# Patient Record
Sex: Female | Born: 1975
Health system: Southern US, Community
[De-identification: ages and names within clinical notes are randomized; demographics above are authoritative.]

## PROBLEM LIST (undated history)

## (undated) DIAGNOSIS — R7303 Prediabetes: Secondary | ICD-10-CM

## (undated) DIAGNOSIS — K589 Irritable bowel syndrome without diarrhea: Secondary | ICD-10-CM

## (undated) DIAGNOSIS — M2550 Pain in unspecified joint: Secondary | ICD-10-CM

## (undated) DIAGNOSIS — T7840XA Allergy, unspecified, initial encounter: Secondary | ICD-10-CM

## (undated) DIAGNOSIS — J45909 Unspecified asthma, uncomplicated: Secondary | ICD-10-CM

## (undated) DIAGNOSIS — R609 Edema, unspecified: Secondary | ICD-10-CM

## (undated) DIAGNOSIS — H409 Unspecified glaucoma: Secondary | ICD-10-CM

## (undated) DIAGNOSIS — N920 Excessive and frequent menstruation with regular cycle: Secondary | ICD-10-CM

## (undated) DIAGNOSIS — S83209A Unspecified tear of unspecified meniscus, current injury, unspecified knee, initial encounter: Secondary | ICD-10-CM

## (undated) DIAGNOSIS — R002 Palpitations: Secondary | ICD-10-CM

## (undated) DIAGNOSIS — M549 Dorsalgia, unspecified: Secondary | ICD-10-CM

## (undated) DIAGNOSIS — D649 Anemia, unspecified: Secondary | ICD-10-CM

## (undated) DIAGNOSIS — M722 Plantar fascial fibromatosis: Secondary | ICD-10-CM

## (undated) DIAGNOSIS — K5900 Constipation, unspecified: Secondary | ICD-10-CM

## (undated) DIAGNOSIS — R011 Cardiac murmur, unspecified: Secondary | ICD-10-CM

## (undated) HISTORY — DX: Unspecified tear of unspecified meniscus, current injury, unspecified knee, initial encounter: S83.209A

## (undated) HISTORY — PX: ERCP: SHX60

## (undated) HISTORY — DX: Anemia, unspecified: D64.9

## (undated) HISTORY — DX: Constipation, unspecified: K59.00

## (undated) HISTORY — DX: Excessive and frequent menstruation with regular cycle: N92.0

## (undated) HISTORY — DX: Dorsalgia, unspecified: M54.9

## (undated) HISTORY — DX: Edema, unspecified: R60.9

## (undated) HISTORY — DX: Unspecified asthma, uncomplicated: J45.909

## (undated) HISTORY — DX: Cardiac murmur, unspecified: R01.1

## (undated) HISTORY — DX: Plantar fascial fibromatosis: M72.2

## (undated) HISTORY — DX: Palpitations: R00.2

## (undated) HISTORY — DX: Prediabetes: R73.03

## (undated) HISTORY — DX: Irritable bowel syndrome, unspecified: K58.9

## (undated) HISTORY — DX: Pain in unspecified joint: M25.50

## (undated) HISTORY — PX: DILATION AND CURETTAGE OF UTERUS: SHX78

## (undated) HISTORY — PX: EYE SURGERY: SHX253

## (undated) HISTORY — PX: MENISCUS REPAIR: SHX5179

## (undated) HISTORY — DX: Allergy, unspecified, initial encounter: T78.40XA

---

## 2003-04-21 HISTORY — PX: CHOLECYSTECTOMY: SHX55

## 2015-08-19 DIAGNOSIS — D649 Anemia, unspecified: Secondary | ICD-10-CM

## 2015-08-19 HISTORY — DX: Anemia, unspecified: D64.9

## 2016-04-17 DIAGNOSIS — R300 Dysuria: Secondary | ICD-10-CM | POA: Diagnosis not present

## 2016-04-17 DIAGNOSIS — N3010 Interstitial cystitis (chronic) without hematuria: Secondary | ICD-10-CM | POA: Diagnosis not present

## 2016-04-17 DIAGNOSIS — Z8719 Personal history of other diseases of the digestive system: Secondary | ICD-10-CM | POA: Diagnosis not present

## 2016-04-25 ENCOUNTER — Encounter: Payer: Self-pay | Admitting: Emergency Medicine

## 2016-04-25 ENCOUNTER — Ambulatory Visit
Admission: EM | Admit: 2016-04-25 | Discharge: 2016-04-25 | Disposition: A | Discharge status: Home / Self Care | Payer: 59 | Attending: Emergency Medicine | Admitting: Emergency Medicine

## 2016-04-25 DIAGNOSIS — N390 Urinary tract infection, site not specified: Secondary | ICD-10-CM | POA: Diagnosis not present

## 2016-04-25 DIAGNOSIS — B373 Candidiasis of vulva and vagina: Secondary | ICD-10-CM

## 2016-04-25 DIAGNOSIS — B3731 Acute candidiasis of vulva and vagina: Secondary | ICD-10-CM

## 2016-04-25 HISTORY — DX: Unspecified glaucoma: H40.9

## 2016-04-25 LAB — CHLAMYDIA/NGC RT PCR (ARMC ONLY)
CHLAMYDIA TR: NOT DETECTED
N GONORRHOEAE: NOT DETECTED

## 2016-04-25 LAB — URINALYSIS, COMPLETE (UACMP) WITH MICROSCOPIC
BILIRUBIN URINE: NEGATIVE
Glucose, UA: NEGATIVE mg/dL
HGB URINE DIPSTICK: NEGATIVE
KETONES UR: NEGATIVE mg/dL
Nitrite: NEGATIVE
PROTEIN: NEGATIVE mg/dL
RBC / HPF: NONE SEEN RBC/hpf (ref 0–5)
SPECIFIC GRAVITY, URINE: 1.02 (ref 1.005–1.030)
pH: 7 (ref 5.0–8.0)

## 2016-04-25 LAB — WET PREP, GENITAL
Clue Cells Wet Prep HPF POC: NONE SEEN
Sperm: NONE SEEN
Trich, Wet Prep: NONE SEEN

## 2016-04-25 LAB — PREGNANCY, URINE: PREG TEST UR: NEGATIVE

## 2016-04-25 MED ORDER — SULFAMETHOXAZOLE-TRIMETHOPRIM 800-160 MG PO TABS: 1.0000 | ORAL_TABLET | Freq: Two times a day (BID) | ORAL | 0 refills | Status: AC

## 2016-04-25 MED ORDER — FLUCONAZOLE 150 MG PO TABS: 150.0000 mg | ORAL_TABLET | Freq: Every day | ORAL | 0 refills | Status: DC

## 2016-04-25 NOTE — ED Provider Notes (Signed)
[facility] ____________________________________________  Time seen: Approximately 7:37 PM  I have reviewed the triage vital signs and the nursing notes.   HISTORY  Chief Complaint Vaginal Discharge and Vaginal Itching   HPI Angela [name] is a 41 y.o. female presenting for the complaints of vaginal irritation, vaginal itching intermittent vaginal discharge. Patient for several symptoms have been present for the last 3 weeks. Patient reports approximately 2-3 weeks ago she was seen at a different urgent care and was diagnosed with urinary tract infection and bacterial vaginosis. Patient reports that she was treated with Macrobid and Flagyl. Patient reports symptoms improved but never fully resolved. Patient reports that she then had follow-up and her UTI had cleared that same week. Patient states vaginal area feels irritated. Reports has been sexually active, but denies sexual activity triggering symptoms. Reports sexually active with same partner for the last 10 years. Denies concerns of STDs. Denies abdominal pain, pelvic pain, back pain or fevers. Reports some suprapubic pressure but denies pain. Some urination irritation but denies frequency or urgency. Denies abnormal bleeding. Denies constipation or diarrhea.  Patient reports otherwise feels well. Denies other complaints.  Patient's last menstrual period was 04/06/2016 (exact date). Denies pregnancy.   Past Medical History:  Diagnosis Date  . Glaucoma     There are no active problems to display for this patient.   Past Surgical History:  Procedure Laterality Date  . EYE SURGERY      Current Outpatient Rx  . Order #: [id number] Class: Historical Med  . Order #: [id number] Class: Historical Med  . Order #: [id number] Class: Historical Med  . Order #: [id number] Class: Historical Med  . Order #: [id number] Class: Normal  . Order #: [id number] Class: Normal    No current facility-administered medications for  this encounter.   Current Outpatient Prescriptions:  .  docusate sodium (COLACE) 100 MG capsule, Take 100 mg by mouth 2 (two) times daily as needed for mild constipation., Disp: , Rfl:  .  fluticasone (FLONASE) 50 MCG/ACT nasal spray, Place 1 spray into both nostrils daily., Disp: , Rfl:  .  levobunolol (BETAGAN) 0.5 % ophthalmic solution, Place 1 drop into the right eye 2 (two) times daily., Disp: , Rfl:  .  naproxen (NAPROSYN) 500 MG tablet, Take 500 mg by mouth 2 (two) times daily with a meal., Disp: , Rfl:  .  fluconazole (DIFLUCAN) 150 MG tablet, Take 1 tablet (150 mg total) by mouth daily. Take one pill orally, then Repeat in 72 hours as needed., Disp: 2 tablet, Rfl: 0 .  sulfamethoxazole-trimethoprim (BACTRIM DS,SEPTRA DS) 800-160 MG tablet, Take 1 tablet by mouth 2 (two) times daily., Disp: 10 tablet, Rfl: 0  Allergies Patient has no known allergies.  History reviewed. No pertinent family history.  Social History Social History  Substance Use Topics  . Smoking status: Never Smoker  . Smokeless tobacco: Never Used  . Alcohol use Yes    Review of Systems Constitutional: No fever/chills Eyes: No visual changes. ENT: No sore throat. Cardiovascular: Denies chest pain. Respiratory: Denies shortness of breath. Gastrointestinal: No abdominal pain.  No nausea, no vomiting.  No diarrhea.  No constipation. Genitourinary: Negative for dysuria. As above. Musculoskeletal: Negative for back pain. Skin: Negative for rash. Neurological: Negative for headaches, focal weakness or numbness.  10-point ROS otherwise negative.  ____________________________________________   PHYSICAL EXAM:  VITAL SIGNS: ED Triage Vitals  Enc Vitals Group     BP 04/25/16 1307 127/89     Pulse Rate 04/25/16 1307 88  Resp 04/25/16 1307 16     Temp 04/25/16 1307 98.2 F (36.8 C)     Temp Source 04/25/16 1307 Oral     SpO2 04/25/16 1307 100 %     Weight 04/25/16 1304 210 lb (95.3 kg)     Height  04/25/16 1304 5\' 9"  (1.753 m)     Head Circumference --      Peak Flow --      Pain Score 04/25/16 1307 2     Pain Loc --      Pain Edu? --      Excl. in [place]? --     Constitutional: Alert and oriented. Well appearing and in no acute distress. Eyes: Conjunctivae are normal. PERRL. EOMI. ENT      Head: Normocephalic and atraumatic.      Mouth/Throat: Mucous membranes are moist. Cardiovascular: Normal rate, regular rhythm. Grossly normal heart sounds.  Good peripheral circulation. Respiratory: Normal respiratory effort without tachypnea nor retractions. Breath sounds are clear and equal bilaterally. No wheezes/rales/rhonchi.. Gastrointestinal: Soft and nontender. No distention.No CVA tenderness. Pelvic: Pelvic exam completed with Melissa CMA at bedside as chaperone. External: Normal appearance, no rash or lesion. Speculum: Mild whitish vaginal discharge, no bleeding, cervical Oz closed. No foreign bodies. Bimanual: Nontender, no cervical or adnexal tenderness. Musculoskeletal:  Ambulatory with steady gait.Marland Kitchen No midline cervical, thoracic or lumbar tenderness to palpation.  Neurologic:  Normal speech and language. Speech is normal. No gait instability.  Skin:  Skin is warm, dry and intact. No rash noted. Psychiatric: Mood and affect are normal. Speech and behavior are normal. Patient exhibits appropriate insight and judgment   ___________________________________________   LABS (all labs ordered are listed, but only abnormal results are displayed)  Labs Reviewed  WET PREP, GENITAL - Abnormal; Notable for the following:       Result Value   Yeast Wet Prep HPF POC PRESENT (*)    WBC, Wet Prep HPF POC MODERATE (*)    All other components within normal limits  URINALYSIS, COMPLETE (UACMP) WITH MICROSCOPIC - Abnormal; Notable for the following:    APPearance HAZY (*)    Leukocytes, UA SMALL (*)    Squamous Epithelial / LPF 6-30 (*)    Bacteria, UA MANY (*)    All other components within  normal limits  CHLAMYDIA/NGC RT PCR ([facility] ONLY)  URINE CULTURE  PREGNANCY, URINE    PROCEDURES Procedures   INITIAL IMPRESSION / ASSESSMENT AND PLAN / ED COURSE  Pertinent labs & imaging results that were available during my care of the patient were reviewed by me and considered in my medical decision making (see chart for details).  Well-appearing patient. No acute distress. Suspect vaginitis. Wet prep completed. Will also evaluate urinalysis.  Urinalysis reviewed in concerning for urinary tract infection. Wet prep positive for yeast as well as yeast noted in urine. Patient agreed to [facility] and chlamydia testing as well and culture urine. Discussed with patient need to have follow-up as no clear trigger per patient as well as return of symptoms. Will treat patient with oral Bactrim and Diflucan. Encouraged pelvic rest, cotton underwear, no douching, and avoidance of triggers. Discussed follow up with PCP or OB/GYN. Local information OB/GYN given. Discussed indication, risks and benefits of medications with patient.  Discussed follow up with Primary care physician this week. Discussed follow up and return parameters including no resolution or any worsening concerns. Patient verbalized understanding and agreed to plan.   ____________________________________________   FINAL CLINICAL IMPRESSION(S) / ED  DIAGNOSES  Final diagnoses:  Urinary tract infection without hematuria, site unspecified  Yeast vaginitis     Discharge Medication List as of 04/25/2016  3:44 PM    START taking these medications   Details  fluconazole (DIFLUCAN) 150 MG tablet Take 1 tablet (150 mg total) by mouth daily. Take one pill orally, then Repeat in 72 hours as needed., Starting Sat 04/25/2016, Normal    sulfamethoxazole-trimethoprim (BACTRIM DS,SEPTRA DS) 800-160 MG tablet Take 1 tablet by mouth 2 (two) times daily., Starting Sat 04/25/2016, Until Thu 04/30/2016, Normal        Note: This dictation was  prepared with Dragon dictation along with smaller phrase technology. Any transcriptional errors that result from this process are unintentional.    Clinical Course       Marylene Land, NP 04/25/16 1943

## 2016-04-25 NOTE — ED Triage Notes (Signed)
Patient c/o vaginal discharge and itching for 3 weeks.  Patient was diagnosed with BV and UTI and was treated for both.  Patient states that her symptoms started back about 2 weeks ago.  Patient reports white vaginal discharge.

## 2016-04-25 NOTE — Discharge Instructions (Signed)
Take medication as prescribed. Rest. Drink plenty of fluids.  ° °Follow up with your primary care physician this week as needed. Return to Urgent care for new or worsening concerns.  ° °

## 2016-04-26 ENCOUNTER — Telehealth: Payer: Self-pay | Admitting: *Deleted

## 2016-04-26 NOTE — Telephone Encounter (Signed)
Called patient, verified DOB, communicated negative gonorrhea and chlamydia test results. Patient confirmed understanding of test results.

## 2016-04-27 LAB — URINE CULTURE

## 2016-07-08 ENCOUNTER — Encounter: Payer: Self-pay | Admitting: Obstetrics and Gynecology

## 2016-07-08 ENCOUNTER — Ambulatory Visit (INDEPENDENT_AMBULATORY_CARE_PROVIDER_SITE_OTHER): Payer: 59 | Admitting: Obstetrics and Gynecology

## 2016-07-08 VITALS — BP 124/78 | Ht 69.0 in | Wt 213.0 lb

## 2016-07-08 DIAGNOSIS — Z01419 Encounter for gynecological examination (general) (routine) without abnormal findings: Secondary | ICD-10-CM | POA: Diagnosis not present

## 2016-07-08 DIAGNOSIS — N898 Other specified noninflammatory disorders of vagina: Secondary | ICD-10-CM

## 2016-07-08 DIAGNOSIS — Z124 Encounter for screening for malignant neoplasm of cervix: Secondary | ICD-10-CM

## 2016-07-08 LAB — POCT WET PREP WITH KOH
CLUE CELLS WET PREP PER HPF POC: NEGATIVE
PH: 4.5
TRICHOMONAS UA: NEGATIVE
Yeast Wet Prep HPF POC: NEGATIVE

## 2016-07-08 NOTE — Progress Notes (Signed)
Gynecology Annual Exam  PCP: No PCP Per Patient  Chief Complaint  Patient presents with  . Annual Exam    History of Present Illness:  [name] is a 41 y.o. G0P0000 who LMP was Patient's last menstrual period was 07/02/2016., presents today for her annual examination.  Her menses are regular every 28-30 days, lasting 5 day(s).  Dysmenorrhea none. She does not have intermenstrual bleeding.  She is single partner, contraception - none and reports her husband is infertile. No issues with intercourse.   Last Pap: 1 year ago, ASCUS, HPV negative Hx of STDs: none  Last mammogram: 1 year, Birads 1 There is no FH of breast cancer. There is no FH of ovarian cancer. The patient does not do self-breast exams.  Colonoscopy: n/a DEXA: n/a  Tobacco use: The patient denies current or previous tobacco use. Alcohol use: social drinker Exercise: moderately active  Reports a stinging/burning with urinating. Treated for UTI in January, had yeast infection after. That was treated. No other urinary symptoms today.  The patient is sexually active. The patient wears seatbelts: yes.     Review of Systems: Review of Systems  Constitutional: Negative.   HENT: Negative.   Respiratory: Negative.   Cardiovascular: Negative.   Gastrointestinal: Negative.   Genitourinary: Positive for dysuria.  Musculoskeletal: Negative.   Skin: Negative.   Neurological: Negative.   Psychiatric/Behavioral: Negative.     Past Medical History:  Diagnosis Date  . Glaucoma    Past Surgical History:  Procedure Laterality Date  . EYE SURGERY      Medications:   Medication Sig Start Date End Date Taking? Authorizing Provider  fluticasone (FLONASE) 50 MCG/ACT nasal spray Place 1 spray into both nostrils daily.   Yes Historical Provider, MD  levobunolol (BETAGAN) 0.5 % ophthalmic solution Place 1 drop into the right eye 2 (two) times daily.   Yes Historical Provider, MD  docusate sodium (COLACE)  100 MG capsule Take 100 mg by mouth 2 (two) times daily as needed for mild constipation.    Historical Provider, MD  fluconazole (DIFLUCAN) 150 MG tablet Take 1 tablet (150 mg total) by mouth daily. Take one pill orally, then Repeat in 72 hours as needed. Patient not taking: Reported on 07/08/2016 04/25/16   Marylene Land, NP  naproxen (NAPROSYN) 500 MG tablet Take 500 mg by mouth 2 (two) times daily with a meal.    Historical Provider, MD    Allergies: No Known Allergies  Gynecologic History:  Patient's last menstrual period was 07/02/2016. Contraception: reports husband is sterile  Obstetric History: G0P0000  Family History  Problem Relation Age of Onset  . Colon cancer Mother   . Diabetes Mother   . Hypertension Mother   . Diabetes Father   . Esophageal cancer Maternal Grandmother    Social History   Social History  . Marital status: Married    Spouse name: N/A  . Number of children: N/A  . Years of education: N/A   Occupational History  . Not on file.   Social History Main Topics  . Smoking status: Never Smoker  . Smokeless tobacco: Never Used  . Alcohol use Yes  . Drug use: No  . Sexual activity: Yes    Birth control/ protection: None   Other Topics Concern  . Not on file   Social History Narrative  . No narrative on file    Physical Exam Vitals: Blood pressure 124/78, height 5\' 9"  (1.753 m), weight 213 lb (96.6 kg),  last menstrual period 07/02/2016. Physical Exam  Constitutional: She is oriented to person, place, and time and well-developed, well-nourished, and in no distress. No distress.  HENT:  Head: Normocephalic and atraumatic.  Eyes: Conjunctivae are normal. Left eye exhibits no discharge. No scleral icterus.  Neck: Normal range of motion. Neck supple.  Cardiovascular: Normal rate and regular rhythm.  Exam reveals no gallop and no friction rub.   No murmur heard. Pulmonary/Chest: Effort normal and breath sounds normal. No respiratory distress. She  has no wheezes. She has no rales.  Abdominal: Soft. Bowel sounds are normal. She exhibits no distension and no mass. There is no tenderness. There is no rebound and no guarding.  Genitourinary: Vagina normal, uterus normal, cervix normal, right adnexa normal, left adnexa normal and vulva normal.  Musculoskeletal: Normal range of motion. She exhibits no edema.  Lymphadenopathy:    She has no cervical adenopathy.  Neurological: She is alert and oriented to person, place, and time. No cranial nerve deficit.  Skin: Skin is warm and dry. No rash noted.  Psychiatric: Judgment normal.     Female chaperone present for pelvic and breast  portions of the physical exam  Results: AUDIT Questionnaire (screen for alcoholism): 2 PHQ-9: 1   Wet Prep: PH: 4.5 Clue cells: absent Fungal elements: absent Trichomonas: absent  Assessment: 41 y.o. G0P0000 here for annual gynecologic exam, doing well.  Plan: 1) Mammogram - recommend yearly screening mammogram  2) STI screening was offered performed  3) ASCCP guidelines and rational discussed.  Patient opts for routine screening interval. Pap smear performed today.  4) Contraception - she understands that while her husband has low motility, she may be at risk for pregnancy still. She is fine with that.   5) Routine healthcare maintenance including cholesterol, diabetes screening no indicated today.   Prentice Docker, MD 07/08/2016 10:33 AM

## 2016-07-13 ENCOUNTER — Encounter: Payer: Self-pay | Admitting: Obstetrics and Gynecology

## 2016-07-13 LAB — IGP, APTIMA HPV, RFX 16/18,45
HPV APTIMA: NEGATIVE
PAP Smear Comment: 0

## 2016-08-31 ENCOUNTER — Encounter: Payer: Self-pay | Admitting: Family Medicine

## 2016-08-31 ENCOUNTER — Ambulatory Visit (INDEPENDENT_AMBULATORY_CARE_PROVIDER_SITE_OTHER): Payer: 59 | Admitting: Family Medicine

## 2016-08-31 VITALS — BP 118/82 | HR 76 | Temp 98.4°F | Ht 68.0 in | Wt 218.2 lb

## 2016-08-31 DIAGNOSIS — E669 Obesity, unspecified: Secondary | ICD-10-CM | POA: Diagnosis not present

## 2016-08-31 DIAGNOSIS — R002 Palpitations: Secondary | ICD-10-CM

## 2016-08-31 DIAGNOSIS — D500 Iron deficiency anemia secondary to blood loss (chronic): Secondary | ICD-10-CM

## 2016-08-31 DIAGNOSIS — L83 Acanthosis nigricans: Secondary | ICD-10-CM

## 2016-08-31 DIAGNOSIS — R220 Localized swelling, mass and lump, head: Secondary | ICD-10-CM

## 2016-08-31 LAB — CBC
HEMATOCRIT: 31.8 % — AB (ref 36.0–46.0)
HEMOGLOBIN: 10.6 g/dL — AB (ref 12.0–15.0)
MCHC: 33.4 g/dL (ref 30.0–36.0)
MCV: 81.4 fl (ref 78.0–100.0)
PLATELETS: 205 10*3/uL (ref 150.0–400.0)
RBC: 3.91 Mil/uL (ref 3.87–5.11)
RDW: 18.5 % — ABNORMAL HIGH (ref 11.5–15.5)
WBC: 5.7 10*3/uL (ref 4.0–10.5)

## 2016-08-31 LAB — COMPREHENSIVE METABOLIC PANEL
ALBUMIN: 3.9 g/dL (ref 3.5–5.2)
ALK PHOS: 60 U/L (ref 39–117)
ALT: 26 U/L (ref 0–35)
AST: 24 U/L (ref 0–37)
BUN: 11 mg/dL (ref 6–23)
CALCIUM: 9.1 mg/dL (ref 8.4–10.5)
CHLORIDE: 107 meq/L (ref 96–112)
CO2: 28 mEq/L (ref 19–32)
Creatinine, Ser: 0.64 mg/dL (ref 0.40–1.20)
GFR: 130.97 mL/min (ref >60.00)
Glucose, Bld: 86 mg/dL (ref 70–99)
POTASSIUM: 4.4 meq/L (ref 3.5–5.1)
Sodium: 138 mEq/L (ref 135–145)
TOTAL PROTEIN: 6.8 g/dL (ref 6.0–8.3)
Total Bilirubin: 0.4 mg/dL (ref 0.2–1.2)

## 2016-08-31 LAB — LIPID PANEL
Cholesterol: 139 mg/dL (ref 0–200)
HDL: 51.3 mg/dL (ref >39.00)
LDL CALC: 58 mg/dL (ref 0–99)
NonHDL: 87.99
TRIGLYCERIDES: 148 mg/dL (ref 0.0–149.0)
Total CHOL/HDL Ratio: 3
VLDL: 29.6 mg/dL (ref 0.0–40.0)

## 2016-08-31 LAB — HEMOGLOBIN A1C: Hgb A1c MFr Bld: 5.8 % (ref 4.6–6.5)

## 2016-08-31 NOTE — Patient Instructions (Signed)
We will arrange the Ultrasound and the referral.  Take care  Dr. Lacinda Axon

## 2016-09-01 DIAGNOSIS — R002 Palpitations: Secondary | ICD-10-CM | POA: Insufficient documentation

## 2016-09-01 DIAGNOSIS — R220 Localized swelling, mass and lump, head: Secondary | ICD-10-CM | POA: Insufficient documentation

## 2016-09-01 NOTE — Assessment & Plan Note (Signed)
New problem. Uncertain etiology/prognosis this time. The area of concern appears firm and hard and is possibly a portion of the skull. It is not movable. It does not appear to be in the skin/subcutaneous tissue. Arranging ultrasound for further elucidation to help determine where she will be referred to for removal as this is what she desires.

## 2016-09-01 NOTE — Assessment & Plan Note (Signed)
New problem. Patient in sinus rhythm today. Unsure whether this is simply sinus tachycardia or underlying arrhythmia. She has no red flags. Sending to cardiology for Holter.

## 2016-09-01 NOTE — Progress Notes (Signed)
Subjective:  Patient ID: Angela [name], female    DOB: 25-Nov-1975  Age: 41 y.o. MRN: [id number]  CC: Establish care - Palpitations, knot on forehead  HPI Angela [name] is a 41 y.o. female presents to the clinic today with the above complaints.  Palpitations  Patient reports a two-month history of intermittent palpitations.  No reports of irregularity.   She feels like her heart is beating fast/racing.  No associated chest pain or shortness of breath.   No known exacerbating or relieving factors.  No other associated symptoms.  Knot on forehead  Patient reports that she has had a knot/bump on the left side of her forehead for the past year.  She assumed that it was assisted would go away.  He continues to persist.  Somewhat tender.  Is a cosmetic issue for her.  She is unsure of the etiology and would like to discuss removal.  PMH, Surgical Hx, Family Hx, Social History reviewed and updated as below.  Past Medical History:  Diagnosis Date  . Allergy Enviromental  . Anemia may/2017  . Glaucoma   . Heart murmur childhood   Past Surgical History:  Procedure Laterality Date  . CHOLECYSTECTOMY  2005  . EYE SURGERY     Family History  Problem Relation Age of Onset  . Colon cancer Mother   . Diabetes Mother   . Hypertension Mother   . Arthritis Mother   . Diabetes Father   . Esophageal cancer Maternal Grandmother   . Cancer Maternal Grandmother   . Diabetes Maternal Grandmother   . Alcohol abuse Maternal Grandfather    Social History  Substance Use Topics  . Smoking status: Never Smoker  . Smokeless tobacco: Never Used  . Alcohol use Yes    2 Glasses of wine per week    Review of Systems  Cardiovascular: Positive for palpitations.  Gastrointestinal: Positive for constipation.  Neurological: Positive for headaches.  All other systems reviewed and are negative.   Objective:   Today's Vitals: BP 118/82   Pulse 76   Temp 98.4  F (36.9 C) (Oral)   Ht 5\' 8"  (1.727 m)   Wt 218 lb 4 oz (99 kg)   SpO2 98%   BMI 33.18 kg/m   Physical Exam  Constitutional: She is oriented to person, place, and time. She appears well-developed. No distress.  HENT:  Head: Normocephalic and atraumatic.  Mouth/Throat: Oropharynx is clear and moist.  Left side of forehead with a palpable nodule. Does not appear to be in the skin. Non moveable. Is very hard and firm.  Eyes: Conjunctivae are normal. No scleral icterus.  Neck: Neck supple. No thyromegaly present.  Cardiovascular: Normal rate and regular rhythm.   Pulmonary/Chest: Effort normal and breath sounds normal.  Abdominal: Soft. She exhibits no distension. There is no tenderness. There is no rebound and no guarding.  Musculoskeletal: Normal range of motion.  Lymphadenopathy:    She has no cervical adenopathy.  Neurological: She is alert and oriented to person, place, and time.  Skin:  No rash.  Psychiatric: She has a normal mood and affect.  Vitals reviewed.  Assessment & Plan:   Problem List Items Addressed This Visit    Palpitations - Primary    New problem. Patient in sinus rhythm today. Unsure whether this is simply sinus tachycardia or underlying arrhythmia. She has no red flags. Sending to cardiology for Holter.      Relevant Orders   Ambulatory referral to Cardiology  Localized swelling, mass and lump, head    New problem. Uncertain etiology/prognosis this time. The area of concern appears firm and hard and is possibly a portion of the skull. It is not movable. It does not appear to be in the skin/subcutaneous tissue. Arranging ultrasound for further elucidation to help determine where she will be referred to for removal as this is what she desires.      Relevant Orders   [place] Head    Other Visit Diagnoses    Iron deficiency anemia due to chronic blood loss       Relevant Orders   CBC (Completed)   Acanthosis nigricans       Relevant Orders    Hemoglobin A1c (Completed)   Obesity (BMI 30.0-34.9)       Relevant Orders   Comprehensive metabolic panel (Completed)   Lipid panel (Completed)     Follow-up: PRN  [place]

## 2016-09-02 ENCOUNTER — Other Ambulatory Visit: Payer: Self-pay | Admitting: Family Medicine

## 2016-09-02 DIAGNOSIS — D649 Anemia, unspecified: Secondary | ICD-10-CM

## 2016-09-08 ENCOUNTER — Other Ambulatory Visit (INDEPENDENT_AMBULATORY_CARE_PROVIDER_SITE_OTHER): Payer: 59

## 2016-09-08 DIAGNOSIS — D649 Anemia, unspecified: Secondary | ICD-10-CM | POA: Diagnosis not present

## 2016-09-08 LAB — VITAMIN B12: VITAMIN B 12: 484 pg/mL (ref 211–911)

## 2016-09-08 LAB — FOLATE: Folate: 13 ng/mL (ref >5.9)

## 2016-09-09 ENCOUNTER — Other Ambulatory Visit: Payer: Self-pay | Admitting: Family Medicine

## 2016-09-09 ENCOUNTER — Ambulatory Visit: Payer: 59

## 2016-09-09 LAB — IRON,TIBC AND FERRITIN PANEL
%SAT: 7 % — AB (ref 11–50)
Ferritin: 7 ng/mL — ABNORMAL LOW (ref 10–232)
IRON: 30 ug/dL — AB (ref 40–190)
TIBC: 401 ug/dL (ref 250–450)

## 2016-09-09 MED ORDER — FERROUS SULFATE 325 (65 FE) MG PO TABS: 325.0000 mg | ORAL_TABLET | Freq: Two times a day (BID) | ORAL | 3 refills | Status: DC

## 2016-09-21 ENCOUNTER — Ambulatory Visit
Admission: RE | Admit: 2016-09-21 | Discharge: 2016-09-21 | Disposition: A | Discharge status: Home / Self Care | Payer: 59 | Source: Ambulatory Visit | Attending: Family Medicine | Admitting: Family Medicine

## 2016-09-21 DIAGNOSIS — R220 Localized swelling, mass and lump, head: Secondary | ICD-10-CM | POA: Diagnosis not present

## 2016-09-22 ENCOUNTER — Other Ambulatory Visit: Payer: Self-pay | Admitting: Family Medicine

## 2016-09-22 DIAGNOSIS — R220 Localized swelling, mass and lump, head: Secondary | ICD-10-CM

## 2016-09-29 ENCOUNTER — Ambulatory Visit (INDEPENDENT_AMBULATORY_CARE_PROVIDER_SITE_OTHER): Payer: 59 | Admitting: Cardiovascular Disease

## 2016-09-29 ENCOUNTER — Encounter: Payer: Self-pay | Admitting: Cardiovascular Disease

## 2016-09-29 VITALS — BP 140/80 | HR 74 | Ht 69.0 in | Wt 213.0 lb

## 2016-09-29 DIAGNOSIS — R002 Palpitations: Secondary | ICD-10-CM

## 2016-09-29 NOTE — Progress Notes (Signed)
Cardiology Office Note   Date:  09/29/2016   ID:  Angela [name], DOB 02-26-1976, MRN [id number]  PCP:  Angela Spikes, DO  Cardiologist:   Angela Sacramento, MD   Chief Complaint  Patient presents with  . other    Ref by Dr. Lacinda Axon for palpitations. Pt. c/o palpitations more frequently with a headache. Meds reviewed by the pt. verbally.       History of Present Illness: Angela [name] is a 41 y.o. female who was referred by Dr. Lacinda Axon for evaluation of palpitations. 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 She was told in the past about a heart murmur when she was a child but she has no other cardiac history. She has no significant chronic medical conditions. She is not a smoker. There is no family history of premature coronary artery disease or arrhythmia. She describes mild and rare palpitations associated with headache. No other cardiac symptoms. Specifically, no chest pain, shortness of breath, syncope or presyncope. She consumes only one cup of coffee daily. No tachycardia.    Past Medical History:  Diagnosis Date  . Allergy Enviromental  . Anemia may/2017  . Glaucoma   . Heart murmur childhood    Past Surgical History:  Procedure Laterality Date  . CHOLECYSTECTOMY  2005  . EYE SURGERY       Current Outpatient Prescriptions  Medication Sig Dispense Refill  . docusate sodium (COLACE) 100 MG capsule Take 100 mg by mouth 2 (two) times daily as needed for mild constipation.    . ferrous sulfate 325 (65 FE) MG tablet Take 1 tablet (325 mg total) by mouth 2 (two) times daily. 60 tablet 3  . fluticasone (FLONASE) 50 MCG/ACT nasal spray Place 1 spray into both nostrils daily.    Marland Kitchen ibuprofen (ADVIL,MOTRIN) 200 MG tablet Take 200 mg by mouth as needed.    Marland Kitchen levobunolol (BETAGAN) 0.5 % ophthalmic solution Place 1 drop into the right eye 2 (two) times daily.     No current facility-administered medications for this visit.     Allergies:   Patient has no  known allergies.    Social History:  The patient  reports that she has never smoked. She has never used smokeless tobacco. She reports that she drinks alcohol. She reports that she does not use drugs.   Family History:  The patient's family history includes Alcohol abuse in her maternal grandfather; Arthritis in her mother; Cancer in her maternal grandmother; Colon cancer in her mother; Diabetes in her father, maternal grandmother, and mother; Esophageal cancer in her maternal grandmother; Hypertension in her mother.    ROS:  Please see the history of present illness.   Otherwise, review of systems are positive for none.   All other systems are reviewed and negative.    PHYSICAL EXAM: VS:  BP 140/80 (BP Location: Right Arm, Patient Position: Sitting, Cuff Size: Normal)   Pulse 74   Ht 5\' 9"  (1.753 m)   Wt 213 lb (96.6 kg)   BMI 31.45 kg/m  , BMI Body mass index is 31.45 kg/m. GEN: Well nourished, well developed, in no acute distress  HEENT: normal  Neck: no JVD, carotid bruits, or masses Cardiac: RRR; no murmurs, rubs, or gallops,no edema  Respiratory:  clear to auscultation bilaterally, normal work of breathing GI: soft, nontender, nondistended, + BS MS: no deformity or atrophy  Skin: warm and dry, no rash Neuro:  Strength and sensation are intact Psych: euthymic mood, full  affect   EKG:  EKG is ordered today. The ekg ordered today demonstrates normal sinus rhythm with no significant ST or T wave changes.   Recent Labs: 08/31/2016: ALT 26; BUN 11; Creatinine, Ser 0.64; Hemoglobin 10.6; Platelets 205.0; Potassium 4.4; Sodium 138    Lipid Panel    Component Value Date/Time   CHOL 139 08/31/2016 1113   TRIG 148.0 08/31/2016 1113   HDL 51.30 08/31/2016 1113   CHOLHDL 3 08/31/2016 1113   VLDL 29.6 08/31/2016 1113   LDLCALC 58 08/31/2016 1113      Wt Readings from Last 3 Encounters:  09/29/16 213 lb (96.6 kg)  08/31/16 218 lb 4 oz (99 kg)  07/08/16 213 lb (96.6 kg)        PAD Screen 09/29/2016  Previous PAD dx? No  Previous surgical procedure? No  Pain with walking? No  Feet/toe relief with dangling? No  Painful, non-healing ulcers? No  Extremities discolored? No      ASSESSMENT AND PLAN:  1.  Palpitations: Suggestive of premature beats. The history is overall benign with no significant findings on cardiac exam and normal baseline EKG. Given that her symptoms are not very frequent, I think that the yield of Holter monitoring is very low overall. I suggested that she considers a Smart phone telemetry device which she can keep all the time and record 1 over she feels these episodes. She does not consume excessive amount of caffeine. She has no symptoms of angina or heart failure.    Disposition:   FU with me as needed.   Signed,  Angela Sacramento, MD  09/29/2016 3:00 PM    [place]

## 2016-09-29 NOTE — Patient Instructions (Signed)
Medication Instructions: No changes.   Labwork: None.   Procedures/Testing: None.   Follow-Up: Follow up as needed.   Any Additional Special Instructions Will Be Listed Below (If Applicable).     If you need a refill on your cardiac medications before your next appointment, please call your pharmacy.

## 2016-10-01 ENCOUNTER — Ambulatory Visit: Payer: 59

## 2016-10-09 ENCOUNTER — Ambulatory Visit
Admission: RE | Admit: 2016-10-09 | Discharge: 2016-10-09 | Disposition: A | Discharge status: Home / Self Care | Payer: 59 | Source: Ambulatory Visit | Attending: Family Medicine | Admitting: Family Medicine

## 2016-10-09 DIAGNOSIS — J329 Chronic sinusitis, unspecified: Secondary | ICD-10-CM | POA: Diagnosis not present

## 2016-10-09 DIAGNOSIS — R51 Headache: Secondary | ICD-10-CM | POA: Diagnosis not present

## 2016-10-09 DIAGNOSIS — R220 Localized swelling, mass and lump, head: Secondary | ICD-10-CM | POA: Insufficient documentation

## 2016-10-20 DIAGNOSIS — H4031X3 Glaucoma secondary to eye trauma, right eye, severe stage: Secondary | ICD-10-CM | POA: Diagnosis not present

## 2016-10-20 DIAGNOSIS — H524 Presbyopia: Secondary | ICD-10-CM | POA: Diagnosis not present

## 2016-11-10 ENCOUNTER — Ambulatory Visit: Payer: 59 | Admitting: Internal Medicine

## 2016-12-28 MED FILL — LEVOBUNOLOL 0.5% EYE DROPS: 0.5 | 30 days supply | Qty: 5 | Fill #0

## 2016-12-29 ENCOUNTER — Other Ambulatory Visit: Payer: Self-pay | Admitting: Obstetrics and Gynecology

## 2016-12-29 DIAGNOSIS — Z1231 Encounter for screening mammogram for malignant neoplasm of breast: Secondary | ICD-10-CM

## 2017-01-19 ENCOUNTER — Ambulatory Visit
Admission: RE | Admit: 2017-01-19 | Discharge: 2017-01-19 | Disposition: A | Discharge status: Home / Self Care | Payer: 59 | Source: Ambulatory Visit | Attending: Obstetrics and Gynecology | Admitting: Obstetrics and Gynecology

## 2017-01-19 ENCOUNTER — Encounter: Payer: Self-pay | Admitting: Physician Assistant

## 2017-01-19 DIAGNOSIS — Z1231 Encounter for screening mammogram for malignant neoplasm of breast: Secondary | ICD-10-CM | POA: Diagnosis not present

## 2017-02-01 MED FILL — LEVOBUNOLOL 0.5% EYE DROPS: 0.5 | 90 days supply | Qty: 10 | Fill #1

## 2017-02-19 ENCOUNTER — Ambulatory Visit: Payer: 59 | Admitting: Physician Assistant

## 2017-03-15 MED FILL — ACETAMINOPHEN/COD #3 TABLET: 300-30 | 1 days supply | Qty: 6 | Fill #0

## 2017-03-15 MED FILL — diazePAM 5 MG TABS: 5 | 1 days supply | Qty: 1 | Fill #0

## 2017-03-15 MED FILL — CHLORHEXIDINE 0.12% RINSE: 0.12 | 10 days supply | Qty: 473 | Fill #0

## 2017-05-10 DIAGNOSIS — H4031X3 Glaucoma secondary to eye trauma, right eye, severe stage: Secondary | ICD-10-CM | POA: Diagnosis not present

## 2017-06-21 ENCOUNTER — Other Ambulatory Visit: Payer: Self-pay | Admitting: Primary Care

## 2017-06-21 ENCOUNTER — Ambulatory Visit (INDEPENDENT_AMBULATORY_CARE_PROVIDER_SITE_OTHER): Payer: 59 | Admitting: Primary Care

## 2017-06-21 ENCOUNTER — Ambulatory Visit: Payer: 59 | Admitting: Primary Care

## 2017-06-21 ENCOUNTER — Encounter: Payer: Self-pay | Admitting: Primary Care

## 2017-06-21 VITALS — BP 118/74 | HR 79 | Temp 98.4°F | Ht 68.0 in | Wt 218.5 lb

## 2017-06-21 DIAGNOSIS — R7303 Prediabetes: Secondary | ICD-10-CM

## 2017-06-21 DIAGNOSIS — D509 Iron deficiency anemia, unspecified: Secondary | ICD-10-CM

## 2017-06-21 DIAGNOSIS — R300 Dysuria: Secondary | ICD-10-CM | POA: Diagnosis not present

## 2017-06-21 DIAGNOSIS — H409 Unspecified glaucoma: Secondary | ICD-10-CM | POA: Diagnosis not present

## 2017-06-21 LAB — COMPREHENSIVE METABOLIC PANEL
ALBUMIN: 3.7 g/dL (ref 3.5–5.2)
ALT: 25 U/L (ref 0–35)
AST: 21 U/L (ref 0–37)
Alkaline Phosphatase: 75 U/L (ref 39–117)
BUN: 10 mg/dL (ref 6–23)
CALCIUM: 9.3 mg/dL (ref 8.4–10.5)
CHLORIDE: 106 meq/L (ref 96–112)
CO2: 27 mEq/L (ref 19–32)
Creatinine, Ser: 0.64 mg/dL (ref 0.40–1.20)
GFR: 130.47 mL/min (ref >60.00)
Glucose, Bld: 99 mg/dL (ref 70–99)
Potassium: 3.9 mEq/L (ref 3.5–5.1)
SODIUM: 138 meq/L (ref 135–145)
Total Bilirubin: 0.3 mg/dL (ref 0.2–1.2)
Total Protein: 7.3 g/dL (ref 6.0–8.3)

## 2017-06-21 LAB — POC URINALSYSI DIPSTICK (AUTOMATED)
BILIRUBIN UA: NEGATIVE
Blood, UA: NEGATIVE
GLUCOSE UA: NEGATIVE
KETONES UA: NEGATIVE
LEUKOCYTES UA: NEGATIVE
Nitrite, UA: NEGATIVE
PH UA: 6 (ref 5.0–8.0)
Protein, UA: NEGATIVE
Spec Grav, UA: 1.025 (ref 1.010–1.025)
Urobilinogen, UA: 0.2 E.U./dL

## 2017-06-21 LAB — CBC
HCT: 31 % — ABNORMAL LOW (ref 36.0–46.0)
Hemoglobin: 10.2 g/dL — ABNORMAL LOW (ref 12.0–15.0)
MCHC: 32.8 g/dL (ref 30.0–36.0)
MCV: 76.2 fl — AB (ref 78.0–100.0)
PLATELETS: 268 10*3/uL (ref 150.0–400.0)
RBC: 4.07 Mil/uL (ref 3.87–5.11)
RDW: 17.4 % — ABNORMAL HIGH (ref 11.5–15.5)
WBC: 5.6 10*3/uL (ref 4.0–10.5)

## 2017-06-21 LAB — IBC PANEL
IRON: 34 ug/dL — AB (ref 42–145)
Saturation Ratios: 7.1 % — ABNORMAL LOW (ref 20.0–50.0)
TRANSFERRIN: 340 mg/dL (ref 212.0–360.0)

## 2017-06-21 LAB — HEMOGLOBIN A1C: Hgb A1c MFr Bld: 5.8 % (ref 4.6–6.5)

## 2017-06-21 MED ORDER — FERROUS SULFATE 325 (65 FE) MG PO TABS: 325.0000 mg | ORAL_TABLET | Freq: Every day | ORAL | 0 refills | Status: DC

## 2017-06-21 NOTE — Assessment & Plan Note (Signed)
Diagnosed in childhood, compliant to levobunolol.

## 2017-06-21 NOTE — Assessment & Plan Note (Signed)
No use of oral iron in 6+ months.  Repeat CBC and Iron studies pending.  Will likely resume oral iron if necessary, continue GYN follow up.

## 2017-06-21 NOTE — Progress Notes (Signed)
Subjective:    Patient ID: Angela [name], female    DOB: 09-19-1975, 42 y.o.   MRN: [id number]  HPI  Angela [name] is a 42 year old female who presents today to transfer care from [facility]. Will review old records.  1) Iron Deficiency: Noted in May 2018 with hemoglobin of 10.6 with low iron and ferritin levels. Her bleeding was thought to be secondary to heavy menstrual cycles so she was referred to GYN for further evaluation.   She states that she was seen by GYN a week before her visit in May 2018 and her gynecologist wasn't worried about her menorrhagia. She's not had follow up since her visit in May 2018. She's not taken any oral iron since that time as well. She does continue to experience menorrhagia with menstrual cycles.   2) Prediabetes: A1C of 5.8 in May 2018. She's not had follow up since her diagnosis. She endorses the discoloration to her neck line since childhood. Strong family history of diabetes in mother and father.   3) Glaucoma: Diagnosed in childhood after an accident. Currently following with [facility] and is managed on levobunolol.   4) Dysuria: Occurs after urination, also with some vaginal itching. She does have a history of UTI's. She denies vaginal discharge, hematuria, flank pain, fevers, abdominal pain, nausea.   Review of Systems  Constitutional: Negative for fatigue.  Respiratory: Negative for shortness of breath.   Cardiovascular: Negative for chest pain.  Genitourinary: Positive for dysuria. Negative for flank pain, frequency, hematuria and vaginal discharge.       Vaginal itching  Neurological: Negative for numbness.       Past Medical History:  Diagnosis Date  . Allergy Enviromental  . Anemia may/2017  . Glaucoma   . Heart murmur childhood  . Prediabetes      Social History   Socioeconomic History  . Marital status: Married    Spouse name: Not on file  . Number of children: Not on file  . Years of  education: Not on file  . Highest education level: Not on file  Social Needs  . Financial resource strain: Not on file  . Food insecurity - worry: Not on file  . Food insecurity - inability: Not on file  . Transportation needs - medical: Not on file  . Transportation needs - non-medical: Not on file  Occupational History  . Not on file  Tobacco Use  . Smoking status: Never Smoker  . Smokeless tobacco: Never Used  Substance and Sexual Activity  . Alcohol use: Yes    Types: 2 Glasses of wine per week  . Drug use: No  . Sexual activity: Yes    Birth control/protection: None  Other Topics Concern  . Not on file  Social History Narrative   Married.   No children.   Works in the [employer].   Enjoys watching movies, walking, reading.     Past Surgical History:  Procedure Laterality Date  . CHOLECYSTECTOMY  2005  . EYE SURGERY    . MENISCUS REPAIR Right    x 2    Family History  Problem Relation Age of Onset  . Colon cancer Mother 70  . Diabetes Mother   . Hypertension Mother   . Arthritis Mother   . Diabetes Father   . Heart disease Father   . Esophageal cancer Maternal Grandmother   . Cancer Maternal Grandmother   . Diabetes Maternal Grandmother   . Alcohol abuse Maternal Grandfather   .  Breast cancer Neg Hx     No Known Allergies  Current Outpatient Medications on File Prior to Visit  Medication Sig Dispense Refill  . fluticasone (FLONASE) 50 MCG/ACT nasal spray Place 1 spray into both nostrils daily.    Marland Kitchen ibuprofen (ADVIL,MOTRIN) 200 MG tablet Take 200 mg by mouth as needed.    Marland Kitchen levobunolol (BETAGAN) 0.5 % ophthalmic solution Place 1 drop into the right eye 2 (two) times daily.    . Psyllium (METAMUCIL FIBER PO)      No current facility-administered medications on file prior to visit.     BP 118/74   Pulse 79   Temp 98.4 F (36.9 C) (Oral)   Ht 5\' 8"  (1.727 m)   Wt 218 lb 8 oz (99.1 kg)   LMP 06/10/2017   SpO2 98%   BMI 33.22 kg/m     Objective:   Physical Exam  Constitutional: She appears well-nourished.  Neck: Neck supple.  Cardiovascular: Normal rate and regular rhythm.  Pulmonary/Chest: Effort normal and breath sounds normal.  Genitourinary: Cervix exhibits no motion tenderness and no discharge. No vaginal discharge found.  Genitourinary Comments: Mild erythema to cervix, dryness  Skin: Skin is warm and dry.  Acanthosis nigricans   Psychiatric: She has a normal mood and affect.          Assessment & Plan:  Dysuria:  Also with vaginal itching, after urination.  UA today: Negative for leuks, nitrites, blood. Wet prep sent off off for testing.  Push intake of water. Will await results.  Pleas Koch, NP

## 2017-06-21 NOTE — Assessment & Plan Note (Signed)
Repeat A1C pending. Does have strong family history of diabetes and presence of acanthosis nigricans.

## 2017-06-21 NOTE — Patient Instructions (Addendum)
Stop by the lab prior to leaving today. I will notify you of your results once received.   We'll be in touch once we receive your vaginal swab results.  Push intake of water for now.   It was a pleasure meeting you!

## 2017-06-22 ENCOUNTER — Other Ambulatory Visit: Payer: Self-pay | Admitting: Primary Care

## 2017-06-22 DIAGNOSIS — B3731 Acute candidiasis of vulva and vagina: Secondary | ICD-10-CM

## 2017-06-22 DIAGNOSIS — B373 Candidiasis of vulva and vagina: Secondary | ICD-10-CM

## 2017-06-22 DIAGNOSIS — N760 Acute vaginitis: Principal | ICD-10-CM

## 2017-06-22 DIAGNOSIS — B9689 Other specified bacterial agents as the cause of diseases classified elsewhere: Secondary | ICD-10-CM

## 2017-06-22 LAB — WET PREP BY MOLECULAR PROBE
Candida species: DETECTED — AB
MICRO NUMBER:: 90275420
SPECIMEN QUALITY: ADEQUATE
TRICHOMONAS VAG: NOT DETECTED

## 2017-06-22 MED ORDER — METRONIDAZOLE 500 MG PO TABS: 500.0000 mg | ORAL_TABLET | Freq: Two times a day (BID) | ORAL | 0 refills | Status: DC

## 2017-06-22 MED ORDER — FLUCONAZOLE 150 MG PO TABS: 150.0000 mg | ORAL_TABLET | Freq: Once | ORAL | 0 refills | Status: AC

## 2017-06-22 MED FILL — LEVOBUNOLOL 0.5% EYE DROPS: 0.5 | 90 days supply | Qty: 10 | Fill #2

## 2017-06-23 MED FILL — FLUCONAZOLE 150 MG TABLET: 150 | 1 days supply | Qty: 1 | Fill #0

## 2017-06-23 MED FILL — metroNIDAZOLE 500 MG TABS: 500 | 7 days supply | Qty: 14 | Fill #0

## 2017-07-20 ENCOUNTER — Encounter: Payer: Self-pay | Admitting: Primary Care

## 2017-07-22 ENCOUNTER — Encounter: Payer: Self-pay | Admitting: Primary Care

## 2017-08-11 MED FILL — CHLORHEXIDINE 0.12% RINSE: 0.12 | 10 days supply | Qty: 473 | Fill #1

## 2017-09-15 MED FILL — LEVOBUNOLOL 0.5% EYE DROPS: 0.5 | 90 days supply | Qty: 10 | Fill #3

## 2017-09-22 ENCOUNTER — Other Ambulatory Visit (INDEPENDENT_AMBULATORY_CARE_PROVIDER_SITE_OTHER): Payer: 59

## 2017-09-22 DIAGNOSIS — D509 Iron deficiency anemia, unspecified: Secondary | ICD-10-CM

## 2017-09-22 LAB — CBC
HCT: 36.5 % (ref 36.0–46.0)
Hemoglobin: 12.4 g/dL (ref 12.0–15.0)
MCHC: 34 g/dL (ref 30.0–36.0)
MCV: 84.5 fl (ref 78.0–100.0)
Platelets: 204 10*3/uL (ref 150.0–400.0)
RBC: 4.32 Mil/uL (ref 3.87–5.11)
RDW: 17.9 % — ABNORMAL HIGH (ref 11.5–15.5)
WBC: 5.4 10*3/uL (ref 4.0–10.5)

## 2017-09-22 LAB — IBC PANEL
IRON: 87 ug/dL (ref 42–145)
SATURATION RATIOS: 22.9 % (ref 20.0–50.0)
TRANSFERRIN: 271 mg/dL (ref 212.0–360.0)

## 2017-10-01 ENCOUNTER — Encounter: Payer: Self-pay | Admitting: Primary Care

## 2017-10-01 ENCOUNTER — Ambulatory Visit (INDEPENDENT_AMBULATORY_CARE_PROVIDER_SITE_OTHER): Payer: 59 | Admitting: Primary Care

## 2017-10-01 VITALS — BP 122/78 | HR 69 | Temp 98.6°F | Ht 68.0 in | Wt 214.8 lb

## 2017-10-01 DIAGNOSIS — M62838 Other muscle spasm: Secondary | ICD-10-CM

## 2017-10-01 MED ORDER — CYCLOBENZAPRINE HCL 10 MG PO TABS: 10.0000 mg | ORAL_TABLET | Freq: Three times a day (TID) | ORAL | 0 refills | Status: DC | PRN

## 2017-10-01 MED FILL — CYCLOBENZAPRINE 10 MG TAB: 10 | 5 days supply | Qty: 15 | Fill #0

## 2017-10-01 NOTE — Patient Instructions (Signed)
You may take the Flexeril up to three times daily as needed for muscle spasms. Start by taking 1/2 tablet at bedtime.  Try taking Ibuprofen along with the muscle relaxer at bedtime as discussed.  Try resting from cardio kickboxing for a week or two.  Complete the massage tomorrow.  Please update me if your symptoms persist.   It was a pleasure to see you today!

## 2017-10-01 NOTE — Progress Notes (Signed)
Subjective:    Patient ID: Angela [name], female    DOB: Nov 16, 1975, 42 y.o.   MRN: [id number]  HPI  [name] is a 42 year old female who presents today with a chief complaint of muscle pain.  Her pain is located to the right lateral abdomen, RUQ, and right flank. She will feel her pain with movement including rotation, laying on her left side, exercise. She's able to reproduce her pain with palpation and movement. She resumed heavy exercise 3 weeks ago which includes cardio kickboxing. She describes her pain as "tight, achy". Her symptoms began three weeks ago. She denies pain with rest, nausea, vomiting, diarrhea, constipation, urinary symptoms.   She's been taking Ibuprofen twice daily for the last two weeks with improvement but without resolve. She will be getting a massage tomorrow. History of cholecystectomy.    Review of Systems  Constitutional: Negative for fever.  Gastrointestinal: Negative for abdominal pain, constipation, diarrhea, nausea and vomiting.  Genitourinary: Negative for dysuria, flank pain and frequency.  Musculoskeletal: Positive for myalgias.  Skin: Negative for color change.       Past Medical History:  Diagnosis Date  . Allergy Enviromental  . Anemia may/2017  . Glaucoma   . Heart murmur childhood  . Prediabetes      Social History   Socioeconomic History  . Marital status: Married    Spouse name: Not on file  . Number of children: Not on file  . Years of education: Not on file  . Highest education level: Not on file  Occupational History  . Not on file  Social Needs  . Financial resource strain: Not on file  . Food insecurity:    Worry: Not on file    Inability: Not on file  . Transportation needs:    Medical: Not on file    Non-medical: Not on file  Tobacco Use  . Smoking status: Never Smoker  . Smokeless tobacco: Never Used  Substance and Sexual Activity  . Alcohol use: Yes    Alcohol/week: 1.2 oz    Types: 2 Glasses of  wine per week  . Drug use: No  . Sexual activity: Yes    Birth control/protection: None  Lifestyle  . Physical activity:    Days per week: Not on file    Minutes per session: Not on file  . Stress: Not on file  Relationships  . Social connections:    Talks on phone: Not on file    Gets together: Not on file    Attends religious service: Not on file    Active member of club or organization: Not on file    Attends meetings of clubs or organizations: Not on file    Relationship status: Not on file  . Intimate partner violence:    Fear of current or ex partner: Not on file    Emotionally abused: Not on file    Physically abused: Not on file    Forced sexual activity: Not on file  Other Topics Concern  . Not on file  Social History Narrative   Married.   No children.   Works in the [employer].   Enjoys watching movies, walking, reading.     Past Surgical History:  Procedure Laterality Date  . CHOLECYSTECTOMY  2005  . EYE SURGERY    . MENISCUS REPAIR Right    x 2    Family History  Problem Relation Age of Onset  . Colon cancer Mother 80  .  Diabetes Mother   . Hypertension Mother   . Arthritis Mother   . Diabetes Father   . Heart disease Father   . Esophageal cancer Maternal Grandmother   . Cancer Maternal Grandmother   . Diabetes Maternal Grandmother   . Alcohol abuse Maternal Grandfather   . Breast cancer Neg Hx     No Known Allergies  Current Outpatient Medications on File Prior to Visit  Medication Sig Dispense Refill  . ferrous sulfate 325 (65 FE) MG tablet Take 1 tablet (325 mg total) by mouth daily. 90 tablet 0  . fluticasone (FLONASE) 50 MCG/ACT nasal spray Place 1 spray into both nostrils daily.    Marland Kitchen ibuprofen (ADVIL,MOTRIN) 200 MG tablet Take 200 mg by mouth as needed.    Marland Kitchen levobunolol (BETAGAN) 0.5 % ophthalmic solution Place 1 drop into the right eye 2 (two) times daily.    . metroNIDAZOLE (FLAGYL) 500 MG tablet Take 1 tablet (500 mg total)  by mouth 2 (two) times daily. 14 tablet 0  . Psyllium (METAMUCIL FIBER PO)      No current facility-administered medications on file prior to visit.     BP 122/78   Pulse 69   Temp 98.6 F (37 C) (Oral)   Ht 5\' 8"  (1.727 m)   Wt 214 lb 12 oz (97.4 kg)   LMP 09/17/2017   SpO2 98%   BMI 32.65 kg/m    Objective:   Physical Exam  Constitutional: She appears well-nourished.  Neck: Neck supple.  Cardiovascular: Normal rate and regular rhythm.  Respiratory: Breath sounds normal.  GI: Soft. Normal appearance. There is no tenderness. There is no CVA tenderness.    Musculoskeletal:       Back:       Arms: Tenderness with deep palpation to right lateral trunk.  Skin: Skin is warm and dry.           Assessment & Plan:  Muscle Strain/Spasm:  Located to right lateral trunk. Exam today without suspicion for GI or renal involvement. Will treat with Flexeril and NSAID's PRN. She will proceed with her massage tomorrow. Discussed to refrain from kickboxing for the next 1-2 weeks. Consider imaging vs PT if symptoms persist.   Pleas Koch, NP

## 2017-11-09 DIAGNOSIS — H4031X3 Glaucoma secondary to eye trauma, right eye, severe stage: Secondary | ICD-10-CM | POA: Diagnosis not present

## 2017-11-09 MED FILL — LATANOPROST 0.005% EYE DRP: 0.005 | 50 days supply | Qty: 3 | Fill #0

## 2017-12-21 DIAGNOSIS — H4031X3 Glaucoma secondary to eye trauma, right eye, severe stage: Secondary | ICD-10-CM | POA: Diagnosis not present

## 2017-12-21 MED FILL — DORZOLAMIDE-TIMOLOL EYE DRP: 22.3-6.8 | 90 days supply | Qty: 10 | Fill #0

## 2017-12-31 DIAGNOSIS — H5711 Ocular pain, right eye: Secondary | ICD-10-CM | POA: Diagnosis not present

## 2018-01-18 DIAGNOSIS — H4031X3 Glaucoma secondary to eye trauma, right eye, severe stage: Secondary | ICD-10-CM | POA: Diagnosis not present

## 2018-01-31 MED FILL — COMBIGAN EYE DROPS: 0.2-0.5 | 30 days supply | Qty: 5 | Fill #0

## 2018-03-13 IMAGING — US US SOFT TISSUE HEAD/NECK
1 series · 14 of 16 positions shown · non-contrast
Comparison: None.

CLINICAL DATA: Nodule of left forehead.

EXAM:
ULTRASOUND OF HEAD/NECK SOFT TISSUES
TECHNIQUE: Ultrasound examination of the head and neck soft tissues was
performed in the area of clinical concern.

[Series 1: us soft tissue head/neck · 0.07mm/px · 14 of 16 slices shown]
[im 1/16]
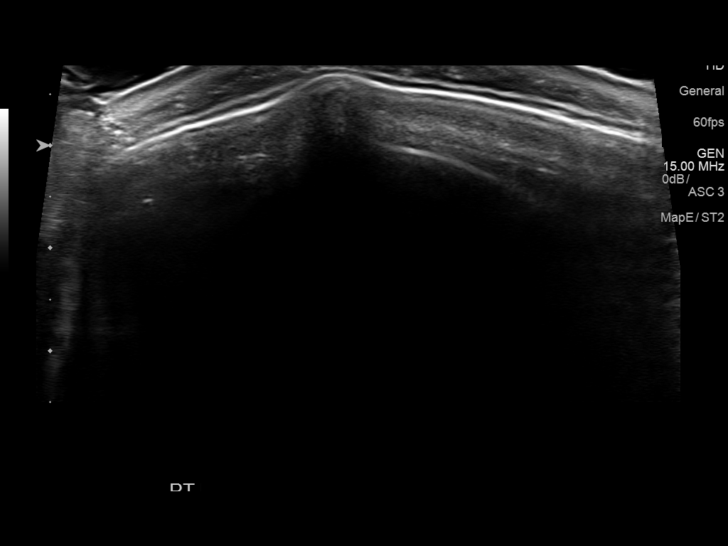
[im 2/16]
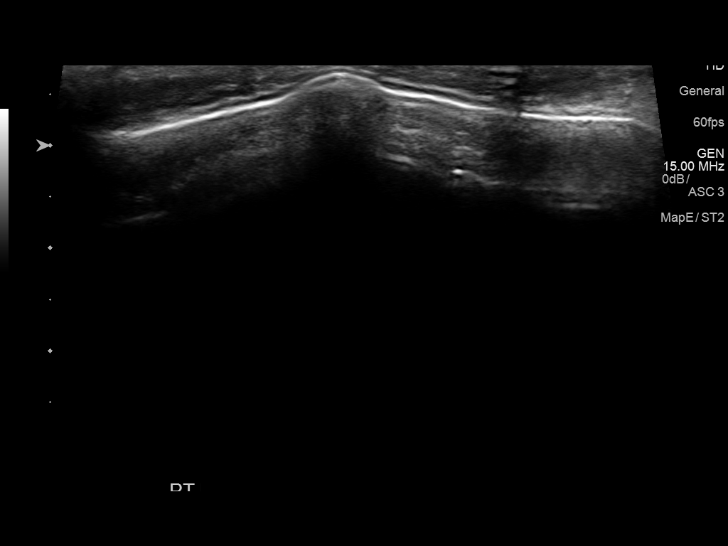
[im 3/16]
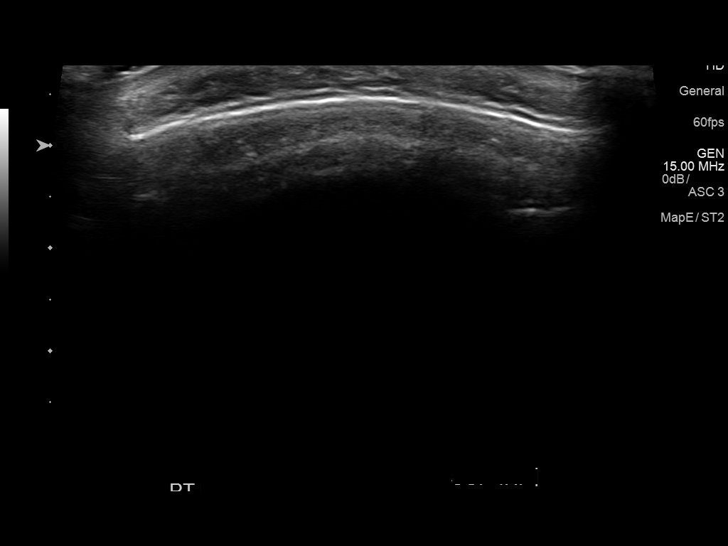
[im 5/16]
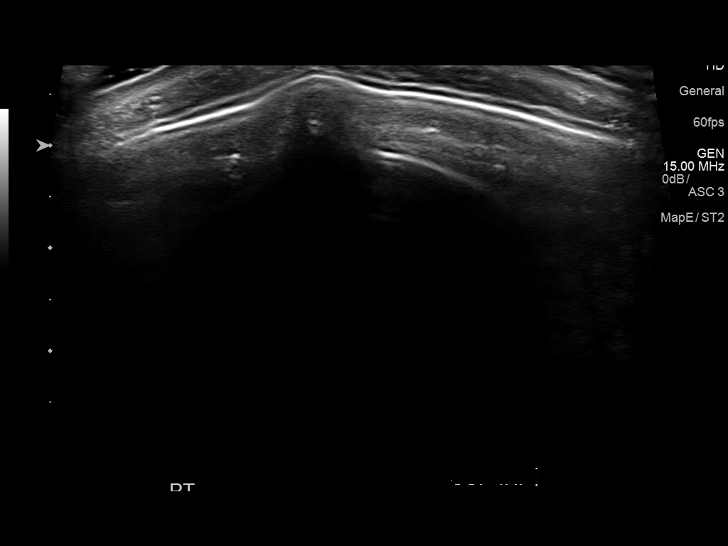
[im 6/16]
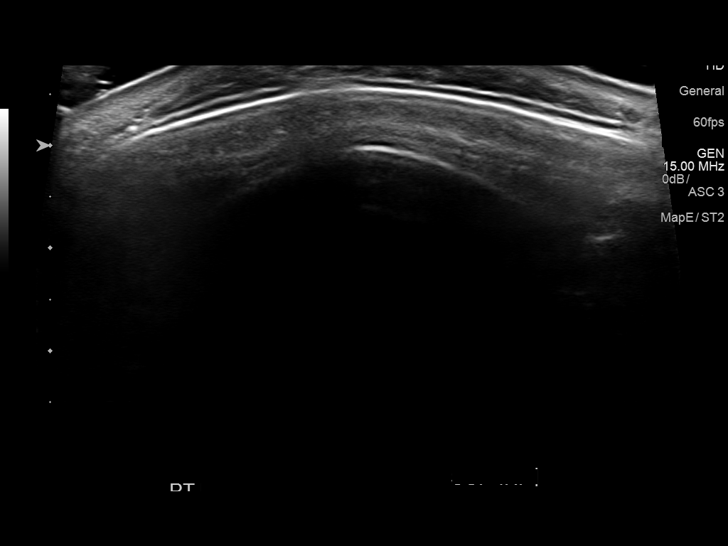
[im 7/16]
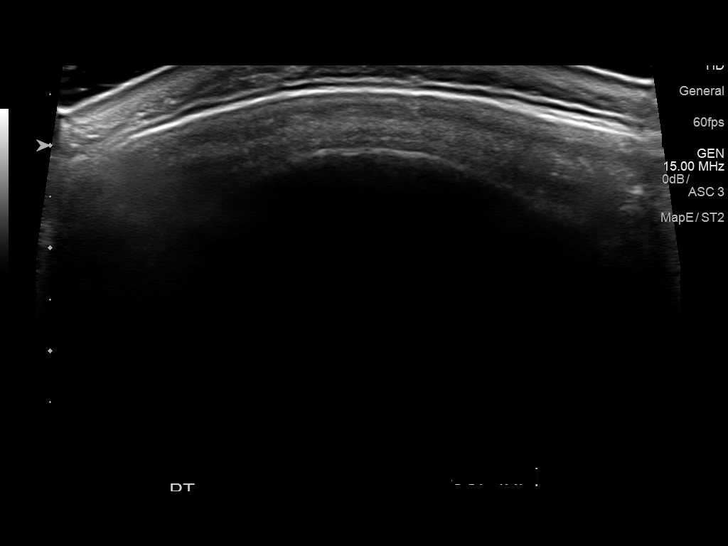
[im 8/16]
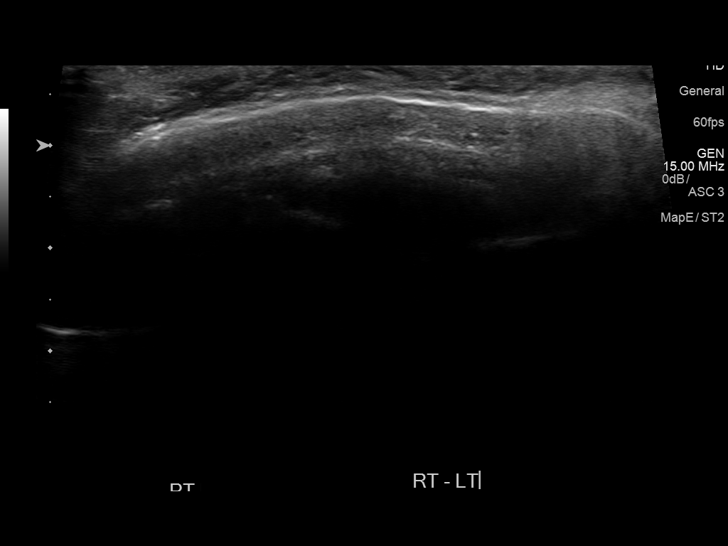
[im 9/16]
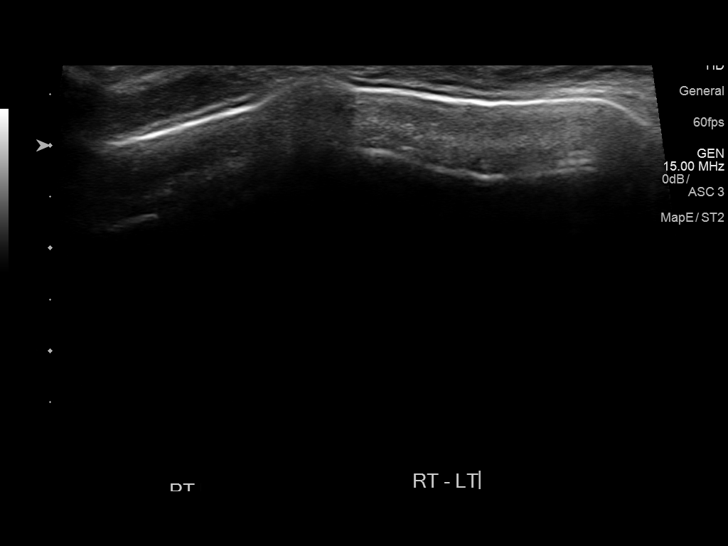
[im 10/16]
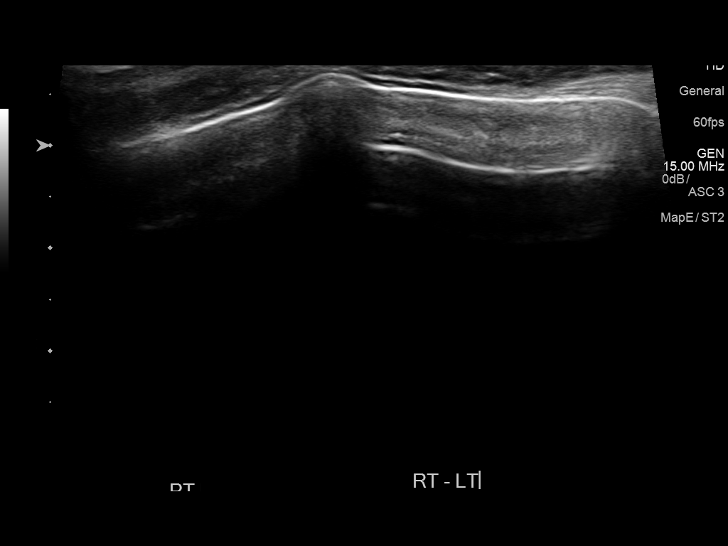
[im 11/16]
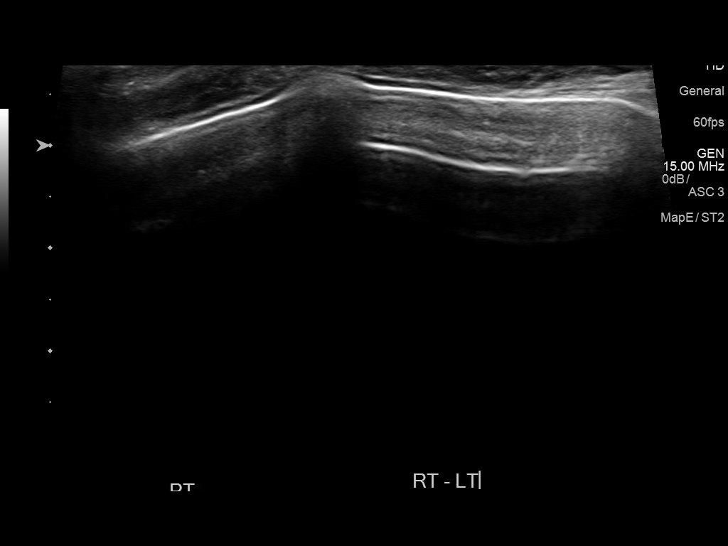
[im 13/16]
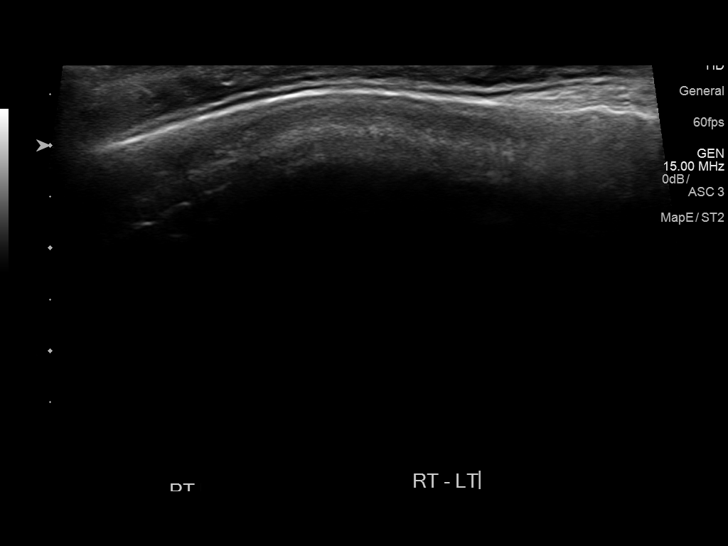
[im 14/16]
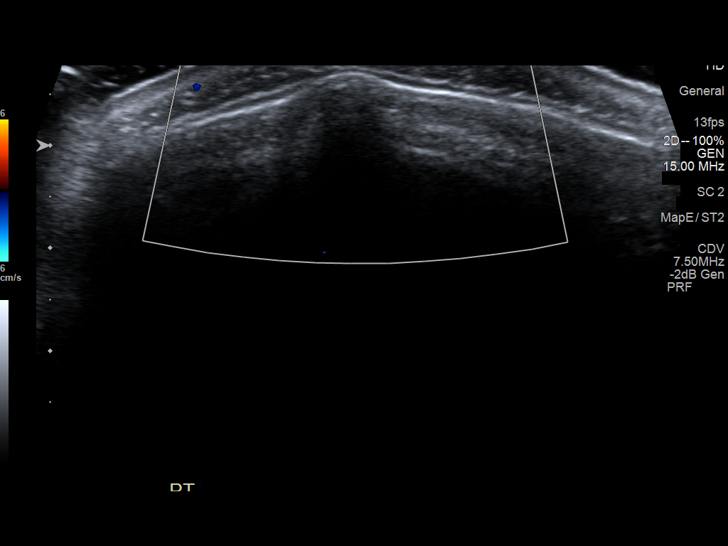
[im 15/16]
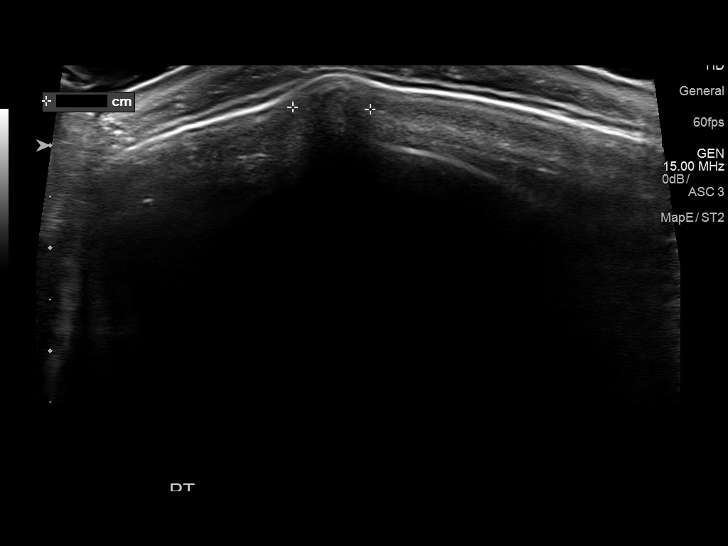
[im 16/16]
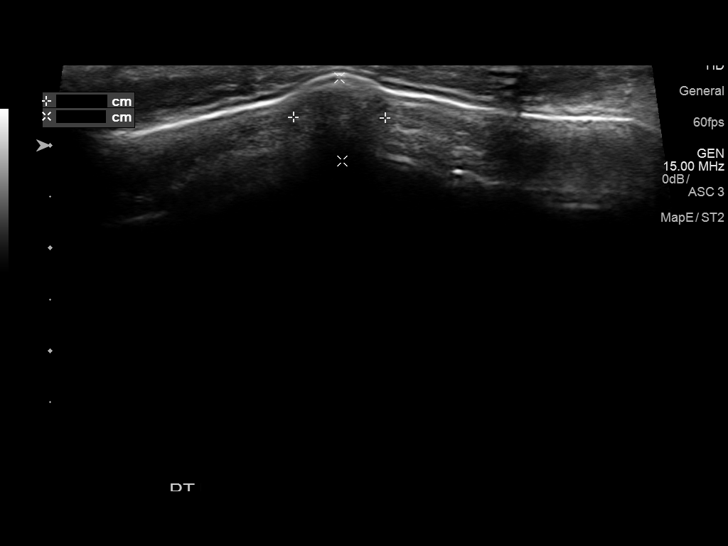

[14 of 16 positions shown; findings below may reference images not displayed]

FINDINGS: Sonographic evaluation in the region of palpable abnormality just to
the left of midline demonstrates an area of heterogeneity within the
frontal bone measuring roughly 9 mm in width. Overlying soft tissues
are unremarkable. Appearance is highly nonspecific by ultrasound
which is not the preferred modality to evaluate bone.
IMPRESSION: Area of heterogeneity in the left frontal bone at the level of
palpable abnormality measuring approximately 9 mm in diameter.
Appearance is highly nonspecific by ultrasound. If there remains a
persistent abnormality in this region clinically, consider further
evaluation with initially CT of the head.

## 2018-04-04 ENCOUNTER — Other Ambulatory Visit: Payer: Self-pay | Admitting: Primary Care

## 2018-04-04 DIAGNOSIS — M62838 Other muscle spasm: Secondary | ICD-10-CM

## 2018-04-05 ENCOUNTER — Other Ambulatory Visit: Payer: Self-pay

## 2018-04-05 DIAGNOSIS — H4031X3 Glaucoma secondary to eye trauma, right eye, severe stage: Secondary | ICD-10-CM | POA: Diagnosis not present

## 2018-04-05 DIAGNOSIS — M62838 Other muscle spasm: Secondary | ICD-10-CM

## 2018-04-05 MED FILL — COMBIGAN EYE DROPS: 0.2-0.5 | 30 days supply | Qty: 5 | Fill #1

## 2018-04-05 MED FILL — LATANOPROST 0.005% EYE DRP: 0.005 | 50 days supply | Qty: 3 | Fill #1

## 2018-04-05 NOTE — Telephone Encounter (Signed)
Last prescribed and seen on 10/01/2017

## 2018-04-05 NOTE — Telephone Encounter (Signed)
Last office visit 10/01/2017.  Last refilled 10/01/2017 for #15 with no refills.  See MyChart message. Next Appt: 06/22/2018 for CPE.  Ok to refill?

## 2018-04-05 NOTE — Telephone Encounter (Signed)
Refill not appropriate.  This was prescribed for an acute visit in June 2019.

## 2018-04-06 MED ORDER — CYCLOBENZAPRINE HCL 10 MG PO TABS: 10.0000 mg | ORAL_TABLET | Freq: Three times a day (TID) | ORAL | 0 refills | Status: DC | PRN

## 2018-04-06 MED FILL — CYCLOBENZAPRINE HCL 10 MG T: 10 | 5 days supply | Qty: 15 | Fill #0

## 2018-04-06 NOTE — Telephone Encounter (Signed)
Noted, refill sent to pharmacy. 

## 2018-04-29 ENCOUNTER — Encounter: Payer: Self-pay | Admitting: Obstetrics and Gynecology

## 2018-04-29 ENCOUNTER — Other Ambulatory Visit: Payer: Self-pay

## 2018-04-29 ENCOUNTER — Ambulatory Visit (INDEPENDENT_AMBULATORY_CARE_PROVIDER_SITE_OTHER): Payer: 59 | Admitting: Obstetrics and Gynecology

## 2018-04-29 VITALS — BP 116/66 | HR 67 | Ht 69.0 in | Wt 225.0 lb

## 2018-04-29 DIAGNOSIS — N920 Excessive and frequent menstruation with regular cycle: Secondary | ICD-10-CM

## 2018-04-29 NOTE — Progress Notes (Signed)
Obstetrics & Gynecology Office Visit   Chief Complaint  Patient presents with  . Gynecologic Exam    Heavy menses x 4 months   History of Present Illness: 43 y.o. G0P0000 female who presents for recent-onset heavy menses. She has to use an overnight pad and if she doesn't change her pad every three hours, she soaks through the pad.  This has been going on for about four months.  Her menses come monthly, lasting 1-2 days more than before (8 days total).  She is having back pain and lower abdominal pain.  She is passing large clots during days 2-4.  She has had no major medical changes since her last visit.  Her last pap smear was nearly two years ago, which was normal.  She notes an increase in weight.  She notes constipation that is more frequent (she normally has it).  She will feel more bloated on these days.  She denies early satiety.  She denies new headaches or new visual changes.  No galactorrhea.  She has seen no one else about this issue.    Past Medical History:  Diagnosis Date  . Allergy Enviromental  . Anemia may/2017  . Glaucoma   . Heart murmur childhood  . Prediabetes     Past Surgical History:  Procedure Laterality Date  . CHOLECYSTECTOMY  2005  . EYE SURGERY    . MENISCUS REPAIR Right    x 2    Gynecologic History: Patient's last menstrual period was 04/27/2018.  Obstetric History: G0P0000  Family History  Problem Relation Age of Onset  . Colon cancer Mother 24  . Diabetes Mother   . Hypertension Mother   . Arthritis Mother   . Diabetes Father   . Heart disease Father   . Esophageal cancer Maternal Grandmother   . Cancer Maternal Grandmother   . Diabetes Maternal Grandmother   . Alcohol abuse Maternal Grandfather   . Breast cancer Neg Hx     Social History   Socioeconomic History  . Marital status: Married    Spouse name: Not on file  . Number of children: Not on file  . Years of education: Not on file  . Highest education level: Not on file    Occupational History  . Occupation: Nurse, adult  Social Needs  . Financial resource strain: Not on file  . Food insecurity:    Worry: Not on file    Inability: Not on file  . Transportation needs:    Medical: Not on file    Non-medical: Not on file  Tobacco Use  . Smoking status: Never Smoker  . Smokeless tobacco: Never Used  Substance and Sexual Activity  . Alcohol use: Yes    Alcohol/week: 2.0 standard drinks    Types: 2 Glasses of wine per week    Comment: occasional  . Drug use: No  . Sexual activity: Yes    Birth control/protection: None  Lifestyle  . Physical activity:    Days per week: 0 days    Minutes per session: Not on file  . Stress: Not on file  Relationships  . Social connections:    Talks on phone: Not on file    Gets together: Not on file    Attends religious service: Not on file    Active member of club or organization: Not on file    Attends meetings of clubs or organizations: Not on file    Relationship status: Not on file  . Intimate partner violence:  Fear of current or ex partner: Not on file    Emotionally abused: Not on file    Physically abused: Not on file    Forced sexual activity: Not on file  Other Topics Concern  . Not on file  Social History Narrative   Married.   No children.   Works in the [employer].   Enjoys watching movies, walking, reading.    Allergies: No Known Allergies  Prior to Admission medications   Medication Sig Start Date End Date Taking? Authorizing Provider  brimonidine-timolol (COMBIGAN) 0.2-0.5 % ophthalmic solution Place 1 drop into both eyes every 12 (twelve) hours.   Yes [provider]  cyclobenzaprine (FLEXERIL) 10 MG tablet Take 1 tablet (10 mg total) by mouth 3 (three) times daily as needed for muscle spasms. 04/06/18  Yes Pleas Koch, NP  ferrous sulfate 325 (65 FE) MG tablet Take 1 tablet (325 mg total) by mouth daily. 06/21/17 06/21/18 Yes Pleas Koch, NP   fluticasone (FLONASE) 50 MCG/ACT nasal spray Place 1 spray into both nostrils daily.   Yes [provider]  ibuprofen (ADVIL,MOTRIN) 200 MG tablet Take 200 mg by mouth as needed.   Yes [provider]  latanoprost (XALATAN) 0.005 % ophthalmic solution 1 drop at bedtime.   Yes [provider]  levobunolol (BETAGAN) 0.5 % ophthalmic solution Place 1 drop into the right eye 2 (two) times daily.    [provider]  metroNIDAZOLE (FLAGYL) 500 MG tablet Take 1 tablet (500 mg total) by mouth 2 (two) times daily. 06/22/17   Pleas Koch, NP  Psyllium (METAMUCIL FIBER PO)  03/20/17   [provider]   Review of Systems  Constitutional: Negative.   HENT: Negative.   Eyes: Negative.   Respiratory: Negative.   Cardiovascular: Negative.   Gastrointestinal: Negative.   Genitourinary: Negative.   Musculoskeletal: Negative.   Skin: Negative.   Neurological: Negative.   Psychiatric/Behavioral: Negative.      Physical Exam BP 116/66 (BP Location: Left Arm, Patient Position: Sitting, Cuff Size: Large)   Pulse 67   Ht 5\' 9"  (1.753 m)   Wt 225 lb (102.1 kg)   LMP 04/27/2018   BMI 33.23 kg/m  Patient's last menstrual period was 04/27/2018. Physical Exam Constitutional:      General: She is not in acute distress.    Appearance: Normal appearance.  HENT:     Head: Normocephalic and atraumatic.  Eyes:     General: No scleral icterus.    Conjunctiva/sclera: Conjunctivae normal.  Neurological:     Mental Status: She is alert.  Psychiatric:        Mood and Affect: Mood normal.        Behavior: Behavior normal.        Judgment: Judgment normal.    Assessment: 43 y.o. G0P0000 female here for here for  1. Menorrhagia with regular cycle      Plan: Problem List Items Addressed This Visit      Other   Menorrhagia with regular cycle - Primary   Relevant Orders   US PELVIS TRANSVANGINAL NON-OB (TV ONLY)   TSH + free T4   Prolactin   CBC     Discussed  management options for abnormal uterine bleeding including expectant, NSAIDs, tranexamic acid (Lysteda), oral progesterone (Provera, norethindrone, megace), Depo Provera, Levonorgestrel containing IUD, endometrial ablation (Novasure) or hysterectomy as definitive surgical management.  Discussed risks and benefits of each method.   Final management decision will hinge on results of  patient's work up and whether an underlying etiology for the patients bleeding symptoms can be discerned.  We will conduct a basic work up examining using the PALM-COIEN classification system.  Printed patient education handouts were given to the patient to review at home.  Bleeding precautions reviewed.   Will perform pelvic exam with pap smear and possible endometrial biopsy depending on the results from the ultrasound.   15 minutes spent in face to face discussion with > 50% spent in counseling,management, and coordination of care of her menorrhagia with regular cycle.    Prentice Docker, MD 04/29/2018 1:08 PM

## 2018-04-30 LAB — CBC
HEMATOCRIT: 34.4 % (ref 34.0–46.6)
HEMOGLOBIN: 11.4 g/dL (ref 11.1–15.9)
MCH: 28.9 pg (ref 26.6–33.0)
MCHC: 33.1 g/dL (ref 31.5–35.7)
MCV: 87 fL (ref 79–97)
Platelets: 182 10*3/uL (ref 150–450)
RBC: 3.94 x10E6/uL (ref 3.77–5.28)
RDW: 13.2 % (ref 11.7–15.4)
WBC: 5.3 10*3/uL (ref 3.4–10.8)

## 2018-04-30 LAB — TSH+FREE T4
Free T4: 0.96 ng/dL (ref 0.82–1.77)
TSH: 1.66 u[IU]/mL (ref 0.450–4.500)

## 2018-04-30 LAB — PROLACTIN: Prolactin: 16.7 ng/mL (ref 4.8–23.3)

## 2018-05-13 ENCOUNTER — Other Ambulatory Visit (HOSPITAL_COMMUNITY)
Admission: RE | Admit: 2018-05-13 | Discharge: 2018-05-13 | Disposition: A | Discharge status: Home / Self Care | Payer: 59 | Source: Ambulatory Visit | Attending: Obstetrics and Gynecology | Admitting: Obstetrics and Gynecology

## 2018-05-13 ENCOUNTER — Ambulatory Visit (INDEPENDENT_AMBULATORY_CARE_PROVIDER_SITE_OTHER): Payer: 59 | Admitting: Obstetrics and Gynecology

## 2018-05-13 ENCOUNTER — Ambulatory Visit (INDEPENDENT_AMBULATORY_CARE_PROVIDER_SITE_OTHER): Payer: 59

## 2018-05-13 ENCOUNTER — Encounter: Payer: Self-pay | Admitting: Obstetrics and Gynecology

## 2018-05-13 VITALS — BP 126/84 | Ht 69.0 in | Wt 221.0 lb

## 2018-05-13 DIAGNOSIS — N920 Excessive and frequent menstruation with regular cycle: Secondary | ICD-10-CM | POA: Diagnosis not present

## 2018-05-13 DIAGNOSIS — N83292 Other ovarian cyst, left side: Secondary | ICD-10-CM

## 2018-05-13 DIAGNOSIS — N840 Polyp of corpus uteri: Secondary | ICD-10-CM | POA: Diagnosis not present

## 2018-05-13 NOTE — Progress Notes (Signed)
Gynecology Ultrasound Follow Up   Chief Complaint  Patient presents with  . Follow-up  Ultrasound for menorrhagia   History of Present Illness: Patient is a 43 y.o. female who presents today for ultrasound evaluation of the above .  Ultrasound demonstrates the following findings Adnexa:  Simple cyst noted with small septation measuring 3.4 x 2.5 x 2.1 cm Uterus: retroverted with endometrial stripe  20.0 mm Additional: The endometrium is heterogeneous and thickened.   Her bleeding has stopped since her last appointment.   Past Medical History:  Diagnosis Date  . Allergy Enviromental  . Anemia may/2017  . Glaucoma   . Heart murmur childhood  . Prediabetes     Past Surgical History:  Procedure Laterality Date  . CHOLECYSTECTOMY  2005  . EYE SURGERY    . MENISCUS REPAIR Right    x 2    Family History  Problem Relation Age of Onset  . Colon cancer Mother 85  . Diabetes Mother   . Hypertension Mother   . Arthritis Mother   . Diabetes Father   . Heart disease Father   . Esophageal cancer Maternal Grandmother   . Cancer Maternal Grandmother   . Diabetes Maternal Grandmother   . Alcohol abuse Maternal Grandfather   . Breast cancer Neg Hx     Social History   Socioeconomic History  . Marital status: Married    Spouse name: Not on file  . Number of children: Not on file  . Years of education: Not on file  . Highest education level: Not on file  Occupational History  . Occupation: Nurse, adult  Social Needs  . Financial resource strain: Not on file  . Food insecurity:    Worry: Not on file    Inability: Not on file  . Transportation needs:    Medical: Not on file    Non-medical: Not on file  Tobacco Use  . Smoking status: Never Smoker  . Smokeless tobacco: Never Used  Substance and Sexual Activity  . Alcohol use: Yes    Alcohol/week: 2.0 standard drinks    Types: 2 Glasses of wine per week    Comment: occasional  . Drug use: No  . Sexual  activity: Yes    Birth control/protection: None  Lifestyle  . Physical activity:    Days per week: 0 days    Minutes per session: Not on file  . Stress: Not on file  Relationships  . Social connections:    Talks on phone: Not on file    Gets together: Not on file    Attends religious service: Not on file    Active member of club or organization: Not on file    Attends meetings of clubs or organizations: Not on file    Relationship status: Not on file  . Intimate partner violence:    Fear of current or ex partner: Not on file    Emotionally abused: Not on file    Physically abused: Not on file    Forced sexual activity: Not on file  Other Topics Concern  . Not on file  Social History Narrative   Married.   No children.   Works in the [employer].   Enjoys watching movies, walking, reading.     No Known Allergies  Prior to Admission medications   Medication Sig Start Date End Date Taking? Authorizing Provider  brimonidine-timolol (COMBIGAN) 0.2-0.5 % ophthalmic solution Place 1 drop into both eyes every 12 (twelve) hours.  Yes [provider]  cyclobenzaprine (FLEXERIL) 10 MG tablet Take 1 tablet (10 mg total) by mouth 3 (three) times daily as needed for muscle spasms. 04/06/18  Yes Pleas Koch, NP  ferrous sulfate 325 (65 FE) MG tablet Take 1 tablet (325 mg total) by mouth daily. 06/21/17 06/21/18 Yes Pleas Koch, NP  fluticasone (FLONASE) 50 MCG/ACT nasal spray Place 1 spray into both nostrils daily.   Yes [provider]  ibuprofen (ADVIL,MOTRIN) 200 MG tablet Take 200 mg by mouth as needed.   Yes [provider]  latanoprost (XALATAN) 0.005 % ophthalmic solution 1 drop at bedtime.   Yes [provider]  Psyllium (METAMUCIL FIBER PO)  03/20/17  Yes [provider]  levobunolol (BETAGAN) 0.5 % ophthalmic solution Place 1 drop into the right eye 2 (two) times daily.    [provider]    Physical  Exam BP 126/84   Ht 5\' 9"  (1.753 m)   Wt 221 lb (100.2 kg)   LMP 04/27/2018   BMI 32.64 kg/m   Physical Exam Exam conducted with a chaperone present.  Constitutional:      General: She is not in acute distress.    Appearance: Normal appearance.  HENT:     Head: Normocephalic and atraumatic.  Genitourinary:    General: Normal vulva.     Exam position: Supine.     Pubic Area: No rash.      Labia:        Right: No rash, tenderness, lesion or injury.        Left: No rash, tenderness, lesion or injury.      Urethra: No prolapse or urethral lesion.     Vagina: No signs of injury. Erythema and tenderness present. No vaginal discharge, bleeding or lesions.     Cervix: No lesion.     Uterus: Normal.      Adnexa: Right adnexa normal and left adnexa normal.     Comments: Exam limited by body habitus Cells collected for pap smear cytology Neurological:     General: No focal deficit present.     Mental Status: She is alert and oriented to person, place, and time.     Cranial Nerves: No cranial nerve deficit.  Psychiatric:        Mood and Affect: Mood normal.        Behavior: Behavior normal.        Judgment: Judgment normal.     Endometrial Biopsy After discussion with the patient regarding her abnormal uterine bleeding I recommended that she proceed with an endometrial biopsy for further diagnosis. The risks, benefits, alternatives, and indications for an endometrial biopsy were discussed with the patient in detail. She understood the risks including infection, bleeding, cervical laceration and uterine perforation.  Verbal consent was obtained.   PROCEDURE NOTE:  Pipelle endometrial biopsy was performed using aseptic technique with iodine preparation.  The uterus was sounded to a length of 8 cm.  Adequate sampling was obtained with minimal blood loss.  The patient tolerated the procedure well.  Disposition will be pending pathology.   Assessment: 43 y.o. G0P0000  1. Menorrhagia with  regular cycle      Plan: Problem List Items Addressed This Visit      Other   Menorrhagia with regular cycle - Primary   Relevant Orders   Cytology - PAP   Surgical pathology     Pap smear performed with testing for STDs just to be sure.  Also,  an endometrial biopsy was performed due to the thickened endometrium on ultrasound.   Will base follow up on the results of these tests.   15 minutes spent in face to face discussion with > 50% spent in counseling,management, and coordination of care of her menorrhagia in the setting of her ultrasound findings.    Prentice Docker, MD, [facility], [facility] 05/13/2018 4:54 PM

## 2018-05-16 MED FILL — COMBIGAN EYE DROPS: 0.2-0.5 | 30 days supply | Qty: 5 | Fill #2

## 2018-05-16 MED FILL — LATANOPROST 0.005% EYE DRP: 0.005 | 50 days supply | Qty: 3 | Fill #2

## 2018-05-18 LAB — CYTOLOGY - PAP
Chlamydia: NEGATIVE
DIAGNOSIS: NEGATIVE
HPV: NOT DETECTED
Neisseria Gonorrhea: NEGATIVE

## 2018-05-25 ENCOUNTER — Telehealth: Payer: Self-pay | Admitting: Obstetrics and Gynecology

## 2018-05-25 NOTE — Telephone Encounter (Signed)
Would you mind calling this patient to schedule an appointment to discuss a procedure with me?  Thank you!

## 2018-05-25 NOTE — Telephone Encounter (Signed)
Left generic VM 

## 2018-05-31 DIAGNOSIS — H4031X3 Glaucoma secondary to eye trauma, right eye, severe stage: Secondary | ICD-10-CM | POA: Diagnosis not present

## 2018-05-31 MED FILL — DORZOLAMIDE HCL 2 % SOLN: 2 | 66 days supply | Qty: 10 | Fill #0

## 2018-06-01 ENCOUNTER — Ambulatory Visit (INDEPENDENT_AMBULATORY_CARE_PROVIDER_SITE_OTHER): Payer: 59 | Admitting: Obstetrics and Gynecology

## 2018-06-01 ENCOUNTER — Encounter: Payer: Self-pay | Admitting: Obstetrics and Gynecology

## 2018-06-01 ENCOUNTER — Telehealth: Payer: Self-pay | Admitting: Obstetrics and Gynecology

## 2018-06-01 VITALS — BP 129/69 | HR 74 | Ht 69.0 in | Wt 225.0 lb

## 2018-06-01 DIAGNOSIS — N920 Excessive and frequent menstruation with regular cycle: Secondary | ICD-10-CM

## 2018-06-01 DIAGNOSIS — N840 Polyp of corpus uteri: Secondary | ICD-10-CM | POA: Insufficient documentation

## 2018-06-01 NOTE — Progress Notes (Signed)
Obstetrics & Gynecology Office Visit   Chief Complaint  Patient presents with  . Follow-up    Discuss Procedure    History of Present Illness: 43 y.o. G0P0000 female who presents to discuss treatment of menorrhagia.  She has had a workup that included a normal pap smear, prolactin, TSH/FT4, and CBC. She had a pelvic ultrasound that showed a thickened endometrial stripe at 20 mm.  An endometrial biopsy showed proliferative phase endometrium and a benign endometrial polyp.  She presents to discuss treatment options.  Past Medical History:  Diagnosis Date  . Allergy Enviromental  . Anemia may/2017  . Glaucoma   . Heart murmur childhood  . Prediabetes     Past Surgical History:  Procedure Laterality Date  . CHOLECYSTECTOMY  2005  . EYE SURGERY    . MENISCUS REPAIR Right    x 2    Gynecologic History: Patient's last menstrual period was 05/27/2018.  Obstetric History: G0P0000  Family History  Problem Relation Age of Onset  . Colon cancer Mother 22  . Diabetes Mother   . Hypertension Mother   . Arthritis Mother   . Diabetes Father   . Heart disease Father   . Esophageal cancer Maternal Grandmother   . Cancer Maternal Grandmother   . Diabetes Maternal Grandmother   . Alcohol abuse Maternal Grandfather   . Breast cancer Neg Hx     Social History   Socioeconomic History  . Marital status: Married    Spouse name: Not on file  . Number of children: Not on file  . Years of education: Not on file  . Highest education level: Not on file  Occupational History  . Occupation: Nurse, adult  Social Needs  . Financial resource strain: Not on file  . Food insecurity:    Worry: Not on file    Inability: Not on file  . Transportation needs:    Medical: Not on file    Non-medical: Not on file  Tobacco Use  . Smoking status: Never Smoker  . Smokeless tobacco: Never Used  Substance and Sexual Activity  . Alcohol use: Yes    Alcohol/week: 2.0 standard drinks   Types: 2 Glasses of wine per week    Comment: occasional  . Drug use: No  . Sexual activity: Yes    Birth control/protection: None  Lifestyle  . Physical activity:    Days per week: 0 days    Minutes per session: Not on file  . Stress: Not on file  Relationships  . Social connections:    Talks on phone: Not on file    Gets together: Not on file    Attends religious service: Not on file    Active member of club or organization: Not on file    Attends meetings of clubs or organizations: Not on file    Relationship status: Not on file  . Intimate partner violence:    Fear of current or ex partner: Not on file    Emotionally abused: Not on file    Physically abused: Not on file    Forced sexual activity: Not on file  Other Topics Concern  . Not on file  Social History Narrative   Married.   No children.   Works in the [employer].   Enjoys watching movies, walking, reading.     No Known Allergies  Prior to Admission medications   Medication Sig Start Date End Date Taking? Authorizing Provider  brimonidine-timolol (COMBIGAN) 0.2-0.5 % ophthalmic solution Place  1 drop into both eyes every 12 (twelve) hours.   Yes [provider]  ferrous sulfate 325 (65 FE) MG tablet Take 1 tablet (325 mg total) by mouth daily. 06/21/17 06/21/18 Yes Pleas Koch, NP  ibuprofen (ADVIL,MOTRIN) 200 MG tablet Take 200 mg by mouth as needed.   Yes [provider]  latanoprost (XALATAN) 0.005 % ophthalmic solution 1 drop at bedtime.   Yes [provider]  Psyllium (METAMUCIL FIBER PO)  03/20/17  Yes [provider]  cyclobenzaprine (FLEXERIL) 10 MG tablet Take 1 tablet (10 mg total) by mouth 3 (three) times daily as needed for muscle spasms. Patient not taking: Reported on 06/01/2018 04/06/18   Pleas Koch, NP  fluticasone [facility]) 50 MCG/ACT nasal spray Place 1 spray into both nostrils daily.    [provider]  levobunolol (BETAGAN) 0.5 %  ophthalmic solution Place 1 drop into the right eye 2 (two) times daily.    [provider]    Review of Systems  Constitutional: Negative.   HENT: Negative.   Eyes: Negative.   Respiratory: Negative.   Cardiovascular: Negative.   Gastrointestinal: Negative.   Genitourinary: Negative.   Musculoskeletal: Negative.   Skin: Negative.   Neurological: Negative.   Psychiatric/Behavioral: Negative.      Physical Exam BP 129/69 (BP Location: Left Arm, Patient Position: Sitting, Cuff Size: Large)   Pulse 74   Ht 5\' 9"  (1.753 m)   Wt 225 lb (102.1 kg)   LMP 05/27/2018   BMI 33.23 kg/m  Patient's last menstrual period was 05/27/2018. Physical Exam Constitutional:      General: She is not in acute distress.    Appearance: Normal appearance.  HENT:     Head: Normocephalic and atraumatic.  Eyes:     General: No scleral icterus.    Conjunctiva/sclera: Conjunctivae normal.  Neurological:     General: No focal deficit present.     Mental Status: She is alert and oriented to person, place, and time.     Cranial Nerves: No cranial nerve deficit.  Psychiatric:        Mood and Affect: Mood normal.        Behavior: Behavior normal.        Judgment: Judgment normal.    Assessment: 43 y.o. G0P0000 female here for  1. Menorrhagia with regular cycle   2. Endometrial polyp      Plan: Problem List Items Addressed This Visit      Genitourinary   Endometrial polyp     Other   Menorrhagia with regular cycle - Primary     Discussed management options, including; do nothing at this time, treatment with medication, and surgery with hysteroscopy, dilation and curettage, endometrial polypectomy.  I discussed that I did not recommend no treatment and that medication treatment would be unlikely to resolve a polyp.  We discussed the hysteroscopy, dilation and curettage, and endometrial polypectomy is the treatment most likely to resolve her symptoms. We discussed the procedure in detail  and discussed the risks and benefits.  She voiced that she would like to proceed with the surgical treatment. Will arrange.  15 minutes spent in face to face discussion with > 50% spent in counseling,management, and coordination of care of her menorrhagia with regular cycle and endometrial polyp.   Prentice Docker, MD 06/01/2018 11:28 AM

## 2018-06-01 NOTE — Telephone Encounter (Signed)
Patient is aware of H&P at [facility] on 06/13/18 @ 8:10a.m. w/ Dr. Glennon Mac, Pre-admit Testing afterwards, and OR on 06/23/18. Patient is aware she may receive calls from the [facility] and [facility]. Patient confirmed UMR, and secondary [facility]. Patient is aware she should have someone drive her on the day of surgery.

## 2018-06-01 NOTE — Telephone Encounter (Signed)
-----   Message from Will Bonnet, MD sent at 06/01/2018 11:31 AM EST ----- Regarding: Schedule surgery Surgery Booking Request Patient Full Name:  Angela [name]  MRN: [id number]  DOB: July 10, 1975  Surgeon: Prentice Docker, MD  Requested Surgery Date and Time: TBD Primary Diagnosis AND Code: menorrhagia with irregular cycle and endometrial polyp Secondary Diagnosis and Code:  Surgical Procedure: hysteroscopy, dilation and curettage, polypectomy L&D Notification: No Admission Status: same day surgery Length of Surgery: 40 minutes Special Case Needs: MyoSure H&P: TBD (date) Phone Interview???: no Interpreter: Language:  Medical Clearance: no Special Scheduling Instructions: none

## 2018-06-13 ENCOUNTER — Encounter: Payer: Self-pay | Admitting: Obstetrics and Gynecology

## 2018-06-13 ENCOUNTER — Ambulatory Visit (INDEPENDENT_AMBULATORY_CARE_PROVIDER_SITE_OTHER): Payer: 59 | Admitting: Obstetrics and Gynecology

## 2018-06-13 ENCOUNTER — Encounter
Admission: RE | Admit: 2018-06-13 | Discharge: 2018-06-13 | Disposition: A | Discharge status: Home / Self Care | Payer: 59 | Source: Ambulatory Visit | Attending: Obstetrics and Gynecology | Admitting: Obstetrics and Gynecology

## 2018-06-13 ENCOUNTER — Other Ambulatory Visit: Payer: Self-pay

## 2018-06-13 VITALS — BP 128/78 | Ht 69.0 in | Wt 225.0 lb

## 2018-06-13 DIAGNOSIS — N920 Excessive and frequent menstruation with regular cycle: Secondary | ICD-10-CM

## 2018-06-13 DIAGNOSIS — D509 Iron deficiency anemia, unspecified: Secondary | ICD-10-CM

## 2018-06-13 DIAGNOSIS — Z01812 Encounter for preprocedural laboratory examination: Secondary | ICD-10-CM | POA: Diagnosis not present

## 2018-06-13 DIAGNOSIS — N840 Polyp of corpus uteri: Secondary | ICD-10-CM | POA: Diagnosis not present

## 2018-06-13 LAB — BASIC METABOLIC PANEL
Anion gap: 5 (ref 5–15)
BUN: 10 mg/dL (ref 6–20)
CO2: 23 mmol/L (ref 22–32)
Calcium: 8.6 mg/dL — ABNORMAL LOW (ref 8.9–10.3)
Chloride: 109 mmol/L (ref 98–111)
Creatinine, Ser: 0.5 mg/dL (ref 0.44–1.00)
GFR calc Af Amer: >60 mL/min (ref >60)
GFR calc non Af Amer: >60 mL/min (ref >60)
Glucose, Bld: 105 mg/dL — ABNORMAL HIGH (ref 70–99)
Potassium: 4 mmol/L (ref 3.5–5.1)
Sodium: 137 mmol/L (ref 135–145)

## 2018-06-13 NOTE — Patient Instructions (Addendum)
Your procedure is scheduled on: Thursday 06/23/18.  Report to DAY SURGERY DEPARTMENT LOCATED ON 2ND FLOOR MEDICAL MALL ENTRANCE. To find out your arrival time please call [phone] between 1PM - 3PM on Wednesday 06/22/18.   Remember: Instructions that are not followed completely may result in serious medical risk, up to and including death, or upon the discretion of your surgeon and anesthesiologist your surgery may need to be rescheduled.      _X__ 1. Do not eat food after midnight the night before your procedure.                 No gum chewing or hard candies. You may drink clear liquids up to 2 hours                 before you are scheduled to arrive for your surgery- DO NOT drink clear                 liquids within 2 hours of the start of your surgery.                 Clear Liquids include:  water, apple juice without pulp, clear carbohydrate                 drink such as Clearfast or Gatorade, Black Coffee or Tea (Do not add                 milk or creamer to coffee or tea).   Dr. Glennon Mac has ordered an Ensure Pre-Surgery Clear Carbohydrate Drink that he would like for you to finish 3 hours prior to your scheduled surgery time. Please finish the drink 2 hours before your arrival time. It is best refrigerated.   __X__2.  On the morning of surgery brush your teeth with toothpaste and water, you may rinse your mouth with mouthwash if you wish.  Do not swallow any toothpaste or mouthwash.      _X__ 3.  No Alcohol for 24 hours before or after surgery.    __X__4.  Notify your doctor if there is any change in your medical condition      (cold, fever, infections).      Do not wear jewelry, make-up, hairpins, clips or nail polish. Do not wear lotions, powders, or perfumes.  Do not shave 48 hours prior to surgery. Men may shave face and neck. Do not bring valuables to the hospital.     [facility] is not responsible for any belongings or valuables.   Contacts, dentures/partials  or body piercings may not be worn into surgery. Bring a case for your contacts, glasses or hearing aids, a denture cup will be supplied.    Patients discharged the day of surgery will not be allowed to drive home.    Please read over the following fact sheets that you were given:   MRSA Information   __X__ Take these medicines the morning of surgery with A SIP OF WATER:     1. brimonidine-timolol (COMBIGAN) 0.2-0.5 % ophthalmic solution  2. dorzolamide (TRUSOPT) 2 % ophthalmic solution  3. fluticasone (FLONASE) 50 MCG/ACT nasal spray if needed  4. cetirizine (ZYRTEC) 10 MG tablet if needed     __X__ Stop Anti-inflammatories 7 days before surgery such as Advil, Ibuprofen, Motrin, BC or Goodies Powder, Naprosyn, Naproxen, Aleve, Aspirin, Meloxicam, Celebrex.  Your last dose of Ibuprofen will be on Thursday February 27th.  May take Tylenol if needed for pain or discomfort.  __X__ Do not begin taking any new herbal supplements until after your procedure.

## 2018-06-13 NOTE — H&P (View-Only) (Signed)
Preoperative History and Physical   Angela [name] is a 43 y.o. G0P0000 here for surgical management of menorrhagia with regular cycle and endometrial polyp.   No significant preoperative concerns.  History of Present Illness: 43 y.o. G0P0000 female who presents for a pre-operative appointment for for management of menorrhagia with regular cycle and an endometrial polyp. She has to use an overnight pad and if she doesn't change her pad every three hours, she soaks through the pad.  This has been going on for about five months.  Her menses come monthly, lasting 1-2 days more than before (8 days total).  She is having back pain and lower abdominal pain.  She is passing large clots during days 2-4.  She has had no major medical changes since her last visit.  Her last pap smear was nearly two years ago, which was normal.  She notes an increase in weight.  She notes constipation that is more frequent (she normally has it).  She will feel more bloated on these days.  She denies early satiety.  She denies new headaches or new visual changes.  No galactorrhea.  She has seen no one else about this issue.  Pelvic ultrasound that showed a retroverted uterus with an endometrial stripe of 20 mm.  She has had a workup that included a normal pap smear, prolactin, TSH/FT4, and CBC. She had a pelvic ultrasound that showed a thickened endometrial stripe at 20 mm.  An endometrial biopsy showed proliferative phase endometrium and a benign endometrial polyp.    Proposed surgery: Hysteroscopy, dilation and curettage with polypectomy  Past Medical History:  Diagnosis Date  . Allergy Enviromental  . Anemia may/2017  . Glaucoma   . Heart murmur childhood  . Prediabetes    Past Surgical History:  Procedure Laterality Date  . CHOLECYSTECTOMY  2005  . EYE SURGERY    . MENISCUS REPAIR Right    x 2   OB History  Gravida Para Term Preterm AB Living  0 0 0 0 0 0  SAB TAB Ectopic Multiple Live Births  0 0 0  0 0  Patient denies any other pertinent gynecologic issues.   Current Outpatient Medications on File Prior to Visit  Medication Sig Dispense Refill  . brimonidine-timolol (COMBIGAN) 0.2-0.5 % ophthalmic solution Place 1 drop into the right eye every 12 (twelve) hours.     . cetirizine (ZYRTEC) 10 MG tablet Take 10 mg by mouth daily as needed for allergies.    . cyclobenzaprine (FLEXERIL) 10 MG tablet Take 1 tablet (10 mg total) by mouth 3 (three) times daily as needed for muscle spasms. (Patient not taking: Reported on 06/01/2018) 15 tablet 0  . dorzolamide (TRUSOPT) 2 % ophthalmic solution Place 1 drop into the right eye 3 (three) times daily.    . ferrous sulfate 325 (65 FE) MG tablet Take 1 tablet (325 mg total) by mouth daily. (Patient taking differently: Take 325 mg by mouth every other day. ) 90 tablet 0  . fluticasone (FLONASE) 50 MCG/ACT nasal spray Place 1 spray into both nostrils 2 (two) times daily as needed for allergies.     Marland Kitchen ibuprofen (ADVIL,MOTRIN) 200 MG tablet Take 200-400 mg by mouth every 6 (six) hours as needed for moderate pain.     Marland Kitchen latanoprost (XALATAN) 0.005 % ophthalmic solution Place 1 drop into the right eye at bedtime.     . Psyllium (METAMUCIL FIBER PO) Take 2 Scoops by mouth at bedtime as needed (constipation).  No current facility-administered medications on file prior to visit.    No Known Allergies  Social History:   reports that she has never smoked. She has never used smokeless tobacco. She reports current alcohol use of about 2.0 standard drinks of alcohol per week. She reports that she does not use drugs.  Family History  Problem Relation Age of Onset  . Colon cancer Mother 35  . Diabetes Mother   . Hypertension Mother   . Arthritis Mother   . Diabetes Father   . Heart disease Father   . Esophageal cancer Maternal Grandmother   . Cancer Maternal Grandmother   . Diabetes Maternal Grandmother   . Alcohol abuse Maternal Grandfather   . Breast  cancer Neg Hx     Review of Systems: Noncontributory  PHYSICAL EXAM: Blood pressure 128/78, height 5\' 9"  (1.753 m), weight 225 lb (102.1 kg), last menstrual period 05/27/2018. CONSTITUTIONAL: Well-developed, well-nourished female in no acute distress.  HENT:  Normocephalic, atraumatic, External right and left ear normal. Oropharynx is clear and moist EYES: Conjunctivae and EOM are normal. Pupils are equal, round, and reactive to light. No scleral icterus.  NECK: Normal range of motion, supple, no masses SKIN: Skin is warm and dry. No rash noted. Not diaphoretic. No erythema. No pallor. [place]: Alert and oriented to person, place, and time. Normal reflexes, muscle tone coordination. No cranial nerve deficit noted. PSYCHIATRIC: Normal mood and affect. Normal behavior. Normal judgment and thought content. CARDIOVASCULAR: Normal heart rate noted, regular rhythm RESPIRATORY: Effort and breath sounds normal, no problems with respiration noted ABDOMEN: Soft, nontender, nondistended. PELVIC: Deferred MUSCULOSKELETAL: Normal range of motion. No edema and no tenderness. 2+ distal pulses.  Assessment: Patient Active Problem List   Diagnosis Date Noted  . Endometrial polyp 06/01/2018  . Menorrhagia with regular cycle 04/29/2018  . Iron deficiency anemia 06/21/2017    Plan: Patient will undergo surgical management with the above-proposed surgery.   The risks of surgery were discussed in detail with the patient including but not limited to: bleeding which may require transfusion or reoperation; infection which may require antibiotics; injury to surrounding organs which may involve bowel, bladder, ureters ; need for additional procedures including laparoscopy or laparotomy; thromboembolic phenomenon, surgical site problems and other postoperative/anesthesia complications. Likelihood of success in alleviating the patient's condition was discussed. Routine postoperative instructions will be reviewed  with the patient and her family in detail after surgery.  The patient concurred with the proposed plan, giving informed written consent for the surgery.  Preoperative prophylactic antibiotics, as indicated, and SCDs ordered on call to the OR.    Prentice Docker, MD 06/13/2018 8:16 AM

## 2018-06-13 NOTE — Progress Notes (Signed)
Preoperative History and Physical   Angela [name] is a 43 y.o. G0P0000 here for surgical management of menorrhagia with regular cycle and endometrial polyp.   No significant preoperative concerns.  History of Present Illness: 43 y.o. G0P0000 female who presents for a pre-operative appointment for for management of menorrhagia with regular cycle and an endometrial polyp. She has to use an overnight pad and if she doesn't change her pad every three hours, she soaks through the pad.  This has been going on for about five months.  Her menses come monthly, lasting 1-2 days more than before (8 days total).  She is having back pain and lower abdominal pain.  She is passing large clots during days 2-4.  She has had no major medical changes since her last visit.  Her last pap smear was nearly two years ago, which was normal.  She notes an increase in weight.  She notes constipation that is more frequent (she normally has it).  She will feel more bloated on these days.  She denies early satiety.  She denies new headaches or new visual changes.  No galactorrhea.  She has seen no one else about this issue.  Pelvic ultrasound that showed a retroverted uterus with an endometrial stripe of 20 mm.  She has had a workup that included a normal pap smear, prolactin, TSH/FT4, and CBC. She had a pelvic ultrasound that showed a thickened endometrial stripe at 20 mm.  An endometrial biopsy showed proliferative phase endometrium and a benign endometrial polyp.    Proposed surgery: Hysteroscopy, dilation and curettage with polypectomy  Past Medical History:  Diagnosis Date  . Allergy Enviromental  . Anemia may/2017  . Glaucoma   . Heart murmur childhood  . Prediabetes    Past Surgical History:  Procedure Laterality Date  . CHOLECYSTECTOMY  2005  . EYE SURGERY    . MENISCUS REPAIR Right    x 2   OB History  Gravida Para Term Preterm AB Living  0 0 0 0 0 0  SAB TAB Ectopic Multiple Live Births  0 0 0  0 0  Patient denies any other pertinent gynecologic issues.   Current Outpatient Medications on File Prior to Visit  Medication Sig Dispense Refill  . brimonidine-timolol (COMBIGAN) 0.2-0.5 % ophthalmic solution Place 1 drop into the right eye every 12 (twelve) hours.     . cetirizine (ZYRTEC) 10 MG tablet Take 10 mg by mouth daily as needed for allergies.    . cyclobenzaprine (FLEXERIL) 10 MG tablet Take 1 tablet (10 mg total) by mouth 3 (three) times daily as needed for muscle spasms. (Patient not taking: Reported on 06/01/2018) 15 tablet 0  . dorzolamide (TRUSOPT) 2 % ophthalmic solution Place 1 drop into the right eye 3 (three) times daily.    . ferrous sulfate 325 (65 FE) MG tablet Take 1 tablet (325 mg total) by mouth daily. (Patient taking differently: Take 325 mg by mouth every other day. ) 90 tablet 0  . fluticasone (FLONASE) 50 MCG/ACT nasal spray Place 1 spray into both nostrils 2 (two) times daily as needed for allergies.     Marland Kitchen ibuprofen (ADVIL,MOTRIN) 200 MG tablet Take 200-400 mg by mouth every 6 (six) hours as needed for moderate pain.     Marland Kitchen latanoprost (XALATAN) 0.005 % ophthalmic solution Place 1 drop into the right eye at bedtime.     . Psyllium (METAMUCIL FIBER PO) Take 2 Scoops by mouth at bedtime as needed (constipation).  No current facility-administered medications on file prior to visit.    No Known Allergies  Social History:   reports that she has never smoked. She has never used smokeless tobacco. She reports current alcohol use of about 2.0 standard drinks of alcohol per week. She reports that she does not use drugs.  Family History  Problem Relation Age of Onset  . Colon cancer Mother 39  . Diabetes Mother   . Hypertension Mother   . Arthritis Mother   . Diabetes Father   . Heart disease Father   . Esophageal cancer Maternal Grandmother   . Cancer Maternal Grandmother   . Diabetes Maternal Grandmother   . Alcohol abuse Maternal Grandfather   . Breast  cancer Neg Hx     Review of Systems: Noncontributory  PHYSICAL EXAM: Blood pressure 128/78, height 5\' 9"  (1.753 m), weight 225 lb (102.1 kg), last menstrual period 05/27/2018. CONSTITUTIONAL: Well-developed, well-nourished female in no acute distress.  HENT:  Normocephalic, atraumatic, External right and left ear normal. Oropharynx is clear and moist EYES: Conjunctivae and EOM are normal. Pupils are equal, round, and reactive to light. No scleral icterus.  NECK: Normal range of motion, supple, no masses SKIN: Skin is warm and dry. No rash noted. Not diaphoretic. No erythema. No pallor. [place]: Alert and oriented to person, place, and time. Normal reflexes, muscle tone coordination. No cranial nerve deficit noted. PSYCHIATRIC: Normal mood and affect. Normal behavior. Normal judgment and thought content. CARDIOVASCULAR: Normal heart rate noted, regular rhythm RESPIRATORY: Effort and breath sounds normal, no problems with respiration noted ABDOMEN: Soft, nontender, nondistended. PELVIC: Deferred MUSCULOSKELETAL: Normal range of motion. No edema and no tenderness. 2+ distal pulses.  Assessment: Patient Active Problem List   Diagnosis Date Noted  . Endometrial polyp 06/01/2018  . Menorrhagia with regular cycle 04/29/2018  . Iron deficiency anemia 06/21/2017    Plan: Patient will undergo surgical management with the above-proposed surgery.   The risks of surgery were discussed in detail with the patient including but not limited to: bleeding which may require transfusion or reoperation; infection which may require antibiotics; injury to surrounding organs which may involve bowel, bladder, ureters ; need for additional procedures including laparoscopy or laparotomy; thromboembolic phenomenon, surgical site problems and other postoperative/anesthesia complications. Likelihood of success in alleviating the patient's condition was discussed. Routine postoperative instructions will be reviewed  with the patient and her family in detail after surgery.  The patient concurred with the proposed plan, giving informed written consent for the surgery.  Preoperative prophylactic antibiotics, as indicated, and SCDs ordered on call to the OR.    Prentice Docker, MD 06/13/2018 8:16 AM

## 2018-06-22 ENCOUNTER — Ambulatory Visit (INDEPENDENT_AMBULATORY_CARE_PROVIDER_SITE_OTHER): Payer: 59 | Admitting: Primary Care

## 2018-06-22 ENCOUNTER — Encounter: Payer: Self-pay | Admitting: Primary Care

## 2018-06-22 VITALS — BP 126/82 | HR 78 | Temp 98.2°F | Ht 69.0 in | Wt 223.5 lb

## 2018-06-22 DIAGNOSIS — K581 Irritable bowel syndrome with constipation: Secondary | ICD-10-CM | POA: Diagnosis not present

## 2018-06-22 DIAGNOSIS — H409 Unspecified glaucoma: Secondary | ICD-10-CM

## 2018-06-22 DIAGNOSIS — N920 Excessive and frequent menstruation with regular cycle: Secondary | ICD-10-CM | POA: Diagnosis not present

## 2018-06-22 DIAGNOSIS — Z1211 Encounter for screening for malignant neoplasm of colon: Secondary | ICD-10-CM

## 2018-06-22 DIAGNOSIS — Z1239 Encounter for other screening for malignant neoplasm of breast: Secondary | ICD-10-CM | POA: Diagnosis not present

## 2018-06-22 DIAGNOSIS — D509 Iron deficiency anemia, unspecified: Secondary | ICD-10-CM | POA: Diagnosis not present

## 2018-06-22 DIAGNOSIS — Z0000 Encounter for general adult medical examination without abnormal findings: Secondary | ICD-10-CM | POA: Diagnosis not present

## 2018-06-22 DIAGNOSIS — D649 Anemia, unspecified: Secondary | ICD-10-CM | POA: Insufficient documentation

## 2018-06-22 DIAGNOSIS — K589 Irritable bowel syndrome without diarrhea: Secondary | ICD-10-CM | POA: Insufficient documentation

## 2018-06-22 DIAGNOSIS — R7303 Prediabetes: Secondary | ICD-10-CM

## 2018-06-22 LAB — HEPATIC FUNCTION PANEL
ALT: 41 U/L — AB (ref 0–35)
AST: 35 U/L (ref 0–37)
Albumin: 4 g/dL (ref 3.5–5.2)
Alkaline Phosphatase: 84 U/L (ref 39–117)
BILIRUBIN TOTAL: 0.4 mg/dL (ref 0.2–1.2)
Bilirubin, Direct: 0.1 mg/dL (ref 0.0–0.3)
TOTAL PROTEIN: 7.2 g/dL (ref 6.0–8.3)

## 2018-06-22 LAB — LIPID PANEL
CHOLESTEROL: 139 mg/dL (ref 0–200)
HDL: 53.9 mg/dL (ref >39.00)
LDL Cholesterol: 64 mg/dL (ref 0–99)
NonHDL: 84.96
TRIGLYCERIDES: 103 mg/dL (ref 0.0–149.0)
Total CHOL/HDL Ratio: 3
VLDL: 20.6 mg/dL (ref 0.0–40.0)

## 2018-06-22 LAB — HEMOGLOBIN A1C: HEMOGLOBIN A1C: 5.9 % (ref 4.6–6.5)

## 2018-06-22 NOTE — Assessment & Plan Note (Addendum)
Secondary to endometrial polyp, scheduled for [facility] tomorrow.  Recent CBC stable.   Using oral iron during menstrual cycles.

## 2018-06-22 NOTE — Progress Notes (Signed)
Subjective:    Patient ID: Angela [name], female    DOB: 1975-09-02, 43 y.o.   MRN: [id number]  HPI  Angela [name] is a 43 year old female who presents today for complete physical.  Immunizations: -Tetanus: Unsure, believes it was in 2016 -Influenza: Completed this season  Diet: She endorses a healthy diet, following with nutritionist.  Breakfast: Egg whites, toast with peanut butter Lunch: Take out food, salad, potatoes/rice, meat Dinner: Meat, vegetables, starch Snacks: Fruit, nuts Desserts: Daily  Beverages: Coffee, water, flavored water, occasional soda and sweet tea, occasional wine  Exercise: She is not currently exercising, was previously doing kickboxing once weekly, walking during lunch breaks Eye exam: Completed in 2020 for glaucoma follow up Dental exam: Completes semi-annually  Pap Smear: Completed in 2020, negative Mammogram:   BP Readings from Last 3 Encounters:  06/22/18 126/82  06/13/18 118/68  06/13/18 128/78   Wt Readings from Last 3 Encounters:  06/22/18 223 lb 8 oz (101.4 kg)  06/13/18 224 lb 11.2 oz (101.9 kg)  06/13/18 225 lb (102.1 kg)     Review of Systems  Constitutional: Negative for unexpected weight change.  HENT: Negative for rhinorrhea.   Respiratory: Negative for cough and shortness of breath.   Cardiovascular: Negative for chest pain.  Gastrointestinal: Positive for abdominal pain and constipation. Negative for diarrhea, nausea and vomiting.       Intermittent constipation. Mild RLQ abdominal pain discomfort.   Genitourinary: Negative for difficulty urinating.  Musculoskeletal: Negative for arthralgias and myalgias.  Skin: Negative for rash.  Allergic/Immunologic: Negative for environmental allergies.  Neurological: Negative for dizziness, numbness and headaches.  Psychiatric/Behavioral: The patient is not nervous/anxious.        Past Medical History:  Diagnosis Date  . Allergy Enviromental  . Anemia may/2017    . Glaucoma   . Heart murmur childhood  . Prediabetes      Social History   Socioeconomic History  . Marital status: Married    Spouse name: Not on file  . Number of children: Not on file  . Years of education: Not on file  . Highest education level: Not on file  Occupational History  . Occupation: Nurse, adult  Social Needs  . Financial resource strain: Not on file  . Food insecurity:    Worry: Not on file    Inability: Not on file  . Transportation needs:    Medical: Not on file    Non-medical: Not on file  Tobacco Use  . Smoking status: Never Smoker  . Smokeless tobacco: Never Used  Substance and Sexual Activity  . Alcohol use: Yes    Alcohol/week: 2.0 standard drinks    Types: 2 Glasses of wine per week    Comment: occasional  . Drug use: No  . Sexual activity: Yes    Birth control/protection: None  Lifestyle  . Physical activity:    Days per week: 0 days    Minutes per session: Not on file  . Stress: Not on file  Relationships  . Social connections:    Talks on phone: Not on file    Gets together: Not on file    Attends religious service: Not on file    Active member of club or organization: Not on file    Attends meetings of clubs or organizations: Not on file    Relationship status: Not on file  . Intimate partner violence:    Fear of current or ex partner: Not on file  Emotionally abused: Not on file    Physically abused: Not on file    Forced sexual activity: Not on file  Other Topics Concern  . Not on file  Social History Narrative   Married.   No children.   Works in the [employer].   Enjoys watching movies, walking, reading.     Past Surgical History:  Procedure Laterality Date  . CHOLECYSTECTOMY  2005  . ERCP     3 times  . EYE SURGERY    . MENISCUS REPAIR Right    x 2    Family History  Problem Relation Age of Onset  . Colon cancer Mother 54  . Diabetes Mother   . Hypertension Mother   . Arthritis Mother   .  Diabetes Father   . Heart disease Father   . Esophageal cancer Maternal Grandmother   . Cancer Maternal Grandmother   . Diabetes Maternal Grandmother   . Alcohol abuse Maternal Grandfather   . Breast cancer Neg Hx     No Known Allergies  Current Outpatient Medications on File Prior to Visit  Medication Sig Dispense Refill  . brimonidine-timolol (COMBIGAN) 0.2-0.5 % ophthalmic solution Place 1 drop into the right eye every 12 (twelve) hours.     . cetirizine (ZYRTEC) 10 MG tablet Take 10 mg by mouth daily as needed for allergies.    Marland Kitchen dorzolamide (TRUSOPT) 2 % ophthalmic solution Place 1 drop into the right eye 3 (three) times daily.    . ferrous sulfate 325 (65 FE) MG tablet Take 1 tablet (325 mg total) by mouth daily. (Patient taking differently: Take 325 mg by mouth every other day. ) 90 tablet 0  . fluticasone (FLONASE) 50 MCG/ACT nasal spray Place 1 spray into both nostrils 2 (two) times daily as needed for allergies.     Marland Kitchen ibuprofen (ADVIL,MOTRIN) 200 MG tablet Take 200-400 mg by mouth every 6 (six) hours as needed for moderate pain.     Marland Kitchen latanoprost (XALATAN) 0.005 % ophthalmic solution Place 1 drop into the right eye at bedtime.     . Psyllium (METAMUCIL FIBER PO) Take 2 Scoops by mouth at bedtime as needed (constipation).     . cyclobenzaprine (FLEXERIL) 10 MG tablet Take 1 tablet (10 mg total) by mouth 3 (three) times daily as needed for muscle spasms. (Patient not taking: Reported on 06/01/2018) 15 tablet 0   No current facility-administered medications on file prior to visit.     BP 126/82   Pulse 78   Temp 98.2 F (36.8 C) (Oral)   Ht 5\' 9"  (1.753 m)   Wt 223 lb 8 oz (101.4 kg)   LMP 05/27/2018   SpO2 98%   BMI 33.01 kg/m    Objective:   Physical Exam  Constitutional: She is oriented to person, place, and time. She appears well-nourished.  HENT:  Mouth/Throat: No oropharyngeal exudate.  Eyes: Pupils are equal, round, and reactive to light. EOM are normal.    Neck: Neck supple. No thyromegaly present.  Cardiovascular: Normal rate and regular rhythm.  Respiratory: Effort normal and breath sounds normal.  GI: Soft. Bowel sounds are normal.  Discomfort to RLQ, no guarding, rebound tenderness.   Musculoskeletal: Normal range of motion.  Neurological: She is alert and oriented to person, place, and time.  Skin: Skin is warm and dry.  Psychiatric: She has a normal mood and affect.           Assessment & Plan:

## 2018-06-22 NOTE — Assessment & Plan Note (Signed)
Chronic for years, using metamucil with good relief in general. Recent RLQ and LLQ pain intermittently x 1 month, also scheduled for [facility] tomorrow due to menorrhagia. Will have her update once D&C is complete. No history of known diverticulosis. No alarm signs.

## 2018-06-22 NOTE — Assessment & Plan Note (Addendum)
Immunizations UTD. Pap smear UTD. Colon cancer screening referral placed given family history. Mammogram due, orders placed. Exam as noted, history of chronic IBS. Labs pending. Follow up in 1 year for CPE.

## 2018-06-22 NOTE — Assessment & Plan Note (Signed)
Will undergo D&C tomorrow for endometrial polyp which is presumed to be causing symptoms. Continue oral iron as needed.

## 2018-06-22 NOTE — Patient Instructions (Signed)
Stop by the lab prior to leaving today. I will notify you of your results once received.   Start exercising. You should be getting 150 minutes of moderate intensity exercise weekly.  It's important to improve your diet by reducing consumption of fast food, fried food, processed snack foods, sugary drinks. Increase consumption of fresh vegetables and fruits, whole grains, water.  Ensure you are drinking 64 ounces of water daily.  You will be contacted regarding your referral to GI for the colonoscopy.  Please let us know if you have not been contacted within one week.   Call the [facility] for your mammogram.   It was a pleasure to see you today!   Preventive Care 40-64 Years, Female Preventive care refers to lifestyle choices and visits with your health care provider that can promote health and wellness. What does preventive care include?   A yearly physical exam. This is also called an annual well check.  Dental exams once or twice a year.  Routine eye exams. Ask your health care provider how often you should have your eyes checked.  Personal lifestyle choices, including: ? Daily care of your teeth and gums. ? Regular physical activity. ? Eating a healthy diet. ? Avoiding tobacco and drug use. ? Limiting alcohol use. ? Practicing safe sex. ? Taking low-dose aspirin daily starting at age 16. ? Taking vitamin and mineral supplements as recommended by your health care provider. What happens during an annual well check? The services and screenings done by your health care provider during your annual well check will depend on your age, overall health, lifestyle risk factors, and family history of disease. Counseling Your health care provider may ask you questions about your:  Alcohol use.  Tobacco use.  Drug use.  Emotional well-being.  Home and relationship well-being.  Sexual activity.  Eating habits.  Work and work Statistician.  Method of birth  control.  Menstrual cycle.  Pregnancy history. Screening You may have the following tests or measurements:  Height, weight, and BMI.  Blood pressure.  Lipid and cholesterol levels. These may be checked every 5 years, or more frequently if you are over 41 years old.  Skin check.  Lung cancer screening. You may have this screening every year starting at age 49 if you have a 30-pack-year history of smoking and currently smoke or have quit within the past 15 years.  Colorectal cancer screening. All adults should have this screening starting at age 75 and continuing until age 44. Your health care provider may recommend screening at age 11. You will have tests every 1-10 years, depending on your results and the type of screening test. People at increased risk should start screening at an earlier age. Screening tests may include: ? Guaiac-based fecal occult blood testing. ? Fecal immunochemical test (FIT). ? Stool DNA test. ? Virtual colonoscopy. ? Sigmoidoscopy. During this test, a flexible tube with a tiny camera (sigmoidoscope) is used to examine your rectum and lower colon. The sigmoidoscope is inserted through your anus into your rectum and lower colon. ? Colonoscopy. During this test, a long, thin, flexible tube with a tiny camera (colonoscope) is used to examine your entire colon and rectum.  Hepatitis C blood test.  Hepatitis B blood test.  Sexually transmitted disease (STD) testing.  Diabetes screening. This is done by checking your blood sugar (glucose) after you have not eaten for a while (fasting). You may have this done every 1-3 years.  Mammogram. This may be done  every 1-2 years. Talk to your health care provider about when you should start having regular mammograms. This may depend on whether you have a family history of breast cancer.  BRCA-related cancer screening. This may be done if you have a family history of breast, ovarian, tubal, or peritoneal cancers.  Pelvic  exam and Pap test. This may be done every 3 years starting at age 10. Starting at age 33, this may be done every 5 years if you have a Pap test in combination with an HPV test.  Bone density scan. This is done to screen for osteoporosis. You may have this scan if you are at high risk for osteoporosis. Discuss your test results, treatment options, and if necessary, the need for more tests with your health care provider. Vaccines Your health care provider may recommend certain vaccines, such as:  Influenza vaccine. This is recommended every year.  Tetanus, diphtheria, and acellular pertussis (Tdap, Td) vaccine. You may need a Td booster every 10 years.  Varicella vaccine. You may need this if you have not been vaccinated.  Zoster vaccine. You may need this after age 22.  Measles, mumps, and rubella (MMR) vaccine. You may need at least one dose of MMR if you were born in 1957 or later. You may also need a second dose.  Pneumococcal 13-valent conjugate (PCV13) vaccine. You may need this if you have certain conditions and were not previously vaccinated.  Pneumococcal polysaccharide (PPSV23) vaccine. You may need one or two doses if you smoke cigarettes or if you have certain conditions.  Meningococcal vaccine. You may need this if you have certain conditions.  Hepatitis A vaccine. You may need this if you have certain conditions or if you travel or work in places where you may be exposed to hepatitis A.  Hepatitis B vaccine. You may need this if you have certain conditions or if you travel or work in places where you may be exposed to hepatitis B.  Haemophilus influenzae type b (Hib) vaccine. You may need this if you have certain conditions. Talk to your health care provider about which screenings and vaccines you need and how often you need them. This information is not intended to replace advice given to you by your health care provider. Make sure you discuss any questions you have with  your health care provider. Document Released: 05/03/2015 Document Revised: 05/27/2017 Document Reviewed: 02/05/2015 Elsevier Interactive Patient Education  2019 [employer].

## 2018-06-22 NOTE — Assessment & Plan Note (Signed)
Following with ophthalmology regularly.

## 2018-06-23 ENCOUNTER — Ambulatory Visit: Payer: 59 | Admitting: Certified Registered Nurse Anesthetist

## 2018-06-23 ENCOUNTER — Encounter: Payer: Self-pay | Admitting: *Deleted

## 2018-06-23 ENCOUNTER — Other Ambulatory Visit: Payer: Self-pay

## 2018-06-23 ENCOUNTER — Ambulatory Visit
Admission: RE | Admit: 2018-06-23 | Discharge: 2018-06-23 | Disposition: A | Discharge status: Home / Self Care | Payer: 59 | Source: Ambulatory Visit | Attending: Obstetrics and Gynecology | Admitting: Obstetrics and Gynecology

## 2018-06-23 ENCOUNTER — Encounter
Admission: RE | Disposition: A | Discharge status: Home / Self Care | Payer: Self-pay | Source: Ambulatory Visit | Attending: Obstetrics and Gynecology

## 2018-06-23 DIAGNOSIS — N840 Polyp of corpus uteri: Secondary | ICD-10-CM

## 2018-06-23 DIAGNOSIS — Z79899 Other long term (current) drug therapy: Secondary | ICD-10-CM | POA: Diagnosis not present

## 2018-06-23 DIAGNOSIS — R7303 Prediabetes: Secondary | ICD-10-CM | POA: Diagnosis not present

## 2018-06-23 DIAGNOSIS — K5900 Constipation, unspecified: Secondary | ICD-10-CM | POA: Diagnosis not present

## 2018-06-23 DIAGNOSIS — Z791 Long term (current) use of non-steroidal anti-inflammatories (NSAID): Secondary | ICD-10-CM | POA: Diagnosis not present

## 2018-06-23 DIAGNOSIS — D509 Iron deficiency anemia, unspecified: Secondary | ICD-10-CM

## 2018-06-23 DIAGNOSIS — N920 Excessive and frequent menstruation with regular cycle: Secondary | ICD-10-CM | POA: Diagnosis not present

## 2018-06-23 DIAGNOSIS — Z9049 Acquired absence of other specified parts of digestive tract: Secondary | ICD-10-CM | POA: Diagnosis not present

## 2018-06-23 DIAGNOSIS — R011 Cardiac murmur, unspecified: Secondary | ICD-10-CM | POA: Diagnosis not present

## 2018-06-23 DIAGNOSIS — N939 Abnormal uterine and vaginal bleeding, unspecified: Secondary | ICD-10-CM | POA: Diagnosis not present

## 2018-06-23 DIAGNOSIS — H409 Unspecified glaucoma: Secondary | ICD-10-CM | POA: Insufficient documentation

## 2018-06-23 DIAGNOSIS — N921 Excessive and frequent menstruation with irregular cycle: Secondary | ICD-10-CM | POA: Diagnosis not present

## 2018-06-23 DIAGNOSIS — N711 Chronic inflammatory disease of uterus: Secondary | ICD-10-CM | POA: Insufficient documentation

## 2018-06-23 HISTORY — PX: DILATATION & CURETTAGE/HYSTEROSCOPY WITH MYOSURE: SHX6511

## 2018-06-23 LAB — POCT PREGNANCY, URINE: Preg Test, Ur: NEGATIVE

## 2018-06-23 SURGERY — DILATATION & CURETTAGE/HYSTEROSCOPY WITH MYOSURE
Anesthesia: General

## 2018-06-23 MED ORDER — PROPOFOL 10 MG/ML IV BOLUS
INTRAVENOUS | Status: AC
  Filled 2018-06-23: qty 20

## 2018-06-23 MED ORDER — PROMETHAZINE HCL 25 MG/ML IJ SOLN: 6.2500 mg | INTRAMUSCULAR | Status: DC | PRN

## 2018-06-23 MED ORDER — OXYCODONE HCL 5 MG/5ML PO SOLN: 5.0000 mg | Freq: Once | ORAL | Status: DC | PRN

## 2018-06-23 MED ORDER — MIDAZOLAM HCL 2 MG/2ML IJ SOLN
INTRAMUSCULAR | Status: DC | PRN
  Administered 2018-06-23: 2 mg via INTRAVENOUS

## 2018-06-23 MED ORDER — KETOROLAC TROMETHAMINE 30 MG/ML IJ SOLN
INTRAMUSCULAR | Status: DC | PRN
  Administered 2018-06-23: 30 mg via INTRAVENOUS

## 2018-06-23 MED ORDER — DEXAMETHASONE SODIUM PHOSPHATE 10 MG/ML IJ SOLN
INTRAMUSCULAR | Status: DC | PRN
  Administered 2018-06-23: 8 mg via INTRAVENOUS

## 2018-06-23 MED ORDER — FAMOTIDINE 20 MG PO TABS
20.0000 mg | ORAL_TABLET | Freq: Once | ORAL | Status: AC
  Administered 2018-06-23: 20 mg via ORAL

## 2018-06-23 MED ORDER — FENTANYL CITRATE (PF) 100 MCG/2ML IJ SOLN: 25.0000 ug | INTRAMUSCULAR | Status: DC | PRN

## 2018-06-23 MED ORDER — FAMOTIDINE 20 MG PO TABS
ORAL_TABLET | ORAL | Status: AC
  Filled 2018-06-23: qty 1

## 2018-06-23 MED ORDER — DEXAMETHASONE SODIUM PHOSPHATE 4 MG/ML IJ SOLN
INTRAMUSCULAR | Status: AC
  Filled 2018-06-23: qty 1

## 2018-06-23 MED ORDER — FENTANYL CITRATE (PF) 100 MCG/2ML IJ SOLN
INTRAMUSCULAR | Status: AC
  Filled 2018-06-23: qty 2

## 2018-06-23 MED ORDER — LIDOCAINE HCL (PF) 2 % IJ SOLN
INTRAMUSCULAR | Status: AC
  Filled 2018-06-23: qty 10

## 2018-06-23 MED ORDER — LACTATED RINGERS IV SOLN
INTRAVENOUS | Status: DC
  Administered 2018-06-23: 09:00:00 via INTRAVENOUS

## 2018-06-23 MED ORDER — MIDAZOLAM HCL 2 MG/2ML IJ SOLN
INTRAMUSCULAR | Status: AC
  Filled 2018-06-23: qty 2

## 2018-06-23 MED ORDER — OXYCODONE HCL 5 MG PO TABS: 5.0000 mg | ORAL_TABLET | Freq: Once | ORAL | Status: DC | PRN

## 2018-06-23 MED ORDER — PROPOFOL 10 MG/ML IV BOLUS
INTRAVENOUS | Status: DC | PRN
  Administered 2018-06-23: 200 mg via INTRAVENOUS

## 2018-06-23 MED ORDER — GLYCOPYRROLATE 0.2 MG/ML IJ SOLN
INTRAMUSCULAR | Status: DC | PRN
  Administered 2018-06-23: 0.2 mg via INTRAVENOUS

## 2018-06-23 MED ORDER — LIDOCAINE HCL (CARDIAC) PF 100 MG/5ML IV SOSY
PREFILLED_SYRINGE | INTRAVENOUS | Status: DC | PRN
  Administered 2018-06-23: 100 mg via INTRAVENOUS

## 2018-06-23 MED ORDER — LACTATED RINGERS IV SOLN
INTRAVENOUS | Status: DC | PRN
  Administered 2018-06-23: 09:00:00 via INTRAVENOUS

## 2018-06-23 MED ORDER — ONDANSETRON HCL 4 MG/2ML IJ SOLN
INTRAMUSCULAR | Status: DC | PRN
  Administered 2018-06-23: 4 mg via INTRAVENOUS

## 2018-06-23 MED ORDER — MEPERIDINE HCL 50 MG/ML IJ SOLN: 6.2500 mg | INTRAMUSCULAR | Status: DC | PRN

## 2018-06-23 MED ORDER — SEVOFLURANE IN SOLN
RESPIRATORY_TRACT | Status: AC
  Filled 2018-06-23: qty 250

## 2018-06-23 MED ORDER — SILVER NITRATE-POT NITRATE 75-25 % EX MISC
CUTANEOUS | Status: AC
  Filled 2018-06-23: qty 1

## 2018-06-23 MED ORDER — ONDANSETRON HCL 4 MG/2ML IJ SOLN
INTRAMUSCULAR | Status: AC
  Filled 2018-06-23: qty 2

## 2018-06-23 MED ORDER — FENTANYL CITRATE (PF) 100 MCG/2ML IJ SOLN
INTRAMUSCULAR | Status: DC | PRN
  Administered 2018-06-23: 50 ug via INTRAVENOUS
  Administered 2018-06-23 (×2): 25 ug via INTRAVENOUS

## 2018-06-23 MED ORDER — KETOROLAC TROMETHAMINE 30 MG/ML IJ SOLN
INTRAMUSCULAR | Status: AC
  Filled 2018-06-23: qty 1

## 2018-06-23 MED ORDER — IBUPROFEN 600 MG PO TABS: 600.0000 mg | ORAL_TABLET | Freq: Four times a day (QID) | ORAL | 0 refills | Status: DC | PRN

## 2018-06-23 MED FILL — IBUPROFEN 600 MG TABLET: 600 | 7 days supply | Qty: 30 | Fill #0

## 2018-06-23 SURGICAL SUPPLY — 23 items
BAG URINE DRAINAGE (UROLOGICAL SUPPLIES) IMPLANT
CATH FOLEY 2WAY  5CC 16FR (CATHETERS)
CATH ROBINSON RED A/P 16FR (CATHETERS) ×3 IMPLANT
CATH URTH 16FR FL 2W BLN LF (CATHETERS) IMPLANT
COVER WAND RF STERILE (DRAPES) ×3 IMPLANT
DEVICE MYOSURE LITE (MISCELLANEOUS) ×3 IMPLANT
DEVICE MYOSURE REACH (MISCELLANEOUS) IMPLANT
ELECT REM PT RETURN 9FT ADLT (ELECTROSURGICAL) ×3
ELECTRODE REM PT RTRN 9FT ADLT (ELECTROSURGICAL) ×1 IMPLANT
GLOVE BIO SURGEON STRL SZ7 (GLOVE) ×9 IMPLANT
GLOVE BIOGEL PI IND STRL 7.5 (GLOVE) ×3 IMPLANT
GLOVE BIOGEL PI INDICATOR 7.5 (GLOVE) ×6
GOWN STRL REUS W/ TWL LRG LVL3 (GOWN DISPOSABLE) ×2 IMPLANT
GOWN STRL REUS W/TWL LRG LVL3 (GOWN DISPOSABLE) ×4
KIT PROCEDURE FLUENT (KITS) ×3 IMPLANT
KIT TURNOVER CYSTO (KITS) ×3 IMPLANT
PACK DNC HYST (MISCELLANEOUS) ×3 IMPLANT
PAD OB MATERNITY 4.3X12.25 (PERSONAL CARE ITEMS) ×3 IMPLANT
PAD PREP 24X41 OB/GYN DISP (PERSONAL CARE ITEMS) ×3 IMPLANT
SOL .9 NS 3000ML IRR  AL (IV SOLUTION) ×2
SOL .9 NS 3000ML IRR UROMATIC (IV SOLUTION) ×1 IMPLANT
TUBING CONNECTING 10 (TUBING) ×2 IMPLANT
TUBING CONNECTING 10' (TUBING) ×1

## 2018-06-23 NOTE — Anesthesia Preprocedure Evaluation (Addendum)
Anesthesia Evaluation  Patient identified by MRN, date of birth, ID band Patient awake    Reviewed: Allergy & Precautions, NPO status , Patient's Chart, lab work & pertinent test results  History of Anesthesia Complications Negative for: history of anesthetic complications  Airway Mallampati: III  TM Distance: >3 FB Neck ROM: Full    Dental  (+) Implants   Pulmonary neg pulmonary ROS, neg sleep apnea, neg COPD,    breath sounds clear to auscultation- rhonchi (-) wheezing      Cardiovascular Exercise Tolerance: Good (-) hypertension(-) CAD and (-) Past MI  Rhythm:Regular Rate:Normal - Systolic murmurs and - Diastolic murmurs    Neuro/Psych negative neurological ROS  negative psych ROS   GI/Hepatic negative GI ROS, Neg liver ROS,   Endo/Other  negative endocrine ROSneg diabetes  Renal/GU negative Renal ROS     Musculoskeletal negative musculoskeletal ROS (+)   Abdominal (+) + obese,   Peds  Hematology negative hematology ROS (+) anemia ,   Anesthesia Other Findings Past Medical History: Enviromental: Allergy may/2017: Anemia No date: Glaucoma childhood: Heart murmur No date: Prediabetes   Reproductive/Obstetrics                            Anesthesia Physical Anesthesia Plan  ASA: II  Anesthesia Plan: General   Post-op Pain Management:    Induction: Intravenous  PONV Risk Score and Plan: 2 and Ondansetron, Dexamethasone and Midazolam  Airway Management Planned: LMA  Additional Equipment:   Intra-op Plan:   Post-operative Plan:   Informed Consent: I have reviewed the patients History and Physical, chart, labs and discussed the procedure including the risks, benefits and alternatives for the proposed anesthesia with the patient or authorized representative who has indicated his/her understanding and acceptance.     Dental advisory given  Plan Discussed with: CRNA and  Anesthesiologist  Anesthesia Plan Comments:         Anesthesia Quick Evaluation

## 2018-06-23 NOTE — Anesthesia Post-op Follow-up Note (Signed)
Anesthesia QCDR form completed.        

## 2018-06-23 NOTE — Anesthesia Procedure Notes (Signed)
Procedure Name: LMA Insertion Performed by: [name] Charity, CRNA Pre-anesthesia Checklist: Patient identified, Patient being monitored, Timeout performed, Emergency Drugs available and Suction available Patient Re-evaluated:Patient Re-evaluated prior to induction Oxygen Delivery Method: Circle system utilized Preoxygenation: Pre-oxygenation with 100% oxygen Induction Type: IV induction LMA: LMA inserted LMA Size: 3.5 Tube type: Oral Number of attempts: 1 Placement Confirmation: positive ETCO2 and breath sounds checked- equal and bilateral Tube secured with: Tape Dental Injury: Teeth and Oropharynx as per pre-operative assessment

## 2018-06-23 NOTE — Anesthesia Postprocedure Evaluation (Signed)
Anesthesia Post Note  Patient: Flo Rodriguez-Guzman  Procedure(s) Performed: DILATATION & CURETTAGE/HYSTEROSCOPY WITH MYOSURE (N/A )  Patient location during evaluation: PACU Anesthesia Type: General Level of consciousness: awake and alert and oriented Pain management: pain level controlled Vital Signs Assessment: post-procedure vital signs reviewed and stable Respiratory status: spontaneous breathing, nonlabored ventilation and respiratory function stable Cardiovascular status: blood pressure returned to baseline and stable Postop Assessment: no signs of nausea or vomiting Anesthetic complications: no     Last Vitals:  Vitals:   06/23/18 1140 06/23/18 1217  BP: (!) 137/92 132/90  Pulse: 95 98  Resp: 16 16  Temp: 36.9 C 37.2 C  SpO2: 100%     Last Pain:  Vitals:   06/23/18 1217  TempSrc: Tympanic  PainSc: 0-No pain                 Adalberto Metzgar

## 2018-06-23 NOTE — Op Note (Signed)
Operative Note   06/23/2018  PRE-OP DIAGNOSIS:  1) Menorrhagia with regular cycle 2) Endometrial polyp 3) Iron deficiency anemia   POST-OP DIAGNOSIS:  1) Menorrhagia with regular cycle 2) No obvious evidence of endometrial polyp 3) Iron deficiency anemia   SURGEON: Surgeon(s) and Role:    Will Bonnet, MD - Primary  PROCEDURE: Procedure(s): 1) DILATATION & CURETTAGE 2) HYSTEROSCOPY   ANESTHESIA: General  ESTIMATED BLOOD LOSS: 25 mL  DRAINS: none   TOTAL IV FLUIDS: 600 mL crystalloid  SPECIMENS:  Endometrial curettings  VTE PROPHYLAXIS: SCDs to the bilateral lower extremities  ANTIBIOTICS: none indicated, none given  FLUID DEFICIT: 841 mL  COMPLICATIONS: none  DISPOSITION: PACU - hemodynamically stable.  CONDITION: stable  INDICATION: 43 y.o. G0P0000 female with history of regular, heavy menses. She had an endometrial biopsy showing an endometrial polyp and she had a thickened endometrium (~20 mm).    FINDINGS: Exam under anesthesia revealed small, mobile anteverted uterus with no masses and bilateral adnexa without masses or fullness. Hysteroscopy revealed a grossly normal appearing uterine cavity with shaggy anterior endometrium, with no obvious evidence of a polyp.  The bilateral tubal ostia and normal appearing endocervical canal.  PROCEDURE IN DETAIL:  After informed consent was obtained, the patient was taken to the operating room where anesthesia was obtained without difficulty. The patient was positioned in the dorsal lithotomy position in [place].  The patient's bladder was catheterized with an in and out foley catheter.  The patient was examined under anesthesia, with the above noted findings.  The bi-valved speculum was placed inside the patient's vagina, and the the anterior lip of the cervix was seen and grasped with the tenaculum.  The cervix was progressively dilated to a 7 mm Hegar dilator.  The hysteroscope was introduced, with the above  noted findings.  The MyoSure Light device was inserted through the hysteroscopic operative port.  The endometrium was sampled and trimmed as much as possible with the device. The device and hysteroscope were removed and the uterine cavity was curetted until a gritty texture was noted, yielding a large amount of  endometrial curettings.  The hysteroscope was re-introduced and using the MyoSure Light device, any excess was thickened endometrium was removed.  On visualization from further away it did not appear the posterior middle endometrium had been sampled. However, upon closer inspection with hysteroscopy the area had been well-sampled.  The hysteroscope was removed after lowering intra-uterine fluid pressure to a level well below that of the MAP.  Excellent hemostasis was noted.  The hysteroscope was removed and the single-tooth tenaculum was removed from the cervix. Silver nitrate was utilized to ensure hemostasis.  After verifying no other remaining instruments or sponges, the speculum was removed.    She was then taken out of dorsal lithotomy.  The patient tolerated the procedure well.  Sponge, lap and needle counts were correct x2.  VTE prophylaxis: SCDs.  The patient was taken to recovery room in excellent condition.  Will Bonnet, MD, [place] 06/23/2018 10:35 AM

## 2018-06-23 NOTE — Interval H&P Note (Signed)
History and Physical Interval Note:  06/23/2018 9:29 AM  Angela [name]  has presented today for surgery, with the diagnosis of MENORRHAGIA WITH IRREGULAR CYCLE  The various methods of treatment have been discussed with the patient and family. After consideration of risks, benefits and other options for treatment, the patient has consented to  Procedure(s): DILATATION & CURETTAGE/HYSTEROSCOPY, POLYPECTOMY (N/A) as a surgical intervention .  The patient's history has been reviewed, patient examined, no change in status, stable for surgery.  I have reviewed the patient's chart and labs.  Questions were answered to the patient's satisfaction.  The consents were reviewed and the patient agreed to proceed.  Prentice Docker, MD, [facility], [facility] 06/23/2018 9:30 AM

## 2018-06-23 NOTE — Transfer of Care (Signed)
Immediate Anesthesia Transfer of Care Note  Patient: Angela [name]  Procedure(s) Performed: DILATATION & CURETTAGE/HYSTEROSCOPY WITH MYOSURE (N/A )  Patient Location: PACU  Anesthesia Type:General  Level of Consciousness: drowsy  Airway & Oxygen Therapy: Patient Spontanous Breathing and Patient connected to face mask oxygen  Post-op Assessment: Report given to RN and Post -op Vital signs reviewed and stable  Post vital signs: Reviewed and stable  Last Vitals:  Vitals Value Taken Time  BP    Temp    Pulse 80 06/23/2018 10:44 AM  Resp 15 06/23/2018 10:44 AM  SpO2 100 % 06/23/2018 10:44 AM  Vitals shown include unvalidated device data.  Last Pain:  Vitals:   06/23/18 0851  TempSrc: Oral  PainSc: 4          Complications: No apparent anesthesia complications

## 2018-06-24 ENCOUNTER — Telehealth: Payer: Self-pay

## 2018-06-24 LAB — SURGICAL PATHOLOGY

## 2018-06-24 NOTE — Telephone Encounter (Signed)
Angela [name] calling.  Pt had procedure done yesterday and is having trouble swallowing liquids.  Is there any action to take or will it resolve itself?  [phone]

## 2018-06-24 NOTE — Telephone Encounter (Signed)
Talked to Altamese Dilling who is on her ROI list.   He states the patient has had trouble swallowing large amounts of liquid. She is able to swallow small amounts and she is able to swallow food. When she attempts to swallow larger amounts of fluid she chokes and has to spit the fluid back out. She is able to breath, but does have a sore throat. She has tried ibuprofen and throat lozenges.  Discussed with Dr. Rosey Bath, who recommended adding a salt water gargle to the regimen. I added that if things got worse and she wasn't able to swallow at all or things got worse or she began having difficulty breathing, she should go immediately to her nearest ER.   She was on speaker phone with Altamese Dilling and she was able to say she understood.  All questions answered.  Prentice Docker, MD, [facility], [facility] 06/24/2018 5:51 PM

## 2018-06-26 DIAGNOSIS — J060 Acute laryngopharyngitis: Secondary | ICD-10-CM | POA: Diagnosis not present

## 2018-06-27 MED FILL — COMBIGAN EYE DROPS: 0.2-0.5 | 30 days supply | Qty: 5 | Fill #3

## 2018-06-29 MED FILL — LATANOPROST 0.005% EYE DRP: 0.005 | 50 days supply | Qty: 3 | Fill #3

## 2018-07-04 ENCOUNTER — Encounter: Payer: Self-pay | Admitting: Primary Care

## 2018-07-04 ENCOUNTER — Other Ambulatory Visit: Payer: Self-pay

## 2018-07-04 ENCOUNTER — Ambulatory Visit (INDEPENDENT_AMBULATORY_CARE_PROVIDER_SITE_OTHER): Payer: 59 | Admitting: Primary Care

## 2018-07-04 VITALS — BP 124/84 | HR 96 | Temp 98.9°F | Ht 69.0 in | Wt 225.5 lb

## 2018-07-04 DIAGNOSIS — R490 Dysphonia: Secondary | ICD-10-CM | POA: Diagnosis not present

## 2018-07-04 DIAGNOSIS — M62838 Other muscle spasm: Secondary | ICD-10-CM | POA: Insufficient documentation

## 2018-07-04 HISTORY — DX: Other muscle spasm: M62.838

## 2018-07-04 HISTORY — DX: Dysphonia: R49.0

## 2018-07-04 MED ORDER — CYCLOBENZAPRINE HCL 10 MG PO TABS: 10.0000 mg | ORAL_TABLET | Freq: Three times a day (TID) | ORAL | 0 refills | Status: DC | PRN

## 2018-07-04 NOTE — Assessment & Plan Note (Signed)
Secondary to intubation from surgery on 06/23/18. Exam today overall benign. Agree that she needs voice rest and that her occupation makes it difficult. Work note provided for voice rest for the next three days. Continue lozenges, salt gargles.  She will update.

## 2018-07-04 NOTE — Progress Notes (Signed)
Subjective:    Patient ID: Angela [name], female    DOB: 1976/01/27, 43 y.o.   MRN: [id number]  HPI  Ms. [name] is a 43 year old female who presents today with a chief complaint of voice hoarseness. She would also like a muscle relaxer for left thoracic muscle spasm.   1) Voice Hoarseness: She underwent D&C of uterine fibroid on 06/23/18, intubated during procedure with LMA device. Since then she's noticed continued voice hoarseness, lost her voice last week. 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 During these days her voice is nearly gone by the end of the day. She was told by her GYN to practice voice rest when possible.   She was evaluated at Urgent Care one week ago, provided with prednisone 40 mg x 5 days and completed course. She's tried voice rest, salt water gargles with some improvement. Overall she's noticed improvement. She denies fevers, post nasal drip, cough.   2) Muscle Spasm: Located to left thoracic back since her surgery. Has been less active during recover. Denies injury/trauma. She is requesting Flexeril to use PRN as this has been effective historically.   Review of Systems  Constitutional: Negative for fever.  HENT: Positive for sore throat and voice change. Negative for congestion and postnasal drip.   Respiratory: Negative for cough and shortness of breath.   Musculoskeletal: Positive for myalgias.       Past Medical History:  Diagnosis Date  . Allergy Enviromental  . Anemia may/2017  . Glaucoma   . Heart murmur childhood  . Prediabetes      Social History   Socioeconomic History  . Marital status: Married    Spouse name: Not on file  . Number of children: Not on file  . Years of education: Not on file  . Highest education level: Not on file  Occupational History  . Occupation: Nurse, adult  Social Needs  . Financial resource strain: Not on file  . Food insecurity:    Worry: Not on  file    Inability: Not on file  . Transportation needs:    Medical: Not on file    Non-medical: Not on file  Tobacco Use  . Smoking status: Never Smoker  . Smokeless tobacco: Never Used  Substance and Sexual Activity  . Alcohol use: Yes    Alcohol/week: 2.0 standard drinks    Types: 2 Glasses of wine per week    Comment: occasional  . Drug use: No  . Sexual activity: Yes    Birth control/protection: None  Lifestyle  . Physical activity:    Days per week: 0 days    Minutes per session: Not on file  . Stress: Not on file  Relationships  . Social connections:    Talks on phone: Not on file    Gets together: Not on file    Attends religious service: Not on file    Active member of club or organization: Not on file    Attends meetings of clubs or organizations: Not on file    Relationship status: Not on file  . Intimate partner violence:    Fear of current or ex partner: Not on file    Emotionally abused: Not on file    Physically abused: Not on file    Forced sexual activity: Not on file  Other Topics Concern  . Not on file  Social History Narrative   Married.   No children.   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   Enjoys watching movies, walking, reading.     Past Surgical History:  Procedure Laterality Date  . CHOLECYSTECTOMY  2005  . DILATATION & CURETTAGE/HYSTEROSCOPY WITH MYOSURE N/A 06/23/2018   Procedure: DILATATION & CURETTAGE/HYSTEROSCOPY WITH MYOSURE;  Surgeon: Will Bonnet, MD;  Location: [facility] ORS;  Service: Gynecology;  Laterality: N/A;  . ERCP     3 times  . EYE SURGERY    . MENISCUS REPAIR Right    x 2    Family History  Problem Relation Age of Onset  . Colon cancer Mother 67  . Diabetes Mother   . Hypertension Mother   . Arthritis Mother   . Diabetes Father   . Heart disease Father   . Esophageal cancer Maternal Grandmother   . Cancer Maternal Grandmother   . Diabetes Maternal Grandmother   . Alcohol abuse Maternal Grandfather   . Breast  cancer Neg Hx     No Known Allergies  Current Outpatient Medications on File Prior to Visit  Medication Sig Dispense Refill  . brimonidine-timolol (COMBIGAN) 0.2-0.5 % ophthalmic solution Place 1 drop into the right eye every 12 (twelve) hours.     . cetirizine (ZYRTEC) 10 MG tablet Take 10 mg by mouth daily as needed for allergies.    Marland Kitchen dorzolamide (TRUSOPT) 2 % ophthalmic solution Place 1 drop into the right eye 3 (three) times daily.    . fluticasone (FLONASE) 50 MCG/ACT nasal spray Place 1 spray into both nostrils 2 (two) times daily as needed for allergies.     Marland Kitchen ibuprofen (ADVIL,MOTRIN) 600 MG tablet Take 1 tablet (600 mg total) by mouth every 6 (six) hours as needed for mild pain, moderate pain or cramping. 30 tablet 0  . latanoprost (XALATAN) 0.005 % ophthalmic solution Place 1 drop into the right eye at bedtime.     . Psyllium (METAMUCIL FIBER PO) Take 2 Scoops by mouth at bedtime as needed (constipation).     . ferrous sulfate 325 (65 FE) MG tablet Take 1 tablet (325 mg total) by mouth daily. (Patient taking differently: Take 325 mg by mouth every other day. ) 90 tablet 0   No current facility-administered medications on file prior to visit.     BP 124/84   Pulse 96   Temp 98.9 F (37.2 C)   Ht 5\' 9"  (1.753 m)   Wt 225 lb 8 oz (102.3 kg)   LMP 05/30/2018   SpO2 99%   BMI 33.30 kg/m    Objective:   Physical Exam  Constitutional: She appears well-nourished. She does not appear ill.  HENT:  Right Ear: Tympanic membrane and ear canal normal.  Left Ear: Tympanic membrane and ear canal normal.  Nose: No mucosal edema. Right sinus exhibits no maxillary sinus tenderness and no frontal sinus tenderness. Left sinus exhibits no maxillary sinus tenderness and no frontal sinus tenderness.  Mouth/Throat: Oropharynx is clear and moist. No oropharyngeal exudate, posterior oropharyngeal edema or posterior oropharyngeal erythema.  Neck: Neck supple.  Mild swelling to left anterior  neck  Cardiovascular: Normal rate and regular rhythm.  Respiratory: Effort normal and breath sounds normal. She has no wheezes.  Musculoskeletal:     Thoracic back: She exhibits spasm. She exhibits normal range of motion.       Back:     Comments: Moderate muscle tightness with spasm to left thoracic back.  Lymphadenopathy:    She has no cervical adenopathy.  Skin: Skin is warm and dry.  Assessment & Plan:

## 2018-07-04 NOTE — Assessment & Plan Note (Signed)
Acute to left thoracic back. Likely as reaction from resting during surgery and recovery. Rx for Flexeril course provided.

## 2018-07-04 NOTE — Patient Instructions (Signed)
You may take the cyclobenzaprine every 8 hours as needed, caution as this may cause drowsiness.  Rest your voice as discussed. Continue the throat lozenges and salt gargles.  It was a pleasure to see you today!

## 2018-07-12 ENCOUNTER — Other Ambulatory Visit: Payer: Self-pay | Admitting: Obstetrics and Gynecology

## 2018-07-12 DIAGNOSIS — N711 Chronic inflammatory disease of uterus: Secondary | ICD-10-CM

## 2018-07-12 MED ORDER — DOXYCYCLINE HYCLATE 100 MG PO CAPS: 100.0000 mg | ORAL_CAPSULE | Freq: Two times a day (BID) | ORAL | 0 refills | Status: AC

## 2018-07-13 ENCOUNTER — Ambulatory Visit: Payer: 59 | Admitting: Obstetrics and Gynecology

## 2018-07-13 DIAGNOSIS — H4031X3 Glaucoma secondary to eye trauma, right eye, severe stage: Secondary | ICD-10-CM | POA: Diagnosis not present

## 2018-07-16 MED FILL — DORZOLAMIDE HCL 2 % SOLN: 2 | 66 days supply | Qty: 10 | Fill #1

## 2018-07-26 MED FILL — COMBIGAN EYE DROPS: 0.2-0.5 | 30 days supply | Qty: 5 | Fill #4

## 2018-08-10 MED FILL — LATANOPROST 0.005% EYE DRP: 0.005 | 50 days supply | Qty: 3 | Fill #4

## 2018-08-11 ENCOUNTER — Telehealth: Payer: Self-pay | Admitting: Gastroenterology

## 2018-08-11 NOTE — Telephone Encounter (Signed)
Patient called just checking in & would like to schedule colonoscopy when where able to.

## 2018-08-12 ENCOUNTER — Other Ambulatory Visit: Payer: Self-pay

## 2018-08-12 DIAGNOSIS — Z1211 Encounter for screening for malignant neoplasm of colon: Secondary | ICD-10-CM

## 2018-08-12 DIAGNOSIS — Z800 Family history of malignant neoplasm of digestive organs: Secondary | ICD-10-CM

## 2018-08-12 DIAGNOSIS — Z8371 Family history of colonic polyps: Secondary | ICD-10-CM

## 2018-08-12 NOTE — Telephone Encounter (Signed)
Gastroenterology Pre-Procedure Review  Request Date: 07/09 Requesting Physician: Dr. Marius Ditch  PATIENT REVIEW QUESTIONS: The patient responded to the following health history questions as indicated:    1. Are you having any GI issues? YES, CHRONIC CONSTIPATION, BLEEDING HEMORRHOIDS (DECLINED APPT) 2. Do you have a personal history of Polyps? NO 3. Do you have a family history of Colon Cancer or Polyps? YES FATHER COLON CANCER, BROTHER POLYPS 4. Diabetes Mellitus? NO 5. Joint replacements in the past 12 months?NO 6. Major health problems in the past 3 months?DNC MONTH AND HALF AGO 7. Any artificial heart valves, MVP, or defibrillator?NO    MEDICATIONS & ALLERGIES:    Patient reports the following regarding taking any anticoagulation/antiplatelet therapy:   Plavix, Coumadin, Eliquis, Xarelto, Lovenox, Pradaxa, Brilinta, or Effient?NO Aspirin? NO  Patient confirms/reports the following medications:  Current Outpatient Medications  Medication Sig Dispense Refill  . brimonidine-timolol (COMBIGAN) 0.2-0.5 % ophthalmic solution Place 1 drop into the right eye every 12 (twelve) hours.     . cetirizine (ZYRTEC) 10 MG tablet Take 10 mg by mouth daily as needed for allergies.    . cyclobenzaprine (FLEXERIL) 10 MG tablet Take 1 tablet (10 mg total) by mouth 3 (three) times daily as needed for muscle spasms. 15 tablet 0  . dorzolamide (TRUSOPT) 2 % ophthalmic solution Place 1 drop into the right eye 3 (three) times daily.    . ferrous sulfate 325 (65 FE) MG tablet Take 1 tablet (325 mg total) by mouth daily. (Patient taking differently: Take 325 mg by mouth every other day. ) 90 tablet 0  . fluticasone (FLONASE) 50 MCG/ACT nasal spray Place 1 spray into both nostrils 2 (two) times daily as needed for allergies.     Marland Kitchen ibuprofen (ADVIL,MOTRIN) 600 MG tablet Take 1 tablet (600 mg total) by mouth every 6 (six) hours as needed for mild pain, moderate pain or cramping. 30 tablet 0  . latanoprost (XALATAN)  0.005 % ophthalmic solution Place 1 drop into the right eye at bedtime.     . Psyllium (METAMUCIL FIBER PO) Take 2 Scoops by mouth at bedtime as needed (constipation).      No current facility-administered medications for this visit.     Patient confirms/reports the following allergies:  No Known Allergies  No orders of the defined types were placed in this encounter.   AUTHORIZATION INFORMATION Primary Insurance: 1D#: Group #:  Secondary Insurance: 1D#: Group #:  SCHEDULE INFORMATION: Date: 07/09 Time: Location:[facility]

## 2018-08-24 ENCOUNTER — Telehealth: Payer: 59 | Admitting: Nurse Practitioner

## 2018-08-24 DIAGNOSIS — N3000 Acute cystitis without hematuria: Secondary | ICD-10-CM

## 2018-08-24 MED ORDER — NITROFURANTOIN MONOHYD MACRO 100 MG PO CAPS: 100.0000 mg | ORAL_CAPSULE | Freq: Two times a day (BID) | ORAL | 0 refills | Status: DC

## 2018-08-24 NOTE — Progress Notes (Signed)

## 2018-09-01 ENCOUNTER — Telehealth: Payer: Self-pay | Admitting: Gastroenterology

## 2018-09-01 NOTE — Telephone Encounter (Signed)
Received fax from [facility] stating colonoscopy cpt code [phone] medically necessary means the service meets accepted standards of medicine.  Service Ref# [id number]. This is recommendation is efftive 10-27-2018.

## 2018-09-20 MED FILL — COMBIGAN EYE DROPS: 0.2-0.5 | 30 days supply | Qty: 5 | Fill #5

## 2018-09-20 MED FILL — LATANOPROST 0.005% EYE DRP: 0.005 | 50 days supply | Qty: 3 | Fill #5

## 2018-09-20 MED FILL — DORZOLAMIDE HCL 2 % SOLN: 2 | 66 days supply | Qty: 10 | Fill #2

## 2018-09-28 ENCOUNTER — Other Ambulatory Visit: Payer: Self-pay

## 2018-09-28 ENCOUNTER — Ambulatory Visit
Admission: RE | Admit: 2018-09-28 | Discharge: 2018-09-28 | Disposition: A | Discharge status: Home / Self Care | Payer: 59 | Source: Ambulatory Visit | Attending: Primary Care | Admitting: Primary Care

## 2018-09-28 DIAGNOSIS — Z1231 Encounter for screening mammogram for malignant neoplasm of breast: Secondary | ICD-10-CM | POA: Diagnosis not present

## 2018-09-28 DIAGNOSIS — Z1239 Encounter for other screening for malignant neoplasm of breast: Secondary | ICD-10-CM

## 2018-10-05 ENCOUNTER — Ambulatory Visit (INDEPENDENT_AMBULATORY_CARE_PROVIDER_SITE_OTHER): Payer: 59 | Admitting: Family Medicine

## 2018-10-05 ENCOUNTER — Other Ambulatory Visit: Payer: Self-pay

## 2018-10-05 ENCOUNTER — Encounter (INDEPENDENT_AMBULATORY_CARE_PROVIDER_SITE_OTHER): Payer: Self-pay | Admitting: Family Medicine

## 2018-10-05 VITALS — BP 102/69 | HR 83 | Temp 98.1°F | Ht 68.0 in | Wt 220.0 lb

## 2018-10-05 DIAGNOSIS — Z1331 Encounter for screening for depression: Secondary | ICD-10-CM

## 2018-10-05 DIAGNOSIS — R7303 Prediabetes: Secondary | ICD-10-CM | POA: Diagnosis not present

## 2018-10-05 DIAGNOSIS — R5383 Other fatigue: Secondary | ICD-10-CM

## 2018-10-05 DIAGNOSIS — Z6833 Body mass index (BMI) 33.0-33.9, adult: Secondary | ICD-10-CM

## 2018-10-05 DIAGNOSIS — D508 Other iron deficiency anemias: Secondary | ICD-10-CM

## 2018-10-05 DIAGNOSIS — R0602 Shortness of breath: Secondary | ICD-10-CM

## 2018-10-05 DIAGNOSIS — Z9189 Other specified personal risk factors, not elsewhere classified: Secondary | ICD-10-CM

## 2018-10-05 DIAGNOSIS — E669 Obesity, unspecified: Secondary | ICD-10-CM

## 2018-10-05 DIAGNOSIS — Z0289 Encounter for other administrative examinations: Secondary | ICD-10-CM

## 2018-10-06 NOTE — Progress Notes (Signed)
.  Office: [phone]  /  Fax: [phone]   HPI:   Chief Complaint: OBESITY  Angela [name] (MR# [id number]) is a 43 y.o. female who presents on 10/05/2018 for obesity evaluation and treatment. Current BMI is Body mass index is 33.45 kg/m. Angela [name] has struggled with obesity for years and has been unsuccessful in either losing weight or maintaining long term weight loss. Angela [name] attended our information session and states she is currently in the action stage of change and ready to dedicate time achieving and maintaining a healthier weight.   Angela [name] heard about our clinic from co-workers at [employer]. For breakfast, she is doing 2 slices of toast, peanut butter and honey or fruit preserve, coffee with almond milk (satisfied). She is doing 1-2 cups of coffee at work +/- snack (100 calorie nuts). For lunch, she is doing fried rice, strawberries, and water. Afternoon snack is [place] yogurt, at home chocolate [employer], 4). For supper, she is doing 3-4 oz of grilled chicken, spaghetti and sauce, and lemon pie slice (feels full from dinner).  Angela [name] states her family eats meals together she thinks her family will eat healthier with  her her desired weight loss is 45 lbs she has been heavy most of  her life her heaviest weight ever was 227 lbs she has significant food cravings issues  she is frequently drinking liquids with calories she frequently makes poor food choices she has problems with excessive hunger  she frequently eats larger portions than normal  she struggles with emotional eating    Fatigue Angela [name] feels her energy is lower than it should be. This has worsened with weight gain and has not worsened recently. Angela [name] admits to daytime somnolence and  denies waking up still tired. Patient is at risk for obstructive sleep apnea. Patent has a history of symptoms of daytime fatigue. Patient generally gets 8 hours of sleep per night, and states they generally have generally  restful sleep. Snoring is not present. Apneic episodes are not present. Epworth Sleepiness Score is 1.  Dyspnea on exertion Angela [name] notes increasing shortness of breath with exercising and seems to be worsening over time with weight gain. She notes getting out of breath sooner with activity than she used to. This has not gotten worse recently. EKG-normal sinus rhythm at 82 BPM. Angela [name] denies orthopnea.  Pre-Diabetes Angela [name] has a diagnosis of pre-diabetes has been prediabetic for 3-4 years. She was informed this puts her at greater risk of developing diabetes. She is not on any medications and continues to work on diet and exercise to decrease risk of diabetes. She denies nausea or hypoglycemia.  At risk for diabetes Angela [name] is at higher than average risk for developing diabetes due to her obesity and pre-diabetes. She currently denies polyuria or polydipsia.  Iron Deficiency Anemia Angela [name] has a diagnosis of anemia. She is on daily iron supplementation. Her last CBC was within normal limits.   Depression Screen Angela [name]'s Food and Mood (modified PHQ-9) score was  Depression screen [facility] 2/9 08/31/2016  Decreased Interest 0  Down, Depressed, Hopeless 0  PHQ - 2 Score 0  Altered sleeping 1  Tired, decreased energy 1  Change in appetite 1  Feeling bad or failure about yourself  0  Trouble concentrating 0  Moving slowly or fidgety/restless 0  Suicidal thoughts 0  PHQ-9 Score 3  Difficult doing work/chores Not difficult at all    ASSESSMENT AND PLAN:  Other fatigue - Plan: EKG 12-Lead, Vitamin B12, T3, T4, free, TSH, Folate,  VITAMIN D 25 Hydroxy (Vit-D Deficiency, Fractures)  Shortness of breath on exertion - Plan: Lipid Panel With LDL/HDL Ratio  Prediabetes - Plan: Comprehensive metabolic panel, Hemoglobin A1c, Insulin, random  Other iron deficiency anemia - Plan: CBC With Differential  Depression screening  At risk for diabetes mellitus  Class 1 obesity with serious comorbidity and  body mass index (BMI) of 33.0 to 33.9 in adult, unspecified obesity type  PLAN:  Fatigue Angela [name] was informed that her fatigue may be related to obesity, depression or many other causes. Labs will be ordered, and in the meanwhile Angela [name] has agreed to work on diet, exercise and weight loss to help with fatigue. Proper sleep hygiene was discussed including the need for 7-8 hours of quality sleep each night. A sleep study was not ordered based on symptoms and Epworth score.  Dyspnea on exertion Angela [name]'s shortness of breath appears to be obesity related and exercise induced. She has agreed to work on weight loss and gradually increase exercise to treat her exercise induced shortness of breath. If Angela [name] follows our instructions and loses weight without improvement of her shortness of breath, we will plan to refer to pulmonology. We will monitor this condition regularly. Malayia agrees to this plan.  Pre-Diabetes Angela [name] will continue to work on weight loss, exercise, and decreasing simple carbohydrates in her diet to help decrease the risk of diabetes. We dicussed metformin including benefits and risks. She was informed that eating too many simple carbohydrates or too many calories at one sitting increases the likelihood of GI side effects. Angela [name] declined metformin for now and a prescription was not written today. We will check Hgb A1c and insulin level today. Angela [name] agrees to follow up with our clinic in 2 weeks as directed to monitor her progress.  Diabetes risk counseling Angela [name] was given extended (15 minutes) diabetes prevention counseling today. She is 43 y.o. female and has risk factors for diabetes including obesity and pre-diabetes. We discussed intensive lifestyle modifications today with an emphasis on weight loss as well as increasing exercise and decreasing simple carbohydrates in her diet.  Iron Deficiency Anemia The diagnosis of Iron deficiency anemia was discussed with Angela [name] and was  explained in detail. She was given suggestions of iron rich foods and iron supplement was not prescribed. We will check CBC and anemia panel today. Angela [name] agrees to follow up with our clinic in 2 weeks.  Depression Screen Angela [name] had a negative depression screening. Depression is commonly associated with obesity and often results in emotional eating behaviors. We will monitor this closely and work on CBT to help improve the non-hunger eating patterns. Referral to Psychology may be required if no improvement is seen as she continues in our clinic.  Obesity Angela [name] is currently in the action stage of change and her goal is to continue with weight loss efforts She has agreed to follow the Category 3 plan Angela [name] has been instructed to work up to a goal of 150 minutes of combined cardio and strengthening exercise per week for weight loss and overall health benefits. We discussed the following Behavioral Modification Strategies today: increasing lean protein intake, increasing vegetables and work on meal planning and easy cooking plans, keeping healthy foods in the home, better snacking choices, and planning for success  Angela [name] has agreed to follow up with our clinic in 2 weeks. She was informed of the importance of frequent follow up visits to maximize her success with intensive lifestyle modifications for her multiple health conditions. She was informed  we would discuss her lab results at her next visit unless there is a critical issue that needs to be addressed sooner. Angela [name] agreed to keep her next visit at the agreed upon time to discuss these results.  ALLERGIES: Allergies  Allergen Reactions   Quinoa-Kale-Hemp [Alimentum]     MEDICATIONS: Current Outpatient Medications on File Prior to Visit  Medication Sig Dispense Refill   brimonidine-timolol (COMBIGAN) 0.2-0.5 % ophthalmic solution Place 1 drop into the right eye every 12 (twelve) hours.      cetirizine (ZYRTEC) 10 MG tablet Take 10 mg  by mouth daily as needed for allergies.     dorzolamide (TRUSOPT) 2 % ophthalmic solution Place 1 drop into the right eye 3 (three) times daily.     fluticasone (FLONASE) 50 MCG/ACT nasal spray Place 1 spray into both nostrils 2 (two) times daily as needed for allergies.      ibuprofen (ADVIL,MOTRIN) 600 MG tablet Take 1 tablet (600 mg total) by mouth every 6 (six) hours as needed for mild pain, moderate pain or cramping. 30 tablet 0   latanoprost (XALATAN) 0.005 % ophthalmic solution Place 1 drop into the right eye at bedtime.      ferrous sulfate 325 (65 FE) MG tablet Take 1 tablet (325 mg total) by mouth daily. (Patient taking differently: Take 325 mg by mouth every other day. ) 90 tablet 0   No current facility-administered medications on file prior to visit.     PAST MEDICAL HISTORY: Past Medical History:  Diagnosis Date   Allergy Enviromental   Anemia may/2017   Asthma    as a child   Back pain gerd   Constipation    Glaucoma    Heart murmur childhood   IBS (irritable bowel syndrome)    Joint pain    Menorrhagia    Palpitations    Plantar fasciitis    Prediabetes    Swelling    feet, legs    PAST SURGICAL HISTORY: Past Surgical History:  Procedure Laterality Date   CHOLECYSTECTOMY  2005   DILATATION & CURETTAGE/HYSTEROSCOPY WITH MYOSURE N/A 06/23/2018   Procedure: DILATATION & CURETTAGE/HYSTEROSCOPY WITH MYOSURE;  Surgeon: Will Bonnet, MD;  Location: [facility] ORS;  Service: Gynecology;  Laterality: N/A;   ERCP     3 times   EYE SURGERY     MENISCUS REPAIR Right    x 2    SOCIAL HISTORY: Social History   Tobacco Use   Smoking status: Never Smoker   Smokeless tobacco: Never Used  Substance Use Topics   Alcohol use: Yes    Alcohol/week: 2.0 standard drinks    Types: 2 Glasses of wine per week    Comment: occasional   Drug use: No    FAMILY HISTORY: Family History  Problem Relation Age of Onset   Colon cancer Mother 17    Diabetes Mother    Hypertension Mother    Arthritis Mother    Transient ischemic attack Mother    Liver disease Mother    Obesity Mother    Diabetes Father    Heart disease Father    Esophageal cancer Maternal Grandmother    Cancer Maternal Grandmother    Diabetes Maternal Grandmother    Alcohol abuse Maternal Grandfather    Breast cancer Maternal Aunt        mat great aunt    ROS: Review of Systems  Constitutional: Positive for malaise/fatigue. Negative for weight loss.  Eyes:       +  Wear glasses or contacts  Respiratory: Positive for shortness of breath (with exertion).   Cardiovascular: Negative for orthopnea.  Gastrointestinal: Positive for constipation. Negative for nausea.  Genitourinary: Negative for frequency.  Musculoskeletal:       + Muscle or joint pain  Neurological: Positive for headaches.  Endo/Heme/Allergies: Negative for polydipsia.       Negative hypoglycemia + Excessive hunger    PHYSICAL EXAM: Blood pressure 102/69, pulse 83, temperature 98.1 F (36.7 C), height 5\' 8"  (1.727 m), weight 220 lb (99.8 kg), last menstrual period 09/22/2018, SpO2 99 %. Body mass index is 33.45 kg/m. Physical Exam Vitals signs reviewed.  Constitutional:      Appearance: Normal appearance. She is obese.  HENT:     Head: Normocephalic and atraumatic.     Nose: Nose normal.  Eyes:     General: No scleral icterus.    Extraocular Movements: Extraocular movements intact.  Neck:     Musculoskeletal: Normal range of motion and neck supple.     Comments: Negative thyromegaly present Cardiovascular:     Rate and Rhythm: Normal rate and regular rhythm.     Pulses: Normal pulses.     Heart sounds: Normal heart sounds.  Pulmonary:     Effort: Pulmonary effort is normal. No respiratory distress.     Breath sounds: Normal breath sounds.  Abdominal:     Palpations: Abdomen is soft.     Tenderness: There is no abdominal tenderness.     Comments: + Obesity    Musculoskeletal: Normal range of motion.     Right lower leg: No edema.     Left lower leg: No edema.  Skin:    General: Skin is warm and dry.  Neurological:     Mental Status: She is alert and oriented to person, place, and time.     Coordination: Coordination normal.  Psychiatric:        Mood and Affect: Mood normal.        Behavior: Behavior normal.     RECENT LABS AND TESTS: BMET    Component Value Date/Time   NA 138 10/05/2018 1005   K 4.7 10/05/2018 1005   CL 104 10/05/2018 1005   CO2 23 10/05/2018 1005   GLUCOSE 99 10/05/2018 1005   GLUCOSE 105 (H) 06/13/2018 1006   BUN 13 10/05/2018 1005   CREATININE 0.56 (L) 10/05/2018 1005   CALCIUM 9.4 10/05/2018 1005   GFRNONAA 115 10/05/2018 1005   GFRAA 132 10/05/2018 1005   Lab Results  Component Value Date   HGBA1C WILL FOLLOW 10/05/2018   Lab Results  Component Value Date   INSULIN 61.1 (H) 10/05/2018   CBC    Component Value Date/Time   WBC 5.8 10/05/2018 1005   WBC 5.4 09/22/2017 0957   RBC 4.53 10/05/2018 1005   RBC 4.32 09/22/2017 0957   HGB 11.5 10/05/2018 1005   HCT 37.0 10/05/2018 1005   PLT 182 04/29/2018 1007   MCV 82 10/05/2018 1005   MCH 25.4 (L) 10/05/2018 1005   MCHC 31.1 (L) 10/05/2018 1005   MCHC 34.0 09/22/2017 0957   RDW 15.8 (H) 10/05/2018 1005   LYMPHSABS 1.6 10/05/2018 1005   EOSABS 0.2 10/05/2018 1005   BASOSABS 0.0 10/05/2018 1005   Iron/TIBC/Ferritin/ %Sat    Component Value Date/Time   IRON 40 10/05/2018 1005   TIBC 403 10/05/2018 1005   FERRITIN 20 10/05/2018 1005   IRONPCTSAT 10 (L) 10/05/2018 1005   IRONPCTSAT 7 (L) 09/08/2016 1109  Lipid Panel     Component Value Date/Time   CHOL 156 10/05/2018 1005   TRIG 106 10/05/2018 1005   HDL 61 10/05/2018 1005   CHOLHDL 3 06/22/2018 1113   VLDL 20.6 06/22/2018 1113   LDLCALC 74 10/05/2018 1005   Hepatic Function Panel     Component Value Date/Time   PROT 7.3 10/05/2018 1005   ALBUMIN 4.5 10/05/2018 1005   AST 36  10/05/2018 1005   ALT 46 (H) 10/05/2018 1005   ALKPHOS 92 10/05/2018 1005   BILITOT 0.4 10/05/2018 1005   BILIDIR 0.1 06/22/2018 1113      Component Value Date/Time   TSH 2.020 10/05/2018 1005   Vitamin D No recent labs  ECG  shows NSR with a rate of 82 BPM INDIRECT CALORIMETER done today shows a VO2 of 265 and a REE of 1847. Her calculated basal metabolic rate is 8676 thus her basal metabolic rate is better than expected.       OBESITY BEHAVIORAL INTERVENTION VISIT  Today's visit was # 1   Starting weight: 220 lbs Starting date: 10/05/2018 Today's weight : 220 lbs  Today's date: 10/05/2018 Total lbs lost to date: 0    ASK: We discussed the diagnosis of obesity with Tashina [name] today and Alyse agreed to give [place] permission to discuss obesity behavioral modification therapy today.  ASSESS: Hadlyn has the diagnosis of obesity and her BMI today is 33.46 Aranda is in the action stage of change   ADVISE: Misao was educated on the multiple health risks of obesity as well as the benefit of weight loss to improve her health. She was advised of the need for long term treatment and the importance of lifestyle modifications to improve her current health and to decrease her risk of future health problems.  AGREE: Multiple dietary modification options and treatment options were discussed and  Renuka agreed to follow the recommendations documented in the above note.  ARRANGE: Brexley was educated on the importance of frequent visits to treat obesity as outlined per CMS and USPSTF guidelines and agreed to schedule her next follow up appointment today.   I, Trixie Dredge, am acting as transcriptionist for Ilene Qua, MD   I have reviewed the above documentation for accuracy and completeness, and I agree with the above. - Ilene Qua, MD
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
## 2018-10-07 LAB — T3: T3, Total: 158 ng/dL (ref 71–180)

## 2018-10-07 LAB — CBC WITH DIFFERENTIAL
Basophils Absolute: 0 10*3/uL (ref 0.0–0.2)
Basos: 1 %
EOS (ABSOLUTE): 0.2 10*3/uL (ref 0.0–0.4)
Eos: 3 %
Hemoglobin: 11.5 g/dL (ref 11.1–15.9)
Immature Grans (Abs): 0 10*3/uL (ref 0.0–0.1)
Immature Granulocytes: 0 %
Lymphocytes Absolute: 1.6 10*3/uL (ref 0.7–3.1)
Lymphs: 28 %
MCH: 25.4 pg — ABNORMAL LOW (ref 26.6–33.0)
MCHC: 31.1 g/dL — ABNORMAL LOW (ref 31.5–35.7)
MCV: 82 fL (ref 79–97)
Monocytes Absolute: 0.4 10*3/uL (ref 0.1–0.9)
Monocytes: 6 %
Neutrophils Absolute: 3.6 10*3/uL (ref 1.4–7.0)
Neutrophils: 62 %
RBC: 4.53 x10E6/uL (ref 3.77–5.28)
RDW: 15.8 % — ABNORMAL HIGH (ref 11.7–15.4)
WBC: 5.8 10*3/uL (ref 3.4–10.8)

## 2018-10-07 LAB — FOLATE: Folate: 4.6 ng/mL (ref >3.0)

## 2018-10-07 LAB — COMPREHENSIVE METABOLIC PANEL
ALT: 46 IU/L — ABNORMAL HIGH (ref 0–32)
AST: 36 IU/L (ref 0–40)
Albumin/Globulin Ratio: 1.6 (ref 1.2–2.2)
Albumin: 4.5 g/dL (ref 3.8–4.8)
Alkaline Phosphatase: 92 IU/L (ref 39–117)
BUN/Creatinine Ratio: 23 (ref 9–23)
BUN: 13 mg/dL (ref 6–24)
Bilirubin Total: 0.4 mg/dL (ref 0.0–1.2)
CO2: 23 mmol/L (ref 20–29)
Calcium: 9.4 mg/dL (ref 8.7–10.2)
Chloride: 104 mmol/L (ref 96–106)
Creatinine, Ser: 0.56 mg/dL — ABNORMAL LOW (ref 0.57–1.00)
GFR calc Af Amer: 132 mL/min/{1.73_m2} (ref >59)
GFR calc non Af Amer: 115 mL/min/{1.73_m2} (ref >59)
Globulin, Total: 2.8 g/dL (ref 1.5–4.5)
Glucose: 99 mg/dL (ref 65–99)
Potassium: 4.7 mmol/L (ref 3.5–5.2)
Sodium: 138 mmol/L (ref 134–144)
Total Protein: 7.3 g/dL (ref 6.0–8.5)

## 2018-10-07 LAB — ANEMIA PANEL
Ferritin: 20 ng/mL (ref 15–150)
Folate, Hemolysate: 316 ng/mL
Folate, RBC: 854 ng/mL (ref >498)
Hematocrit: 37 % (ref 34.0–46.6)
Iron Saturation: 10 % — ABNORMAL LOW (ref 15–55)
Iron: 40 ug/dL (ref 27–159)
Retic Ct Pct: 1.8 % (ref 0.6–2.6)
Total Iron Binding Capacity: 403 ug/dL (ref 250–450)
UIBC: 363 ug/dL (ref 131–425)
Vitamin B-12: 514 pg/mL (ref 232–1245)

## 2018-10-07 LAB — LIPID PANEL WITH LDL/HDL RATIO
Cholesterol, Total: 156 mg/dL (ref 100–199)
HDL: 61 mg/dL (ref >39)
LDL Calculated: 74 mg/dL (ref 0–99)
LDl/HDL Ratio: 1.2 ratio (ref 0.0–3.2)
Triglycerides: 106 mg/dL (ref 0–149)
VLDL Cholesterol Cal: 21 mg/dL (ref 5–40)

## 2018-10-07 LAB — HEMOGLOBIN A1C
Est. average glucose Bld gHb Est-mCnc: 117 mg/dL
Hgb A1c MFr Bld: 5.7 % — ABNORMAL HIGH (ref 4.8–5.6)

## 2018-10-07 LAB — TSH: TSH: 2.02 u[IU]/mL (ref 0.450–4.500)

## 2018-10-07 LAB — VITAMIN D 25 HYDROXY (VIT D DEFICIENCY, FRACTURES): Vit D, 25-Hydroxy: 11.4 ng/mL — ABNORMAL LOW (ref 30.0–100.0)

## 2018-10-07 LAB — INSULIN, RANDOM: INSULIN: 61.1 u[IU]/mL — ABNORMAL HIGH (ref 2.6–24.9)

## 2018-10-07 LAB — T4, FREE: Free T4: 0.91 ng/dL (ref 0.82–1.77)

## 2018-10-10 ENCOUNTER — Other Ambulatory Visit: Payer: Self-pay

## 2018-10-10 ENCOUNTER — Ambulatory Visit (INDEPENDENT_AMBULATORY_CARE_PROVIDER_SITE_OTHER): Payer: 59 | Admitting: Psychology

## 2018-10-10 DIAGNOSIS — F3289 Other specified depressive episodes: Secondary | ICD-10-CM | POA: Diagnosis not present

## 2018-10-15 ENCOUNTER — Encounter (INDEPENDENT_AMBULATORY_CARE_PROVIDER_SITE_OTHER): Payer: Self-pay | Admitting: Family Medicine

## 2018-10-17 DIAGNOSIS — H4031X3 Glaucoma secondary to eye trauma, right eye, severe stage: Secondary | ICD-10-CM | POA: Diagnosis not present

## 2018-10-18 NOTE — Telephone Encounter (Signed)
Please advise 

## 2018-10-18 NOTE — Telephone Encounter (Signed)
Please advise patient.  

## 2018-10-24 ENCOUNTER — Other Ambulatory Visit: Payer: Self-pay

## 2018-10-24 ENCOUNTER — Other Ambulatory Visit
Admission: RE | Admit: 2018-10-24 | Discharge: 2018-10-24 | Disposition: A | Discharge status: Home / Self Care | Payer: 59 | Source: Ambulatory Visit | Attending: Gastroenterology | Admitting: Gastroenterology

## 2018-10-24 DIAGNOSIS — Z1159 Encounter for screening for other viral diseases: Secondary | ICD-10-CM | POA: Insufficient documentation

## 2018-10-24 DIAGNOSIS — Z01812 Encounter for preprocedural laboratory examination: Secondary | ICD-10-CM | POA: Insufficient documentation

## 2018-10-25 ENCOUNTER — Telehealth: Payer: Self-pay | Admitting: Gastroenterology

## 2018-10-25 LAB — SARS CORONAVIRUS 2 (TAT 6-24 HRS): SARS Coronavirus 2: NEGATIVE

## 2018-10-25 NOTE — Telephone Encounter (Signed)
Patient called & l/m stating she had a colonoscopy scheduled for 10-27-18 & had begun her period. She normally has a heavy cycle & wanted to know if this was ok or if she needed to r/s. I see were the procedure has been cancelled.

## 2018-10-25 NOTE — Progress Notes (Signed)
Office: [phone]  /  Fax: [phone]    Date: October 26, 2018   Appointment Start Time: 4:34pm Duration: 22 minutes Provider: Glennie Isle, Psy.D. Type of Session: Individual Therapy  Location of Patient: Home Location of Provider: [facility] Type of Contact: Telepsychological Visit via [employer] WebEx   Session Content: Kathelyn is a 43 y.o. female presenting via [place] for a follow-up appointment to address the previously established treatment goal of decreasing emotional eating. Today's appointment was a telepsychological visit, as this provider's clinic is seeing a limited number of patients for in-person visits due to COVID-19. Therapeutic services will resume to in-person appointments once deemed appropriate. Verlon expressed understanding regarding the rationale for telepsychological services, and provided verbal consent for today's appointment. Prior to proceeding with today's appointment, Darcy's physical location at the time of this appointment was obtained. Ahmaya reported she was at home and provided the address. In the event of technical difficulties, Lumina shared a phone number she could be reached at. Raelyn and this provider participated in today's telepsychological service. Also, Zahirah denied anyone else being present in the room or on the WebEx appointment.  This provider conducted a brief check-in and verbally administered the PHQ-9 and GAD-7. Jansen stated she was tested for COVID-19 and she noted the test was negative. Tomorrow, she shared she is scheduled for a colonoscopy. Regarding eating, Alexismarie acknowledged periods of emotional eating, but discussed she is doing better overall. Notably, she acknowledged eating due to anxiety when waiting for her COVID-19 results. Psychoeducation regarding triggers for emotional eating was provided. Qianna was provided a handout, and encouraged to utilize the handout between now and the next appointment to  increase awareness of triggers and frequency. Selia agreed. This provider also discussed behavioral strategies for specific triggers, such as placing the utensil down when conversing to avoid mindless eating. Charda provided verbal consent during today's appointment for this provider to send the handout via e-mail. Based on her current progress per self-report, frequency of appointments will be reduced. Solae was receptive to today's session as evidenced by openness to sharing, responsiveness to feedback, and willingness to identify triggers for emotional eating.  Mental Status Examination:  Appearance: neat Behavior: cooperative Mood: euthymic Affect: mood congruent Speech: normal in rate, volume, and tone Eye Contact: appropriate Psychomotor Activity: appropriate Thought Process: linear, logical, and goal directed  Content/Perceptual Disturbances: denies suicidal and homicidal ideation, plan, and intent and no hallucinations, delusions, bizarre thinking or behavior reported or observed Orientation: time, person, place and purpose of appointment Cognition/Sensorium: memory, attention, language, and fund of knowledge intact  Insight: good Judgment: good  Structured Assessment Results: The Patient Health Questionnaire-9 (PHQ-9) is a self-report measure that assesses symptoms and severity of depression over the course of the last two weeks. Tressie obtained a score of 0. Little interest or pleasure in doing things 0  Feeling down, depressed, or hopeless 0  Trouble falling or staying asleep, or sleeping too much 0  Feeling tired or having little energy 0  Poor appetite or overeating 0  Feeling bad about yourself --- or that you are a failure or have let yourself or your family down 0  Trouble concentrating on things, such as reading the newspaper or watching television 0  Moving or speaking so slowly that other people could have noticed? Or the opposite --- being so fidgety or restless that  you have been moving around a lot more than usual 0  Thoughts that you would be better off  dead or hurting yourself in some way 0  PHQ-9 Score 0    The Generalized Anxiety Disorder-7 (GAD-7) is a brief self-report measure that assesses symptoms of anxiety over the course of the last two weeks. Topaz obtained a score of 0. Feeling nervous, anxious, on edge 0  Not being able to stop or control worrying 0  Worrying too much about different things 0  Trouble relaxing 0  Being so restless that it's hard to sit still 0  Becoming easily annoyed or irritable 0  Feeling afraid as if something awful might happen 0  GAD-7 Score 0   Interventions:  Conducted a brief chart review Verbal administration of PHQ-9 and GAD-7 for symptom monitoring Provided empathic reflections and validation Reviewed content from the previous session Psychoeducation provided regarding triggers for emotional eating Focused on rapport building Employed supportive psychotherapy interventions to facilitate reduced distress, and to improve coping skills with identified stressors  DSM-5 Diagnosis: 311 (F32.8) Other Specified Depressive Disorder, Emotional Eating Behaviors  Treatment Goal & Progress: During the initial appointment with this provider, the following treatment goal was established: decrease emotional eating. Progress is limited, as Lanell has just begun treatment with this provider; however, she is receptive to the interaction and interventions and rapport is being established.    Plan: Winifred continues to appear able and willing to participate as evidenced by engagement in reciprocal conversation, and asking questions for clarification as appropriate. The next appointment will be scheduled in three weeks, which will be via [employer]. Once this provider's office resumes in-person appointments and it is deemed appropriate, Takela will be notified. The next session will focus on reviewing triggers for emotional  eating and the introduction of mindfulness.

## 2018-10-25 NOTE — Telephone Encounter (Signed)
Patient has been informed that she may still have her colonoscopy as scheduled for 10/27/18 with Dr. Marius Ditch at [facility].  She has been placed back on the schedule with Dr. Marius Ditch.  Thanks [employer]

## 2018-10-26 ENCOUNTER — Ambulatory Visit (INDEPENDENT_AMBULATORY_CARE_PROVIDER_SITE_OTHER): Payer: 59 | Admitting: Family Medicine

## 2018-10-26 ENCOUNTER — Other Ambulatory Visit: Payer: Self-pay

## 2018-10-26 ENCOUNTER — Encounter: Payer: Self-pay | Admitting: *Deleted

## 2018-10-26 ENCOUNTER — Ambulatory Visit (INDEPENDENT_AMBULATORY_CARE_PROVIDER_SITE_OTHER): Payer: 59 | Admitting: Psychology

## 2018-10-26 ENCOUNTER — Encounter (INDEPENDENT_AMBULATORY_CARE_PROVIDER_SITE_OTHER): Payer: Self-pay | Admitting: Family Medicine

## 2018-10-26 VITALS — BP 117/75 | HR 79 | Temp 98.3°F | Ht 68.0 in | Wt 219.0 lb

## 2018-10-26 DIAGNOSIS — Z9189 Other specified personal risk factors, not elsewhere classified: Secondary | ICD-10-CM

## 2018-10-26 DIAGNOSIS — F3289 Other specified depressive episodes: Secondary | ICD-10-CM

## 2018-10-26 DIAGNOSIS — E559 Vitamin D deficiency, unspecified: Secondary | ICD-10-CM | POA: Diagnosis not present

## 2018-10-26 DIAGNOSIS — E669 Obesity, unspecified: Secondary | ICD-10-CM

## 2018-10-26 DIAGNOSIS — Z6833 Body mass index (BMI) 33.0-33.9, adult: Secondary | ICD-10-CM

## 2018-10-26 DIAGNOSIS — R7303 Prediabetes: Secondary | ICD-10-CM | POA: Diagnosis not present

## 2018-10-26 MED ORDER — METFORMIN HCL 500 MG PO TABS: 500.0000 mg | ORAL_TABLET | Freq: Every day | ORAL | 0 refills | Status: DC

## 2018-10-26 MED ORDER — VITAMIN D (ERGOCALCIFEROL) 1.25 MG (50000 UNIT) PO CAPS: 50000.0000 [IU] | ORAL_CAPSULE | ORAL | 0 refills | Status: DC

## 2018-10-26 MED FILL — metFORMIN HCL 500 MG TABS: 500 | 30 days supply | Qty: 30 | Fill #0

## 2018-10-26 MED FILL — VIT D2 1.25 MG (50,000 UNIT: 1.25 MG | 28 days supply | Qty: 4 | Fill #0

## 2018-10-26 NOTE — Progress Notes (Signed)
Office: [phone]  /  Fax: [phone]   HPI:   Chief Complaint: OBESITY Angela [name] is here to discuss her progress with her obesity treatment plan. She is on the Category 3 plan and is following her eating plan approximately 85-90 % of the time. She states she is riding stationary bike for 15 minutes 2 times per week. Tomma found dinner portion of meat to a lot, but she is capable of doing 8 oz consistently. She did do some anxious emotional eating while waiting for COVID-19 test results. She is doing snacks more frequent at home, but not craving much.  Her weight is 219 lb (99.3 kg) today and has had a weight loss of 1 pound over a period of 3 weeks since her last visit. She has lost 1 lbs since starting treatment with [place].  Vitamin D Deficiency Pharrah has a diagnosis of vitamin D deficiency. She is not currently taking OTC Vit D. She notes fatigue and denies nausea, vomiting or muscle weakness.  Non Alcoholic Fatty Liver Disease Marshell's ALT is slightly elevated at 46. She sees GI. She denies abdominal pain or jaundice. She denies excessive alcohol intake.  Pre-Diabetes Daveah has a diagnosis of pre-diabetes based on her elevated Hgb A1c and was informed this puts her at greater risk of developing diabetes. Last Hgb A1c was of 5.7 and insulin of 61.1. She is not on any medications and she voices she is not having side effects. She continues to work on diet and exercise to decrease risk of diabetes. She denies nausea or hypoglycemia.  At risk for diabetes [place] is at higher than average risk for developing diabetes due to her obesity and pre-diabetes. She currently denies polyuria or polydipsia.  ASSESSMENT AND PLAN:  Prediabetes  Vitamin D deficiency  At risk for diabetes mellitus  Class 1 obesity with serious comorbidity and body mass index (BMI) of 33.0 to 33.9 in adult, unspecified obesity type  PLAN:  Vitamin D Deficiency Navayah was informed that low vitamin D levels  contributes to fatigue and are associated with obesity, breast, and colon cancer. Venicia agrees to start prescription Vit D 50,000 IU every week #4 with no refills. She will follow up for routine testing of vitamin D, at least 2-3 times per year. She was informed of the risk of over-replacement of vitamin D and agrees to not increase her dose unless she discusses this with [place] first. Jil agrees to follow up with our clinic in 2 weeks.  Non Alcoholic Fatty Liver Disease We discussed the likely diagnosis of non alcoholic fatty liver disease today and how this condition is obesity related. Joy was educated on her risk of developing NASH or even liver failure and th only proven treatment for NAFLD was weight loss. Consetta agreed to continue with her weight loss efforts with healthier diet and exercise as an essential part of her treatment plan. Nakota is to follow up with GI, and she agrees to follow up with our clinic in 2 weeks.  Pre-Diabetes Tiwanna will continue to work on weight loss, exercise, and decreasing simple carbohydrates in her diet to help decrease the risk of diabetes. We dicussed metformin including benefits and risks. She was informed that eating too many simple carbohydrates or too many calories at one sitting increases the likelihood of GI side effects. Velencia agrees to start metformin 500 mg PO q AM #30 with no refills. Nailea agrees to follow up with our clinic in 2 weeks as directed to monitor her  progress.  Diabetes risk counseling Marlis was given extended (30 minutes) diabetes prevention counseling today. She is 43 y.o. female and has risk factors for diabetes including obesity and pre-diabetes. We discussed intensive lifestyle modifications today with an emphasis on weight loss as well as increasing exercise and decreasing simple carbohydrates in her diet.  Obesity Ruvi is currently in the action stage of change. As such, her goal is to continue with weight loss efforts She  has agreed to follow the Category 3 plan Addilynn has been instructed to work up to a goal of 150 minutes of combined cardio and strengthening exercise per week for weight loss and overall health benefits. We discussed the following Behavioral Modification Strategies today: increasing lean protein intake, increasing vegetables and work on meal planning and easy cooking plans, keeping healthy foods in the home, better snacking choices, and planning for success   Audelia has agreed to follow up with our clinic in 2 weeks. She was informed of the importance of frequent follow up visits to maximize her success with intensive lifestyle modifications for her multiple health conditions.  ALLERGIES: Allergies  Allergen Reactions  . Quinoa-Kale-Hemp [Alimentum]     MEDICATIONS: Current Outpatient Medications on File Prior to Visit  Medication Sig Dispense Refill  . brimonidine-timolol (COMBIGAN) 0.2-0.5 % ophthalmic solution Place 1 drop into the right eye every 12 (twelve) hours.     . cetirizine (ZYRTEC) 10 MG tablet Take 10 mg by mouth daily as needed for allergies.    . fluticasone (FLONASE) 50 MCG/ACT nasal spray Place 1 spray into both nostrils 2 (two) times daily as needed for allergies.     Marland Kitchen ibuprofen (ADVIL,MOTRIN) 600 MG tablet Take 1 tablet (600 mg total) by mouth every 6 (six) hours as needed for mild pain, moderate pain or cramping. 30 tablet 0  . latanoprost (XALATAN) 0.005 % ophthalmic solution Place 1 drop into the right eye at bedtime.     . dorzolamide (TRUSOPT) 2 % ophthalmic solution Place 1 drop into the right eye 3 (three) times daily.    . ferrous sulfate 325 (65 FE) MG tablet Take 1 tablet (325 mg total) by mouth daily. (Patient taking differently: Take 325 mg by mouth every other day. ) 90 tablet 0   No current facility-administered medications on file prior to visit.     PAST MEDICAL HISTORY: Past Medical History:  Diagnosis Date  . Allergy Enviromental  . Anemia  may/2017  . Asthma    as a child  . Back pain gerd  . Constipation   . Glaucoma   . Heart murmur childhood  . IBS (irritable bowel syndrome)   . Joint pain   . Menorrhagia   . Palpitations   . Plantar fasciitis   . Prediabetes   . Swelling    feet, legs    PAST SURGICAL HISTORY: Past Surgical History:  Procedure Laterality Date  . CHOLECYSTECTOMY  2005  . DILATATION & CURETTAGE/HYSTEROSCOPY WITH MYOSURE N/A 06/23/2018   Procedure: DILATATION & CURETTAGE/HYSTEROSCOPY WITH MYOSURE;  Surgeon: Will Bonnet, MD;  Location: [facility] ORS;  Service: Gynecology;  Laterality: N/A;  . DILATION AND CURETTAGE OF UTERUS    . ERCP     3 times  . EYE SURGERY    . MENISCUS REPAIR Right    x 2    SOCIAL HISTORY: Social History   Tobacco Use  . Smoking status: Never Smoker  . Smokeless tobacco: Never Used  Substance Use Topics  . Alcohol use:  Yes    Alcohol/week: 2.0 standard drinks    Types: 2 Glasses of wine per week    Comment: occasional  . Drug use: No    FAMILY HISTORY: Family History  Problem Relation Age of Onset  . Colon cancer Mother 63  . Diabetes Mother   . Hypertension Mother   . Arthritis Mother   . Transient ischemic attack Mother   . Liver disease Mother   . Obesity Mother   . Diabetes Father   . Heart disease Father   . Esophageal cancer Maternal Grandmother   . Cancer Maternal Grandmother   . Diabetes Maternal Grandmother   . Alcohol abuse Maternal Grandfather   . Breast cancer Maternal Aunt        mat great aunt    ROS: Review of Systems  Constitutional: Positive for malaise/fatigue. Negative for weight loss.  Eyes:       Negative jaundice  Gastrointestinal: Negative for abdominal pain, nausea and vomiting.  Genitourinary: Negative for frequency.  Musculoskeletal:       Negative muscle weakness  Endo/Heme/Allergies: Negative for polydipsia.       Negative hypoglycemia    PHYSICAL EXAM: Blood pressure 117/75, pulse 79, temperature 98.3 F  (36.8 C), temperature source Oral, height 5\' 8"  (1.727 m), weight 219 lb (99.3 kg), last menstrual period 10/25/2018, SpO2 98 %. Body mass index is 33.3 kg/m. Physical Exam Vitals signs reviewed.  Constitutional:      Appearance: Normal appearance. She is obese.  Cardiovascular:     Rate and Rhythm: Normal rate.     Pulses: Normal pulses.  Pulmonary:     Effort: Pulmonary effort is normal.     Breath sounds: Normal breath sounds.  Musculoskeletal: Normal range of motion.  Skin:    General: Skin is warm and dry.  Neurological:     Mental Status: She is alert and oriented to person, place, and time.  Psychiatric:        Mood and Affect: Mood normal.        Behavior: Behavior normal.     RECENT LABS AND TESTS: BMET    Component Value Date/Time   NA 138 10/05/2018 1005   K 4.7 10/05/2018 1005   CL 104 10/05/2018 1005   CO2 23 10/05/2018 1005   GLUCOSE 99 10/05/2018 1005   GLUCOSE 105 (H) 06/13/2018 1006   BUN 13 10/05/2018 1005   CREATININE 0.56 (L) 10/05/2018 1005   CALCIUM 9.4 10/05/2018 1005   GFRNONAA 115 10/05/2018 1005   GFRAA 132 10/05/2018 1005   Lab Results  Component Value Date   HGBA1C 5.7 (H) 10/05/2018   HGBA1C 5.9 06/22/2018   HGBA1C 5.8 06/21/2017   HGBA1C 5.8 08/31/2016   Lab Results  Component Value Date   INSULIN 61.1 (H) 10/05/2018   CBC    Component Value Date/Time   WBC 5.8 10/05/2018 1005   WBC 5.4 09/22/2017 0957   RBC 4.53 10/05/2018 1005   RBC 4.32 09/22/2017 0957   HGB 11.5 10/05/2018 1005   HCT 37.0 10/05/2018 1005   PLT 182 04/29/2018 1007   MCV 82 10/05/2018 1005   MCH 25.4 (L) 10/05/2018 1005   MCHC 31.1 (L) 10/05/2018 1005   MCHC 34.0 09/22/2017 0957   RDW 15.8 (H) 10/05/2018 1005   LYMPHSABS 1.6 10/05/2018 1005   EOSABS 0.2 10/05/2018 1005   BASOSABS 0.0 10/05/2018 1005   Iron/TIBC/Ferritin/ %Sat    Component Value Date/Time   IRON 40 10/05/2018 1005  TIBC 403 10/05/2018 1005   FERRITIN 20 10/05/2018 1005    IRONPCTSAT 10 (L) 10/05/2018 1005   IRONPCTSAT 7 (L) 09/08/2016 1109   Lipid Panel     Component Value Date/Time   CHOL 156 10/05/2018 1005   TRIG 106 10/05/2018 1005   HDL 61 10/05/2018 1005   CHOLHDL 3 06/22/2018 1113   VLDL 20.6 06/22/2018 1113   LDLCALC 74 10/05/2018 1005   Hepatic Function Panel     Component Value Date/Time   PROT 7.3 10/05/2018 1005   ALBUMIN 4.5 10/05/2018 1005   AST 36 10/05/2018 1005   ALT 46 (H) 10/05/2018 1005   ALKPHOS 92 10/05/2018 1005   BILITOT 0.4 10/05/2018 1005   BILIDIR 0.1 06/22/2018 1113      Component Value Date/Time   TSH 2.020 10/05/2018 1005   TSH 1.660 04/29/2018 1007      OBESITY BEHAVIORAL INTERVENTION VISIT  Today's visit was # 2   Starting weight: 220 lbs Starting date: 10/05/2018 Today's weight : 219 lbs Today's date: 10/26/2018 Total lbs lost to date: 1    ASK: We discussed the diagnosis of obesity with Corisa Rodriguez-Guzman today and Negin agreed to give [place] permission to discuss obesity behavioral modification therapy today.  ASSESS: Saree has the diagnosis of obesity and her BMI today is 33.31 Lilias is in the action stage of change   ADVISE: Sua was educated on the multiple health risks of obesity as well as the benefit of weight loss to improve her health. She was advised of the need for long term treatment and the importance of lifestyle modifications to improve her current health and to decrease her risk of future health problems.  AGREE: Multiple dietary modification options and treatment options were discussed and  Laquida agreed to follow the recommendations documented in the above note.  ARRANGE: Angelisse was educated on the importance of frequent visits to treat obesity as outlined per CMS and USPSTF guidelines and agreed to schedule her next follow up appointment today.  I, Trixie Dredge, am acting as transcriptionist for Ilene Qua, MD  I have reviewed the above documentation for  accuracy and completeness, and I agree with the above. - Ilene Qua, MD

## 2018-10-27 ENCOUNTER — Ambulatory Visit: Admission: RE | Admit: 2018-10-27 | Payer: 59 | Source: Home / Self Care | Admitting: Gastroenterology

## 2018-10-27 ENCOUNTER — Encounter: Admission: RE | Payer: Self-pay | Source: Home / Self Care

## 2018-10-27 ENCOUNTER — Ambulatory Visit
Admission: RE | Admit: 2018-10-27 | Discharge: 2018-10-27 | Disposition: A | Discharge status: Home / Self Care | Payer: 59 | Attending: Gastroenterology | Admitting: Gastroenterology

## 2018-10-27 ENCOUNTER — Ambulatory Visit: Payer: 59 | Admitting: Registered Nurse

## 2018-10-27 ENCOUNTER — Encounter: Payer: Self-pay | Admitting: *Deleted

## 2018-10-27 ENCOUNTER — Other Ambulatory Visit: Payer: Self-pay

## 2018-10-27 ENCOUNTER — Encounter
Admission: RE | Disposition: A | Discharge status: Home / Self Care | Payer: Self-pay | Source: Home / Self Care | Attending: Gastroenterology

## 2018-10-27 DIAGNOSIS — H409 Unspecified glaucoma: Secondary | ICD-10-CM | POA: Insufficient documentation

## 2018-10-27 DIAGNOSIS — K589 Irritable bowel syndrome without diarrhea: Secondary | ICD-10-CM | POA: Insufficient documentation

## 2018-10-27 DIAGNOSIS — D122 Benign neoplasm of ascending colon: Secondary | ICD-10-CM | POA: Diagnosis not present

## 2018-10-27 DIAGNOSIS — D124 Benign neoplasm of descending colon: Secondary | ICD-10-CM | POA: Insufficient documentation

## 2018-10-27 DIAGNOSIS — R7303 Prediabetes: Secondary | ICD-10-CM | POA: Insufficient documentation

## 2018-10-27 DIAGNOSIS — D509 Iron deficiency anemia, unspecified: Secondary | ICD-10-CM | POA: Diagnosis not present

## 2018-10-27 DIAGNOSIS — D120 Benign neoplasm of cecum: Secondary | ICD-10-CM | POA: Insufficient documentation

## 2018-10-27 DIAGNOSIS — Z7689 Persons encountering health services in other specified circumstances: Secondary | ICD-10-CM | POA: Diagnosis not present

## 2018-10-27 DIAGNOSIS — Z1211 Encounter for screening for malignant neoplasm of colon: Secondary | ICD-10-CM | POA: Diagnosis not present

## 2018-10-27 DIAGNOSIS — Z7984 Long term (current) use of oral hypoglycemic drugs: Secondary | ICD-10-CM | POA: Diagnosis not present

## 2018-10-27 DIAGNOSIS — K635 Polyp of colon: Secondary | ICD-10-CM | POA: Diagnosis not present

## 2018-10-27 DIAGNOSIS — K317 Polyp of stomach and duodenum: Secondary | ICD-10-CM | POA: Diagnosis not present

## 2018-10-27 DIAGNOSIS — Z800 Family history of malignant neoplasm of digestive organs: Secondary | ICD-10-CM | POA: Diagnosis not present

## 2018-10-27 DIAGNOSIS — Z8371 Family history of colonic polyps: Secondary | ICD-10-CM | POA: Diagnosis not present

## 2018-10-27 DIAGNOSIS — Z79899 Other long term (current) drug therapy: Secondary | ICD-10-CM | POA: Insufficient documentation

## 2018-10-27 DIAGNOSIS — Z8709 Personal history of other diseases of the respiratory system: Secondary | ICD-10-CM | POA: Insufficient documentation

## 2018-10-27 HISTORY — PX: COLONOSCOPY WITH PROPOFOL: SHX5780

## 2018-10-27 HISTORY — PX: ESOPHAGOGASTRODUODENOSCOPY (EGD) WITH PROPOFOL: SHX5813

## 2018-10-27 SURGERY — COLONOSCOPY WITH PROPOFOL
Anesthesia: General

## 2018-10-27 MED ORDER — PROPOFOL 500 MG/50ML IV EMUL
INTRAVENOUS | Status: DC | PRN
  Administered 2018-10-27: 140 ug/kg/min via INTRAVENOUS

## 2018-10-27 MED ORDER — LIDOCAINE HCL (CARDIAC) PF 100 MG/5ML IV SOSY
PREFILLED_SYRINGE | INTRAVENOUS | Status: DC | PRN
  Administered 2018-10-27: 60 mg via INTRAVENOUS

## 2018-10-27 MED ORDER — FENTANYL CITRATE (PF) 100 MCG/2ML IJ SOLN
INTRAMUSCULAR | Status: AC
  Filled 2018-10-27: qty 2

## 2018-10-27 MED ORDER — GLYCOPYRROLATE 0.2 MG/ML IJ SOLN
INTRAMUSCULAR | Status: DC | PRN
  Administered 2018-10-27: 0.2 mg via INTRAVENOUS

## 2018-10-27 MED ORDER — SODIUM CHLORIDE 0.9 % IV SOLN: INTRAVENOUS | Status: DC

## 2018-10-27 MED ORDER — SODIUM CHLORIDE 0.9 % IV SOLN
INTRAVENOUS | Status: DC
  Administered 2018-10-27: 10:00:00 via INTRAVENOUS

## 2018-10-27 MED ORDER — PROPOFOL 10 MG/ML IV BOLUS
INTRAVENOUS | Status: DC | PRN
  Administered 2018-10-27: 70 mg via INTRAVENOUS

## 2018-10-27 MED ORDER — FENTANYL CITRATE (PF) 250 MCG/5ML IJ SOLN
INTRAMUSCULAR | Status: DC | PRN
  Administered 2018-10-27 (×3): 25 ug via INTRAVENOUS

## 2018-10-27 NOTE — Op Note (Signed)
[facility] Gastroenterology Patient Name: Angela [name] Procedure Date: 10/27/2018 9:52 AM MRN: [id number] Account #: [id number] Date of Birth: 1975-09-16 Admit Type: Outpatient Age: 43 Room: [facility] ENDO ROOM 2 Gender: Female Note Status: Finalized Procedure:            Upper GI endoscopy Indications:          Iron deficiency anemia Providers:            Lin Landsman MD, MD Medicines:            Monitored Anesthesia Care Complications:        No immediate complications. Estimated blood loss: None. Procedure:            Pre-Anesthesia Assessment:                       - Prior to the procedure, a History and Physical was                        performed, and patient medications and allergies were                        reviewed. The patient is competent. The risks and                        benefits of the procedure and the sedation options and                        risks were discussed with the patient. All questions                        were answered and informed consent was obtained.                        Patient identification and proposed procedure were                        verified by the physician, the nurse, the                        anesthesiologist, the anesthetist and the technician in                        the pre-procedure area in the procedure room in the                        endoscopy suite. Mental Status Examination: alert and                        oriented. Airway Examination: normal oropharyngeal                        airway and neck mobility. Respiratory Examination:                        clear to auscultation. CV Examination: normal.                        Prophylactic Antibiotics: The patient does not require  prophylactic antibiotics. Prior Anticoagulants: The                        patient has taken no previous anticoagulant or                        antiplatelet agents. ASA Grade Assessment: II  - A                        patient with mild systemic disease. After reviewing the                        risks and benefits, the patient was deemed in                        satisfactory condition to undergo the procedure. The                        anesthesia plan was to use monitored anesthesia care                        (MAC). Immediately prior to administration of                        medications, the patient was re-assessed for adequacy                        to receive sedatives. The heart rate, respiratory rate,                        oxygen saturations, blood pressure, adequacy of                        pulmonary ventilation, and response to care were                        monitored throughout the procedure. The physical status                        of the patient was re-assessed after the procedure.                       The Endoscope was introduced through the mouth, and                        advanced to the second part of duodenum. The upper GI                        endoscopy was accomplished without difficulty. The                        patient tolerated the procedure well. Findings:      The duodenal bulb and second portion of the duodenum were normal.      A few small sessile polyps with no bleeding and no stigmata of recent       bleeding were found in the gastric fundus, likely fundic gland polyps.       Two polyps were removed with a hot snare. Resection and retrieval were       complete.  The gastroesophageal junction and examined esophagus were normal. Impression:           - Normal duodenal bulb and second portion of the                        duodenum.                       - A few gastric polyps. Resected and retrieved.                       - Normal gastroesophageal junction and esophagus. Recommendation:       - Await pathology results.                       - Proceed with colonoscopy as scheduled                       See colonoscopy  report Procedure Code(s):    --- Professional ---                       [phone], Esophagogastroduodenoscopy, flexible, transoral;                        with removal of tumor(s), polyp(s), or other lesion(s)                        by snare technique Diagnosis Code(s):    --- Professional ---                       K31.7, Polyp of stomach and duodenum                       D50.9, Iron deficiency anemia, unspecified CPT copyright 2019 American Medical Association. All rights reserved. The codes documented in this report are preliminary and upon coder review may  be revised to meet current compliance requirements. Dr. Ulyess Mort Lin Landsman MD, MD 10/27/2018 10:26:54 AM This report has been signed electronically. Number of Addenda: 0 Note Initiated On: 10/27/2018 9:52 AM Estimated Blood Loss: Estimated blood loss: none.      [facility]

## 2018-10-27 NOTE — Transfer of Care (Signed)
Immediate Anesthesia Transfer of Care Note  Patient: Angela [name]  Procedure(s) Performed: COLONOSCOPY WITH PROPOFOL (N/A ) ESOPHAGOGASTRODUODENOSCOPY (EGD) WITH PROPOFOL  Patient Location: PACU  Anesthesia Type:General  Level of Consciousness: sedated  Airway & Oxygen Therapy: Patient Spontanous Breathing and Patient connected to nasal cannula oxygen  Post-op Assessment: Report given to RN and Post -op Vital signs reviewed and stable  Post vital signs: Reviewed and stable  Last Vitals:  Vitals Value Taken Time  BP 116/58 10/27/18 1054  Temp 36.4 C 10/27/18 1054  Pulse 79 10/27/18 1054  Resp 24 10/27/18 1054  SpO2 100 % 10/27/18 1054  Vitals shown include unvalidated device data.  Last Pain:  Vitals:   10/27/18 1053  TempSrc: Tympanic  PainSc:          Complications: No apparent anesthesia complications

## 2018-10-27 NOTE — Anesthesia Post-op Follow-up Note (Signed)
Anesthesia QCDR form completed.        

## 2018-10-27 NOTE — Op Note (Signed)
[facility] Gastroenterology Patient Name: Angela [name] Procedure Date: 10/27/2018 10:29 AM MRN: [id number] Account #: [id number] Date of Birth: October 04, 1975 Admit Type: Outpatient Age: 43 Room: [facility] ENDO ROOM 2 Gender: Female Note Status: Finalized Procedure:            Colonoscopy Indications:          Screening in patient at increased risk: Colorectal                        cancer in mother before age 44 Providers:            Sherri Sear MD, MD Medicines:            Monitored Anesthesia Care Complications:        No immediate complications. Estimated blood loss: None. Procedure:            Pre-Anesthesia Assessment:                       - Prior to the procedure, a History and Physical was                        performed, and patient medications and allergies were                        reviewed. The patient is competent. The risks and                        benefits of the procedure and the sedation options and                        risks were discussed with the patient. All questions                        were answered and informed consent was obtained.                        Patient identification and proposed procedure were                        verified by the physician, the nurse, the                        anesthesiologist, the anesthetist and the technician in                        the pre-procedure area in the procedure room in the                        endoscopy suite. Mental Status Examination: alert and                        oriented. Airway Examination: normal oropharyngeal                        airway and neck mobility. Respiratory Examination:                        clear to auscultation. CV Examination: normal.  Prophylactic Antibiotics: The patient does not require                        prophylactic antibiotics. Prior Anticoagulants: The                        patient has taken no previous anticoagulant or                         antiplatelet agents. ASA Grade Assessment: II - A                        patient with mild systemic disease. After reviewing the                        risks and benefits, the patient was deemed in                        satisfactory condition to undergo the procedure. The                        anesthesia plan was to use monitored anesthesia care                        (MAC). Immediately prior to administration of                        medications, the patient was re-assessed for adequacy                        to receive sedatives. The heart rate, respiratory rate,                        oxygen saturations, blood pressure, adequacy of                        pulmonary ventilation, and response to care were                        monitored throughout the procedure. The physical status                        of the patient was re-assessed after the procedure.                       After obtaining informed consent, the colonoscope was                        passed under direct vision. Throughout the procedure,                        the patient's blood pressure, pulse, and oxygen                        saturations were monitored continuously. The                        Colonoscope was introduced through the anus and  advanced to the the terminal ileum, with identification                        of the appendiceal orifice and IC valve. The                        colonoscopy was performed without difficulty. The                        patient tolerated the procedure well. The quality of                        the bowel preparation was good. Findings:      The perianal and digital rectal examinations were normal. Pertinent       negatives include normal sphincter tone and no palpable rectal lesions.      A 5 mm polyp was found in the cecum. The polyp was flat. Preparations       were made for mucosal resection. Saline was injected to raise the        lesion. Snare mucosal resection was performed. Resection and retrieval       were complete. To prevent bleeding after mucosal resection, one       hemostatic clip was successfully placed. There was no bleeding during,       or at the end, of the procedure.      A 8 mm polyp was found in the ascending colon. The polyp was sessile.       The polyp was removed with a hot snare. Resection and retrieval were       complete. To prevent bleeding after the polypectomy, one hemostatic clip       was successfully placed. There was no bleeding during, or at the end, of       the procedure.      A 5 mm polyp was found in the descending colon. The polyp was sessile.       The polyp was removed with a cold snare. Resection and retrieval were       complete.      The retroflexed view of the distal rectum and anal verge was normal and       showed no anal or rectal abnormalities.      The exam was otherwise without abnormality. Impression:           - One 5 mm polyp in the cecum, removed with mucosal                        resection. Resected and retrieved. Clip was placed.                       - One 8 mm polyp in the ascending colon, removed with a                        hot snare. Resected and retrieved. Clip was placed.                       - One 5 mm polyp in the descending colon, removed with                        a cold snare. Resected and retrieved.                       -  The distal rectum and anal verge are normal on                        retroflexion view.                       - The examination was otherwise normal.                       - Mucosal resection was performed. Resection and                        retrieval were complete. Recommendation:       - Discharge patient to home (with escort).                       - Resume previous diet today.                       - Continue present medications.                       - Await pathology results.                       - Repeat  colonoscopy in 3 years for surveillance of                        multiple polyps. Procedure Code(s):    --- Professional ---                       [phone], Colonoscopy, flexible; with endoscopic mucosal                        resection                       [id number], 81, Colonoscopy, flexible; with removal of                        tumor(s), polyp(s), or other lesion(s) by snare                        technique Diagnosis Code(s):    --- Professional ---                       Z80.0, Family history of malignant neoplasm of                        digestive organs                       K63.5, Polyp of colon CPT copyright 2019 American [employer] Association. All rights reserved. The codes documented in this report are preliminary and upon coder review may  be revised to meet current compliance requirements. Dr. Ulyess Mort Lin Landsman MD, MD 10/27/2018 10:51:46 AM This report has been signed electronically. Number of Addenda: 0 Note Initiated On: 10/27/2018 10:29 AM Scope Withdrawal Time: 0 hours 14 minutes 7 seconds  Total Procedure Duration: 0 hours 17 minutes 24 seconds  Estimated Blood Loss: Estimated blood loss: none.      [facility]

## 2018-10-27 NOTE — Anesthesia Preprocedure Evaluation (Signed)
Anesthesia Evaluation  Patient identified by MRN, date of birth, ID band Patient awake    Reviewed: Allergy & Precautions, NPO status , Patient's Chart, lab work & pertinent test results  History of Anesthesia Complications Negative for: history of anesthetic complications  Airway Mallampati: III  TM Distance: >3 FB Neck ROM: Full    Dental  (+) Implants, Dental Advidsory Given, Caps   Pulmonary neg pulmonary ROS, neg sleep apnea, neg COPD,    breath sounds clear to auscultation- rhonchi (-) wheezing      Cardiovascular Exercise Tolerance: Good (-) hypertension(-) angina(-) CAD and (-) Past MI (-) dysrhythmias + Valvular Problems/Murmurs (as a child)  Rhythm:Regular Rate:Normal - Systolic murmurs and - Diastolic murmurs    Neuro/Psych negative neurological ROS  negative psych ROS   GI/Hepatic negative GI ROS, Neg liver ROS,   Endo/Other  negative endocrine ROSneg diabetes  Renal/GU negative Renal ROS     Musculoskeletal negative musculoskeletal ROS (+)   Abdominal (+) + obese,   Peds  Hematology negative hematology ROS (+) Blood dyscrasia, anemia ,   Anesthesia Other Findings Past Medical History: Enviromental: Allergy may/2017: Anemia No date: Glaucoma childhood: Heart murmur No date: Prediabetes   Reproductive/Obstetrics                             Anesthesia Physical  Anesthesia Plan  ASA: II  Anesthesia Plan: General   Post-op Pain Management:    Induction: Intravenous  PONV Risk Score and Plan: 2 and Propofol infusion and TIVA  Airway Management Planned: Natural Airway and Nasal Cannula  Additional Equipment:   Intra-op Plan:   Post-operative Plan:   Informed Consent: I have reviewed the patients History and Physical, chart, labs and discussed the procedure including the risks, benefits and alternatives for the proposed anesthesia with the patient or authorized  representative who has indicated his/her understanding and acceptance.     Dental advisory given  Plan Discussed with: CRNA and Anesthesiologist  Anesthesia Plan Comments:         Anesthesia Quick Evaluation

## 2018-10-27 NOTE — H&P (Signed)
Angela Darby, MD [address]  [place]  [place], [place] [address]  Main: [phone]  Fax: [phone] Pager: [phone]  Primary Care Physician:  Pleas Koch, NP Primary Gastroenterologist:  Dr. Cephas [name]  Pre-Procedure History & Physical: HPI:  Angela [name] is a 43 y.o. female is here for an endoscopy and colonoscopy.   Past Medical History:  Diagnosis Date   Allergy Enviromental   Anemia may/2017   Asthma    as a child   Back pain gerd   Constipation    Glaucoma    Heart murmur childhood   IBS (irritable bowel syndrome)    Joint pain    Menorrhagia    Palpitations    Plantar fasciitis    Prediabetes    Swelling    feet, legs    Past Surgical History:  Procedure Laterality Date   CHOLECYSTECTOMY  2005   DILATATION & CURETTAGE/HYSTEROSCOPY WITH MYOSURE N/A 06/23/2018   Procedure: DILATATION & CURETTAGE/HYSTEROSCOPY WITH MYOSURE;  Surgeon: Will Bonnet, MD;  Location: [facility] ORS;  Service: Gynecology;  Laterality: N/A;   DILATION AND CURETTAGE OF UTERUS     ERCP     3 times   EYE SURGERY     MENISCUS REPAIR Right    x 2    Prior to Admission medications   Medication Sig Start Date End Date Taking? Authorizing Provider  brimonidine-timolol (COMBIGAN) 0.2-0.5 % ophthalmic solution Place 1 drop into the right eye every 12 (twelve) hours.    Yes [provider]  cetirizine (ZYRTEC) 10 MG tablet Take 10 mg by mouth daily as needed for allergies.   Yes [provider]  dorzolamide (TRUSOPT) 2 % ophthalmic solution Place 1 drop into the right eye 3 (three) times daily.   Yes [provider]  fluticasone (FLONASE) 50 MCG/ACT nasal spray Place 1 spray into both nostrils 2 (two) times daily as needed for allergies.    Yes [provider]  ibuprofen (ADVIL,MOTRIN) 600 MG tablet Take 1 tablet (600 mg total) by mouth every 6 (six) hours as needed for mild pain, moderate pain or  cramping. 06/23/18  Yes Will Bonnet, MD  latanoprost (XALATAN) 0.005 % ophthalmic solution Place 1 drop into the right eye at bedtime.    Yes [provider]  metFORMIN (GLUCOPHAGE) 500 MG tablet Take 1 tablet (500 mg total) by mouth daily with breakfast. 10/26/18 11/25/18 Yes Eber Jones, MD  Vitamin D, Ergocalciferol, (DRISDOL) 1.25 MG (50000 UT) CAPS capsule Take 1 capsule (50,000 Units total) by mouth every 7 (seven) days. 10/26/18  Yes Eber Jones, MD  ferrous sulfate 325 (65 FE) MG tablet Take 1 tablet (325 mg total) by mouth daily. Patient taking differently: Take 325 mg by mouth every other day.  06/21/17 06/22/18  Pleas Koch, NP    Allergies as of 10/25/2018 - Review Complete 10/05/2018  Allergen Reaction Noted   Quinoa-kale-hemp [alimentum]  10/05/2018    Family History  Problem Relation Age of Onset   Colon cancer Mother 65   Diabetes Mother    Hypertension Mother    Arthritis Mother    Transient ischemic attack Mother    Liver disease Mother    Obesity Mother    Diabetes Father    Heart disease Father    Esophageal cancer Maternal Grandmother    Cancer Maternal Grandmother    Diabetes Maternal Grandmother    Alcohol abuse Maternal Grandfather    Breast cancer Maternal  Aunt        mat great aunt    Social History   Socioeconomic History   Marital status: Married    Spouse name: Financial planner   Number of children: 0   Years of education: Not on file   Highest education level: Not on file  Occupational History   Occupation: Agricultural consultant: [employer]   Financial resource strain: Not on file   Food insecurity    Worry: Not on file    Inability: Not on file   Transportation needs    Medical: Not on file    Non-medical: Not on file  Tobacco Use   Smoking status: Never Smoker   Smokeless tobacco: Never Used  Substance and Sexual Activity   Alcohol use: Yes     Alcohol/week: 2.0 standard drinks    Types: 2 Glasses of wine per week    Comment: occasional   Drug use: No   Sexual activity: Yes    Birth control/protection: None  Lifestyle   Physical activity    Days per week: 0 days    Minutes per session: Not on file   Stress: Not on file  Relationships   Social connections    Talks on phone: Not on file    Gets together: Not on file    Attends religious service: Not on file    Active member of club or organization: Not on file    Attends meetings of clubs or organizations: Not on file    Relationship status: Not on file   Intimate partner violence    Fear of current or ex partner: Not on file    Emotionally abused: Not on file    Physically abused: Not on file    Forced sexual activity: Not on file  Other Topics Concern   Not on file  Social History Narrative   Married.   No children.   Works in the [employer].   Enjoys watching movies, walking, reading.     Review of Systems: See HPI, otherwise negative ROS  Physical Exam: BP 130/76    Pulse 80    Temp (!) 96.5 F (35.8 C) (Tympanic)    Resp 16    Ht 5\' 8"  (1.727 m)    Wt 99.7 kg    LMP 10/25/2018    SpO2 100%    BMI 33.42 kg/m  General:   Alert,  pleasant and cooperative in NAD Head:  Normocephalic and atraumatic. Neck:  Supple; no masses or thyromegaly. Lungs:  Clear throughout to auscultation.    Heart:  Regular rate and rhythm. Abdomen:  Soft, nontender and nondistended. Normal bowel sounds, without guarding, and without rebound.   Neurologic:  Alert and  oriented x4;  grossly normal neurologically.  Impression/Plan: Angela [name] is here for an endoscopy and colonoscopy to be performed for fam his of colon cancer  Risks, benefits, limitations, and alternatives regarding  endoscopy and colonoscopy have been reviewed with the patient.  Questions have been answered.  All parties agreeable.   Sherri Sear, MD  10/27/2018, 9:58 AM

## 2018-10-27 NOTE — Anesthesia Postprocedure Evaluation (Signed)
Anesthesia Post Note  Patient: Artavia Rodriguez-Guzman  Procedure(s) Performed: COLONOSCOPY WITH PROPOFOL (N/A ) ESOPHAGOGASTRODUODENOSCOPY (EGD) WITH PROPOFOL  Patient location during evaluation: Endoscopy Anesthesia Type: General Level of consciousness: awake and alert Pain management: pain level controlled Vital Signs Assessment: post-procedure vital signs reviewed and stable Respiratory status: spontaneous breathing, nonlabored ventilation, respiratory function stable and patient connected to nasal cannula oxygen Cardiovascular status: blood pressure returned to baseline and stable Postop Assessment: no apparent nausea or vomiting Anesthetic complications: no     Last Vitals:  Vitals:   10/27/18 1110 10/27/18 1120  BP:  126/80  Pulse: 79 69  Resp: 17 (!) 28  Temp:    SpO2: 100% 100%    Last Pain:  Vitals:   10/27/18 1053  TempSrc: Tympanic  PainSc:                  Martha Clan

## 2018-10-28 LAB — SURGICAL PATHOLOGY

## 2018-11-01 MED FILL — COMBIGAN EYE DROPS: 0.2-0.5 | 30 days supply | Qty: 5 | Fill #6

## 2018-11-01 MED FILL — LATANOPROST 0.005% EYE DRP: 0.005 | 50 days supply | Qty: 3 | Fill #6

## 2018-11-07 ENCOUNTER — Encounter: Payer: Self-pay | Admitting: Gastroenterology

## 2018-11-08 ENCOUNTER — Encounter (INDEPENDENT_AMBULATORY_CARE_PROVIDER_SITE_OTHER): Payer: Self-pay | Admitting: Family Medicine

## 2018-11-08 ENCOUNTER — Other Ambulatory Visit: Payer: Self-pay

## 2018-11-08 ENCOUNTER — Ambulatory Visit (INDEPENDENT_AMBULATORY_CARE_PROVIDER_SITE_OTHER): Payer: 59 | Admitting: Family Medicine

## 2018-11-08 VITALS — BP 102/70 | HR 81 | Temp 98.1°F | Ht 68.0 in | Wt 217.0 lb

## 2018-11-08 DIAGNOSIS — E559 Vitamin D deficiency, unspecified: Secondary | ICD-10-CM

## 2018-11-08 DIAGNOSIS — Z6833 Body mass index (BMI) 33.0-33.9, adult: Secondary | ICD-10-CM | POA: Diagnosis not present

## 2018-11-08 DIAGNOSIS — R7303 Prediabetes: Secondary | ICD-10-CM | POA: Diagnosis not present

## 2018-11-08 DIAGNOSIS — E669 Obesity, unspecified: Secondary | ICD-10-CM | POA: Diagnosis not present

## 2018-11-08 NOTE — Progress Notes (Signed)
Office: [phone]  /  Fax: [phone]   HPI:   Chief Complaint: OBESITY Angela [name] is here to discuss her progress with her obesity treatment plan. She is on the Category 3 plan and is following her eating plan approximately 95 % of the time. She states she is riding the stationary bike for 20 minutes 3-4 times per week. Angela [name] had a colonoscopy and endoscopy a few days after her last visit; 1 polyps in stomach (noncancerous) and polyps in colon (precancer) and she needs to follow up in 3 years. She had cake for the day of and day after her birthday. She is looking to expand protein options to include salmon and seafood. She denies obstacles in the next few weeks.  Her weight is 217 lb (98.4 kg) today and has had a weight loss of 2 pounds over a period of 2 weeks since her last visit. She has lost 3 lbs since starting treatment with [place].  Pre-Diabetes Angela [name] has a diagnosis of pre-diabetes based on her elevated Hgb A1c and was informed this puts her at greater risk of developing diabetes. She denies GI side effects of metformin and notes sweet cravings in the evening. She continues to work on diet and exercise to decrease risk of diabetes. She denies hypoglycemia.  Vitamin D Deficiency Angela [name] has a diagnosis of vitamin D deficiency. She is currently taking Vit D supplement. She notes fatigue and denies nausea, vomiting or muscle weakness.  ASSESSMENT AND PLAN:  Prediabetes  Vitamin D deficiency  Class 1 obesity with serious comorbidity and body mass index (BMI) of 33.0 to 33.9 in adult, unspecified obesity type  PLAN:  Pre-Diabetes Angela [name] will continue to work on weight loss, exercise, and decreasing simple carbohydrates in her diet to help decrease the risk of diabetes. We dicussed metformin including benefits and risks. She was informed that eating too many simple carbohydrates or too many calories at one sitting increases the likelihood of GI side effects. Angela [name] agrees to continue  taking metformin, and she agrees to follow up with our clinic in 2 weeks as directed to monitor her progress.  Vitamin D Deficiency Angela [name] was informed that low vitamin D levels contributes to fatigue and are associated with obesity, breast, and colon cancer. Angela [name] agrees to continue taking Vit D supplement and will follow up for routine testing of vitamin D, at least 2-3 times per year. She was informed of the risk of over-replacement of vitamin D and agrees to not increase her dose unless she discusses this with [place] first. Angela [name] agrees to follow up with our clinic in 2 weeks.  Obesity Angela [name] is currently in the action stage of change. As such, her goal is to continue with weight loss efforts She has agreed to follow the Category 3 plan Angela [name] has been instructed to work up to a goal of 150 minutes of combined cardio and strengthening exercise per week or continue riding stationary bike for 20 minutes 3-4 times per week for weight loss and overall health benefits. We discussed the following Behavioral Modification Strategies today: increasing lean protein intake, increasing vegetables and work on meal planning and easy cooking plans, better snacking choices, and planning for success   Angela [name] has agreed to follow up with our clinic in 2 weeks. She was informed of the importance of frequent follow up visits to maximize her success with intensive lifestyle modifications for her multiple health conditions.  ALLERGIES: Allergies  Allergen Reactions  . Quinoa-Kale-Hemp [Alimentum]     MEDICATIONS:  Current Outpatient Medications on File Prior to Visit  Medication Sig Dispense Refill  . brimonidine-timolol (COMBIGAN) 0.2-0.5 % ophthalmic solution Place 1 drop into the right eye every 12 (twelve) hours.     . cetirizine (ZYRTEC) 10 MG tablet Take 10 mg by mouth daily as needed for allergies.    Marland Kitchen dorzolamide (TRUSOPT) 2 % ophthalmic solution Place 1 drop into the right eye 3 (three) times daily.     . fluticasone (FLONASE) 50 MCG/ACT nasal spray Place 1 spray into both nostrils 2 (two) times daily as needed for allergies.     Marland Kitchen ibuprofen (ADVIL,MOTRIN) 600 MG tablet Take 1 tablet (600 mg total) by mouth every 6 (six) hours as needed for mild pain, moderate pain or cramping. 30 tablet 0  . latanoprost (XALATAN) 0.005 % ophthalmic solution Place 1 drop into the right eye at bedtime.     . metFORMIN (GLUCOPHAGE) 500 MG tablet Take 1 tablet (500 mg total) by mouth daily with breakfast. 30 tablet 0  . Vitamin D, Ergocalciferol, (DRISDOL) 1.25 MG (50000 UT) CAPS capsule Take 1 capsule (50,000 Units total) by mouth every 7 (seven) days. 4 capsule 0  . ferrous sulfate 325 (65 FE) MG tablet Take 1 tablet (325 mg total) by mouth daily. (Patient taking differently: Take 325 mg by mouth every other day. ) 90 tablet 0   No current facility-administered medications on file prior to visit.     PAST MEDICAL HISTORY: Past Medical History:  Diagnosis Date  . Allergy Enviromental  . Anemia may/2017  . Asthma    as a child  . Back pain gerd  . Constipation   . Glaucoma   . Heart murmur childhood  . IBS (irritable bowel syndrome)   . Joint pain   . Menorrhagia   . Palpitations   . Plantar fasciitis   . Prediabetes   . Swelling    feet, legs    PAST SURGICAL HISTORY: Past Surgical History:  Procedure Laterality Date  . CHOLECYSTECTOMY  2005  . COLONOSCOPY WITH PROPOFOL N/A 10/27/2018   Procedure: COLONOSCOPY WITH PROPOFOL;  Surgeon: Lin Landsman, MD;  Location: [facility] ENDOSCOPY;  Service: Gastroenterology;  Laterality: N/A;  . DILATATION & CURETTAGE/HYSTEROSCOPY WITH MYOSURE N/A 06/23/2018   Procedure: DILATATION & CURETTAGE/HYSTEROSCOPY WITH MYOSURE;  Surgeon: Will Bonnet, MD;  Location: [facility] ORS;  Service: Gynecology;  Laterality: N/A;  . DILATION AND CURETTAGE OF UTERUS    . ERCP     3 times  . ESOPHAGOGASTRODUODENOSCOPY (EGD) WITH PROPOFOL  10/27/2018   Procedure:  ESOPHAGOGASTRODUODENOSCOPY (EGD) WITH PROPOFOL;  Surgeon: Lin Landsman, MD;  Location: [facility] ENDOSCOPY;  Service: Gastroenterology;;  . EYE SURGERY    . MENISCUS REPAIR Right    x 2    SOCIAL HISTORY: Social History   Tobacco Use  . Smoking status: Never Smoker  . Smokeless tobacco: Never Used  Substance Use Topics  . Alcohol use: Yes    Alcohol/week: 2.0 standard drinks    Types: 2 Glasses of wine per week    Comment: occasional  . Drug use: No    FAMILY HISTORY: Family History  Problem Relation Age of Onset  . Colon cancer Mother 34  . Diabetes Mother   . Hypertension Mother   . Arthritis Mother   . Transient ischemic attack Mother   . Liver disease Mother   . Obesity Mother   . Diabetes Father   . Heart disease Father   . Esophageal cancer Maternal Grandmother   .  Cancer Maternal Grandmother   . Diabetes Maternal Grandmother   . Alcohol abuse Maternal Grandfather   . Breast cancer Maternal Aunt        mat great aunt    ROS: Review of Systems  Constitutional: Positive for malaise/fatigue and weight loss.  Gastrointestinal: Negative for nausea and vomiting.  Musculoskeletal:       Negative muscle weakness  Endo/Heme/Allergies:       Negative hypoglycemia    PHYSICAL EXAM: Blood pressure 102/70, pulse 81, temperature 98.1 F (36.7 C), temperature source Oral, height 5\' 8"  (1.727 m), weight 217 lb (98.4 kg), last menstrual period 10/25/2018, SpO2 95 %. Body mass index is 32.99 kg/m. Physical Exam Vitals signs reviewed.  Constitutional:      Appearance: Normal appearance. She is obese.  Cardiovascular:     Rate and Rhythm: Normal rate.     Pulses: Normal pulses.  Pulmonary:     Effort: Pulmonary effort is normal.     Breath sounds: Normal breath sounds.  Musculoskeletal: Normal range of motion.  Skin:    General: Skin is warm and dry.  Neurological:     Mental Status: She is alert and oriented to person, place, and time.  Psychiatric:         Mood and Affect: Mood normal.        Behavior: Behavior normal.     RECENT LABS AND TESTS: BMET    Component Value Date/Time   NA 138 10/05/2018 1005   K 4.7 10/05/2018 1005   CL 104 10/05/2018 1005   CO2 23 10/05/2018 1005   GLUCOSE 99 10/05/2018 1005   GLUCOSE 105 (H) 06/13/2018 1006   BUN 13 10/05/2018 1005   CREATININE 0.56 (L) 10/05/2018 1005   CALCIUM 9.4 10/05/2018 1005   GFRNONAA 115 10/05/2018 1005   GFRAA 132 10/05/2018 1005   Lab Results  Component Value Date   HGBA1C 5.7 (H) 10/05/2018   HGBA1C 5.9 06/22/2018   HGBA1C 5.8 06/21/2017   HGBA1C 5.8 08/31/2016   Lab Results  Component Value Date   INSULIN 61.1 (H) 10/05/2018   CBC    Component Value Date/Time   WBC 5.8 10/05/2018 1005   WBC 5.4 09/22/2017 0957   RBC 4.53 10/05/2018 1005   RBC 4.32 09/22/2017 0957   HGB 11.5 10/05/2018 1005   HCT 37.0 10/05/2018 1005   PLT 182 04/29/2018 1007   MCV 82 10/05/2018 1005   MCH 25.4 (L) 10/05/2018 1005   MCHC 31.1 (L) 10/05/2018 1005   MCHC 34.0 09/22/2017 0957   RDW 15.8 (H) 10/05/2018 1005   LYMPHSABS 1.6 10/05/2018 1005   EOSABS 0.2 10/05/2018 1005   BASOSABS 0.0 10/05/2018 1005   Iron/TIBC/Ferritin/ %Sat    Component Value Date/Time   IRON 40 10/05/2018 1005   TIBC 403 10/05/2018 1005   FERRITIN 20 10/05/2018 1005   IRONPCTSAT 10 (L) 10/05/2018 1005   IRONPCTSAT 7 (L) 09/08/2016 1109   Lipid Panel     Component Value Date/Time   CHOL 156 10/05/2018 1005   TRIG 106 10/05/2018 1005   HDL 61 10/05/2018 1005   CHOLHDL 3 06/22/2018 1113   VLDL 20.6 06/22/2018 1113   LDLCALC 74 10/05/2018 1005   Hepatic Function Panel     Component Value Date/Time   PROT 7.3 10/05/2018 1005   ALBUMIN 4.5 10/05/2018 1005   AST 36 10/05/2018 1005   ALT 46 (H) 10/05/2018 1005   ALKPHOS 92 10/05/2018 1005   BILITOT 0.4 10/05/2018 1005  BILIDIR 0.1 06/22/2018 1113      Component Value Date/Time   TSH 2.020 10/05/2018 1005   TSH 1.660 04/29/2018 1007       OBESITY BEHAVIORAL INTERVENTION VISIT  Today's visit was # 3   Starting weight: 220 lbs Starting date: 10/05/2018 Today's weight : 217 lbs Today's date: 11/08/2018 Total lbs lost to date: 3    ASK: We discussed the diagnosis of obesity with Angela [name] today and Angela [name] agreed to give [place] permission to discuss obesity behavioral modification therapy today.  ASSESS: Angela [name] has the diagnosis of obesity and her BMI today is 33 Angela [name] is in the action stage of change   ADVISE: Angela [name] was educated on the multiple health risks of obesity as well as the benefit of weight loss to improve her health. She was advised of the need for long term treatment and the importance of lifestyle modifications to improve her current health and to decrease her risk of future health problems.  AGREE: Multiple dietary modification options and treatment options were discussed and  Angela [name] agreed to follow the recommendations documented in the above note.  ARRANGE: Angela [name] was educated on the importance of frequent visits to treat obesity as outlined per CMS and USPSTF guidelines and agreed to schedule her next follow up appointment today.  I, Trixie Dredge, am acting as transcriptionist for Ilene Qua, MD  I have reviewed the above documentation for accuracy and completeness, and I agree with the above. - Ilene Qua, MD

## 2018-11-14 ENCOUNTER — Encounter (INDEPENDENT_AMBULATORY_CARE_PROVIDER_SITE_OTHER): Payer: Self-pay

## 2018-11-14 NOTE — Progress Notes (Signed)
Office: [phone]  /  Fax: [phone]    Date: November 15, 2018   Appointment Start Time: 4:30pm Duration:21 minutes Provider: Glennie Isle, Psy.D. Type of Session: Individual Therapy  Location of Patient: Home Location of Provider: Provider's Home Type of Contact: Telepsychological Visit via [employer] Web[employer]   Session Content: Lili is a 43 y.o. female presenting via [place] for a follow-up appointment to address the previously established treatment goal of decreasing emotional eating. Today's appointment was a telepsychological visit, as this provider's clinic is seeing a limited number of patients for in-person visits due to COVID-19. Therapeutic services will resume to in-person appointments once deemed appropriate. Kalyiah expressed understanding regarding the rationale for telepsychological services, and provided verbal consent for today's appointment. Prior to proceeding with today's appointment, Epiphany's physical location at the time of this appointment was obtained. Jakirah reported she was at home and provided the address. In the event of technical difficulties, Sieanna shared a phone number she could be reached at. [place] and this provider participated in today's telepsychological service. Also, Chantal denied anyone else being present in the room or on the WebEx appointment.  This provider conducted a brief check-in and verbally administered the PHQ-9 and GAD-7. Bernette shared about recent events, including her birthday celebration. Regarding emotional eating triggers, Rini identified experiencing the following: stress and fatigue. She could not recall any episodes of emotional eating and explained, "I've been more mindful about it." This was positively reinforced. Remainder of session focused on mindfulness. Psychoeducation regarding mindfulness was provided. A handout was provided to Brinda with further information regarding mindfulness, including exercises. This provider also  explained the benefit of mindfulness as it relates to emotional eating. Tashunda was encouraged to engage in the provided exercises between now and the next appointment with this provider. Ishika agreed. She was led through an exercise involving her senses. Exie provided verbal consent during today's appointment for this provider to send the handout on mindfulness via e-mail. Based on Shakaria's self-report about her progress, frequency of appointments will be reduced. Toba was receptive to today's session as evidenced by openness to sharing, responsiveness to feedback, and willingness to engage in mindfulness exercises.  Mental Status Examination:  Appearance: neat Behavior: cooperative Mood: euthymic Affect: mood congruent Speech: normal in rate, volume, and tone Eye Contact: appropriate Psychomotor Activity: appropriate Thought Process: linear, logical, and goal directed  Content/Perceptual Disturbances: denies suicidal and homicidal ideation, plan, and intent and no hallucinations, delusions, bizarre thinking or behavior reported or observed Orientation: time, person, place and purpose of appointment Cognition/Sensorium: memory, attention, language, and fund of knowledge intact  Insight: good Judgment: good  Structured Assessment Results: The Patient Health Questionnaire-9 (PHQ-9) is a self-report measure that assesses symptoms and severity of depression over the course of the last two weeks. Skilar obtained a score of 0. Little interest or pleasure in doing things 0  Feeling down, depressed, or hopeless 0  Trouble falling or staying asleep, or sleeping too much 0  Feeling tired or having little energy 0  Poor appetite or overeating 0  Feeling bad about yourself --- or that you are a failure or have let yourself or your family down 0  Trouble concentrating on things, such as reading the newspaper or watching television 0  Moving or speaking so slowly that other people could have noticed?  Or the opposite --- being so fidgety or restless that you have been moving around a lot more than usual 0  Thoughts that you would be better off  dead or hurting yourself in some way 0  PHQ-9 Score 0    The Generalized Anxiety Disorder-7 (GAD-7) is a brief self-report measure that assesses symptoms of anxiety over the course of the last two weeks. Reilley obtained a score of 0. Feeling nervous, anxious, on edge 0  Not being able to stop or control worrying 0  Worrying too much about different things 0  Trouble relaxing 0  Being so restless that it's hard to sit still 0  Becoming easily annoyed or irritable 0  Feeling afraid as if something awful might happen 0  GAD-7 Score 0   Interventions:  Conducted a brief chart review Verbal administration of PHQ-9 and GAD-7 for symptom monitoring Provided empathic reflections and validation Reviewed content from the previous session Psychoeducation provided regarding mindfulness Engaged patient in a mindfulness exercise Provided positive reinforcement Employed supportive psychotherapy interventions to facilitate reduced distress, and to improve coping skills with identified stressors Employed acceptance and commitment interventions to emphasize mindfulness and acceptance without struggle  DSM-5 Diagnosis: 311 (F32.8) Other Specified Depressive Disorder, Emotional Eating Behaviors  Treatment Goal & Progress: During the initial appointment with this provider, the following treatment goal was established: decrease emotional eating. Evaluna has demonstrated progress in her goal as evidenced by increased awareness of hunger patterns and triggers for emotional eating. During today's appointment, Calia denied episodes of emotional eating since the last appointment with this provider.   Plan: Lillianah continues to appear able and willing to participate as evidenced by engagement in reciprocal conversation, and asking questions for clarification as appropriate.  The next appointment will be scheduled in three weeks, which will be via [employer]. Once this provider's office resumes in-person appointments and it is deemed appropriate, [place] will be notified. The next session will focus further on mindfulness.

## 2018-11-15 ENCOUNTER — Ambulatory Visit (INDEPENDENT_AMBULATORY_CARE_PROVIDER_SITE_OTHER): Payer: 59 | Admitting: Psychology

## 2018-11-15 ENCOUNTER — Other Ambulatory Visit: Payer: Self-pay

## 2018-11-15 DIAGNOSIS — H4031X3 Glaucoma secondary to eye trauma, right eye, severe stage: Secondary | ICD-10-CM | POA: Diagnosis not present

## 2018-11-15 DIAGNOSIS — F3289 Other specified depressive episodes: Secondary | ICD-10-CM | POA: Diagnosis not present

## 2018-11-18 ENCOUNTER — Other Ambulatory Visit (INDEPENDENT_AMBULATORY_CARE_PROVIDER_SITE_OTHER): Payer: Self-pay | Admitting: Family Medicine

## 2018-11-18 ENCOUNTER — Telehealth: Payer: 59 | Admitting: Family

## 2018-11-18 DIAGNOSIS — R399 Unspecified symptoms and signs involving the genitourinary system: Secondary | ICD-10-CM

## 2018-11-18 MED ORDER — CEPHALEXIN 500 MG PO CAPS: 500.0000 mg | ORAL_CAPSULE | Freq: Two times a day (BID) | ORAL | 0 refills | Status: DC

## 2018-11-18 MED FILL — RHOPRESSA 0.02 % SOLN: 0.02 | 45 days supply | Qty: 3 | Fill #0

## 2018-11-18 MED FILL — DORZOLAMIDE HCL 2 % SOLN: 2 | 66 days supply | Qty: 10 | Fill #3

## 2018-11-18 MED FILL — CEPHALEXIN 500 MG CAPSULE: 500 | 7 days supply | Qty: 14 | Fill #0

## 2018-11-18 NOTE — Progress Notes (Signed)

## 2018-11-21 MED ORDER — METFORMIN HCL 500 MG PO TABS: 500.0000 mg | ORAL_TABLET | Freq: Every day | ORAL | 0 refills | Status: DC

## 2018-11-21 MED FILL — metFORMIN HCL 500 MG TABS: 500 | 30 days supply | Qty: 30 | Fill #0

## 2018-11-23 ENCOUNTER — Encounter (INDEPENDENT_AMBULATORY_CARE_PROVIDER_SITE_OTHER): Payer: Self-pay | Admitting: Physician Assistant

## 2018-11-23 ENCOUNTER — Other Ambulatory Visit: Payer: Self-pay

## 2018-11-23 ENCOUNTER — Telehealth: Payer: 59 | Admitting: Physician Assistant

## 2018-11-23 ENCOUNTER — Ambulatory Visit (INDEPENDENT_AMBULATORY_CARE_PROVIDER_SITE_OTHER): Payer: 59 | Admitting: Physician Assistant

## 2018-11-23 VITALS — BP 111/76 | HR 100 | Temp 98.5°F | Ht 68.0 in | Wt 213.0 lb

## 2018-11-23 DIAGNOSIS — Z9189 Other specified personal risk factors, not elsewhere classified: Secondary | ICD-10-CM | POA: Diagnosis not present

## 2018-11-23 DIAGNOSIS — R300 Dysuria: Secondary | ICD-10-CM

## 2018-11-23 DIAGNOSIS — E669 Obesity, unspecified: Secondary | ICD-10-CM | POA: Diagnosis not present

## 2018-11-23 DIAGNOSIS — Z6832 Body mass index (BMI) 32.0-32.9, adult: Secondary | ICD-10-CM

## 2018-11-23 DIAGNOSIS — E559 Vitamin D deficiency, unspecified: Secondary | ICD-10-CM

## 2018-11-23 DIAGNOSIS — R7303 Prediabetes: Secondary | ICD-10-CM | POA: Diagnosis not present

## 2018-11-23 MED ORDER — VITAMIN D (ERGOCALCIFEROL) 1.25 MG (50000 UNIT) PO CAPS: 50000.0000 [IU] | ORAL_CAPSULE | ORAL | 0 refills | Status: DC

## 2018-11-23 MED FILL — VIT D2 1.25 MG (50,000 UNIT: 1.25 MG | 28 days supply | Qty: 4 | Fill #0

## 2018-11-24 NOTE — Progress Notes (Signed)
Based on what you shared with me, I feel your condition warrants further evaluation and I recommend that you be seen for a face to face office visit.  NOTE: If you entered your credit card information for this eVisit, you will not be charged. You may see a "hold" on your card for the $35 but that hold will drop off and you will not have a charge processed.  If you are having a true medical emergency please call 911.     For an urgent face to face visit, [facility] has five urgent care centers for your convenience:    [email or web address] to reserve your spot online an avoid wait times  [facility] (New Address!) [address], [place], [place] [address] *Just off [address], across the road from [place] hours of operation: Monday-Friday, 12 PM to 6 PM  Closed Saturday & Sunday   The following sites will take your insurance:  . [facility]    [phone]                  Get Driving Directions  [address], [place] [address] . 10 am to 8 pm Monday-Friday . 12 pm to 8 pm Saturday-Sunday   . [facility] at [place]                  Get Driving Directions  [address], [place], [place] [address] . 8 am to 8 pm Monday-Friday . 9 am to 6 pm Saturday . 11 am to 6 pm Sunday   . [facility] at [place]                  Get Driving Directions   [address], [place] [address] . 8 am to 8 pm Monday-Friday . 8 am to 4 pm Saturday-Sunday    . [facility] at [place]                    Get Driving Directions  [phone]  [address] [place], [place] [address]  . Monday-Friday, 12 PM to 6 PM    Your e-visit answers were reviewed by a board certified advanced clinical practitioner to complete your personal care plan.  Thank you for using e-Visits.     I have spent 5 minutes in review of e-visit questionnaire, review and updating patient chart, medical decision making and response to patient.    Tenna Delaine, PA-C

## 2018-11-24 NOTE — Progress Notes (Signed)
Office: [phone]  /  Fax: [phone]   HPI:   Chief Complaint: OBESITY Angela [name] is here to discuss her progress with her obesity treatment plan. She is on the Category 3 plan and is following her eating plan approximately 95 % of the time. She states she is riding the stationary bike 15 to 20 minutes 3 times per week. [name] did very well with the plan over the last two weeks. She reports some hunger after lunch, especially after eating microwave meals. [name] is asking about other snack calorie options for her sweet tooth. Her weight is 213 lb (96.6 kg) today and has had a weight loss of 4 pounds over a period of 2 weeks since her last visit. She has lost 7 lbs since starting treatment with [place].  Vitamin D deficiency [name] has a diagnosis of vitamin D deficiency. [name] is currently taking vit D and she denies nausea, vomiting or muscle weakness.  At risk for osteopenia and osteoporosis [name] is at higher risk of osteopenia and osteoporosis due to vitamin D deficiency.   Pre-Diabetes [name] has a diagnosis of prediabetes based on her elevated Hgb A1c and was informed this puts her at greater risk of developing diabetes. [name] is on metformin currently and she continues to work on diet and exercise to decrease risk of diabetes. She denies nausea, vomiting or diarrhea.  ASSESSMENT AND PLAN:  Prediabetes  Vitamin D deficiency - Plan: Vitamin D, Ergocalciferol, (DRISDOL) 1.25 MG (50000 UT) CAPS capsule  At risk for osteoporosis  Class 1 obesity with serious comorbidity and body mass index (BMI) of 32.0 to 32.9 in adult, unspecified obesity type  PLAN:  Vitamin D Deficiency [name] was informed that low vitamin D levels contributes to fatigue and are associated with obesity, breast, and colon cancer. She agrees to continue to take prescription Vit D @50 ,000 IU every week #4 with no refills and will follow up for routine testing of vitamin D, at least 2-3 times per year. She was  informed of the risk of over-replacement of vitamin D and agrees to not increase her dose unless she discusses this with [place] first. [name] agrees to follow up with our clinic in 2 weeks.  At risk for osteopenia and osteoporosis [name] was given extended  (15 minutes) osteoporosis prevention counseling today. [name] is at risk for osteopenia and osteoporosis due to her vitamin D deficiency. She was encouraged to take her vitamin D and follow her higher calcium diet and increase strengthening exercise to help strengthen her bones and decrease her risk of osteopenia and osteoporosis.  Pre-Diabetes [name] will continue to work on weight loss, exercise, and decreasing simple carbohydrates in her diet to help decrease the risk of diabetes. We dicussed metformin including benefits and risks. She was informed that eating too many simple carbohydrates or too many calories at one sitting increases the likelihood of GI side effects. [name] will continue metformin for now and a prescription was not written today. [name] agreed to follow up with [place] as directed to monitor her progress.  Obesity [name] is currently in the action stage of change. As such, her goal is to continue with weight loss efforts She has agreed to follow the Category 3 plan [name] has been instructed to work up to a goal of 150 minutes of combined cardio and strengthening exercise per week for weight loss and overall health benefits. We discussed the following Behavioral Modification Strategies today: keeping healthy foods in the home work on meal planning and  easy cooking plans  [name] has agreed to follow up with our clinic in 2 weeks. She was informed of the importance of frequent follow up visits to maximize her success with intensive lifestyle modifications for her multiple health conditions.  ALLERGIES: Allergies  Allergen Reactions   Quinoa-Kale-Hemp [Alimentum]     MEDICATIONS: Current Outpatient Medications on File Prior to  Visit  Medication Sig Dispense Refill   brimonidine-timolol (COMBIGAN) 0.2-0.5 % ophthalmic solution Place 1 drop into the right eye every 12 (twelve) hours.      cephALEXin (KEFLEX) 500 MG capsule Take 1 capsule (500 mg total) by mouth 2 (two) times daily. 14 capsule 0   cetirizine (ZYRTEC) 10 MG tablet Take 10 mg by mouth daily as needed for allergies.     dorzolamide (TRUSOPT) 2 % ophthalmic solution Place 1 drop into the right eye 3 (three) times daily.     ferrous sulfate 325 (65 FE) MG tablet Take 1 tablet (325 mg total) by mouth daily. (Patient taking differently: Take 325 mg by mouth every other day. ) 90 tablet 0   fluticasone (FLONASE) 50 MCG/ACT nasal spray Place 1 spray into both nostrils 2 (two) times daily as needed for allergies.      ibuprofen (ADVIL,MOTRIN) 600 MG tablet Take 1 tablet (600 mg total) by mouth every 6 (six) hours as needed for mild pain, moderate pain or cramping. 30 tablet 0   latanoprost (XALATAN) 0.005 % ophthalmic solution Place 1 drop into the right eye at bedtime.      metFORMIN (GLUCOPHAGE) 500 MG tablet Take 1 tablet (500 mg total) by mouth daily with breakfast. 30 tablet 0   No current facility-administered medications on file prior to visit.     PAST MEDICAL HISTORY: Past Medical History:  Diagnosis Date   Allergy Enviromental   Anemia may/2017   Asthma    as a child   Back pain gerd   Constipation    Glaucoma    Heart murmur childhood   IBS (irritable bowel syndrome)    Joint pain    Menorrhagia    Palpitations    Plantar fasciitis    Prediabetes    Swelling    feet, legs    PAST SURGICAL HISTORY: Past Surgical History:  Procedure Laterality Date   CHOLECYSTECTOMY  2005   COLONOSCOPY WITH PROPOFOL N/A 10/27/2018   Procedure: COLONOSCOPY WITH PROPOFOL;  Surgeon: Lin Landsman, MD;  Location: [facility];  Service: Gastroenterology;  Laterality: N/A;   DILATATION & CURETTAGE/HYSTEROSCOPY WITH MYOSURE  N/A 06/23/2018   Procedure: DILATATION & CURETTAGE/HYSTEROSCOPY WITH MYOSURE;  Surgeon: Will Bonnet, MD;  Location: [facility];  Service: Gynecology;  Laterality: N/A;   DILATION AND CURETTAGE OF UTERUS     ERCP     3 times   ESOPHAGOGASTRODUODENOSCOPY (EGD) WITH PROPOFOL  10/27/2018   Procedure: ESOPHAGOGASTRODUODENOSCOPY (EGD) WITH PROPOFOL;  Surgeon: Lin Landsman, MD;  Location: [facility];  Service: Gastroenterology;;   EYE SURGERY     MENISCUS REPAIR Right    x 2    SOCIAL HISTORY: Social History   Tobacco Use   Smoking status: Never Smoker   Smokeless tobacco: Never Used  Substance Use Topics   Alcohol use: Yes    Alcohol/week: 2.0 standard drinks    Types: 2 Glasses of wine per week    Comment: occasional   Drug use: No    FAMILY HISTORY: Family History  Problem Relation Age of Onset   Colon cancer Mother 31  Diabetes Mother    Hypertension Mother    Arthritis Mother    Transient ischemic attack Mother    Liver disease Mother    Obesity Mother    Diabetes Father    Heart disease Father    Esophageal cancer Maternal Grandmother    Cancer Maternal Grandmother    Diabetes Maternal Grandmother    Alcohol abuse Maternal Grandfather    Breast cancer Maternal Aunt        mat great aunt    ROS: Review of Systems  Constitutional: Positive for weight loss.  Gastrointestinal: Negative for diarrhea, nausea and vomiting.  Musculoskeletal:       Negative for muscle weakness    PHYSICAL EXAM: Blood pressure 111/76, pulse 100, temperature 98.5 F (36.9 C), temperature source Oral, height 5\' 8"  (1.727 m), weight 213 lb (96.6 kg), last menstrual period 10/25/2018, SpO2 97 %. Body mass index is 32.39 kg/m. Physical Exam Vitals signs reviewed.  Constitutional:      Appearance: Normal appearance. She is well-developed. She is obese.  Cardiovascular:     Rate and Rhythm: Normal rate.  Pulmonary:     Effort: Pulmonary effort is  normal.  Musculoskeletal: Normal range of motion.  Skin:    General: Skin is warm and dry.  Neurological:     Mental Status: She is alert and oriented to person, place, and time.  Psychiatric:        Mood and Affect: Mood normal.        Behavior: Behavior normal.     RECENT LABS AND TESTS: BMET    Component Value Date/Time   NA 138 10/05/2018 1005   K 4.7 10/05/2018 1005   CL 104 10/05/2018 1005   CO2 23 10/05/2018 1005   GLUCOSE 99 10/05/2018 1005   GLUCOSE 105 (H) 06/13/2018 1006   BUN 13 10/05/2018 1005   CREATININE 0.56 (L) 10/05/2018 1005   CALCIUM 9.4 10/05/2018 1005   GFRNONAA 115 10/05/2018 1005   GFRAA 132 10/05/2018 1005   Lab Results  Component Value Date   HGBA1C 5.7 (H) 10/05/2018   HGBA1C 5.9 06/22/2018   HGBA1C 5.8 06/21/2017   HGBA1C 5.8 08/31/2016   Lab Results  Component Value Date   INSULIN 61.1 (H) 10/05/2018   CBC    Component Value Date/Time   WBC 5.8 10/05/2018 1005   WBC 5.4 09/22/2017 0957   RBC 4.53 10/05/2018 1005   RBC 4.32 09/22/2017 0957   HGB 11.5 10/05/2018 1005   HCT 37.0 10/05/2018 1005   PLT 182 04/29/2018 1007   MCV 82 10/05/2018 1005   MCH 25.4 (L) 10/05/2018 1005   MCHC 31.1 (L) 10/05/2018 1005   MCHC 34.0 09/22/2017 0957   RDW 15.8 (H) 10/05/2018 1005   LYMPHSABS 1.6 10/05/2018 1005   EOSABS 0.2 10/05/2018 1005   BASOSABS 0.0 10/05/2018 1005   Iron/TIBC/Ferritin/ %Sat    Component Value Date/Time   IRON 40 10/05/2018 1005   TIBC 403 10/05/2018 1005   FERRITIN 20 10/05/2018 1005   IRONPCTSAT 10 (L) 10/05/2018 1005   IRONPCTSAT 7 (L) 09/08/2016 1109   Lipid Panel     Component Value Date/Time   CHOL 156 10/05/2018 1005   TRIG 106 10/05/2018 1005   HDL 61 10/05/2018 1005   CHOLHDL 3 06/22/2018 1113   VLDL 20.6 06/22/2018 1113   LDLCALC 74 10/05/2018 1005   Hepatic Function Panel     Component Value Date/Time   PROT 7.3 10/05/2018 1005   ALBUMIN 4.5  10/05/2018 1005   AST 36 10/05/2018 1005   ALT 46  (H) 10/05/2018 1005   ALKPHOS 92 10/05/2018 1005   BILITOT 0.4 10/05/2018 1005   BILIDIR 0.1 06/22/2018 1113      Component Value Date/Time   TSH 2.020 10/05/2018 1005   TSH 1.660 04/29/2018 1007     Ref. Range 10/05/2018 10:05  Vitamin D, 25-Hydroxy Latest Ref Range: 30.0 - 100.0 ng/mL 11.4 (L)    OBESITY BEHAVIORAL INTERVENTION VISIT  Today's visit was # 4   Starting weight: 220 lbs Starting date: 10/05/2018 Today's weight : 213 lbs  Today's date: 11/23/2018 Total lbs lost to date: 7    11/23/2018  Height 5\' 8"  (1.727 m)  Weight 213 lb (96.6 kg)  BMI (Calculated) 32.39  BLOOD PRESSURE - SYSTOLIC 333  BLOOD PRESSURE - DIASTOLIC 76   Body Fat % 83.2 %  Total Body Water (lbs) 83.8 lbs    ASK: We discussed the diagnosis of obesity with Calleen Rodriguez-Guzman today and Christie agreed to give [place] permission to discuss obesity behavioral modification therapy today.  ASSESS: Rylea has the diagnosis of obesity and her BMI today is  Pailyn is in the action stage of change   ADVISE: Jream was educated on the multiple health risks of obesity as well as the benefit of weight loss to improve her health. She was advised of the need for long term treatment and the importance of lifestyle modifications to improve her current health and to decrease her risk of future health problems.  AGREE: Multiple dietary modification options and treatment options were discussed and  Sydelle agreed to follow the recommendations documented in the above note.  ARRANGE: Shirlie was educated on the importance of frequent visits to treat obesity as outlined per CMS and USPSTF guidelines and agreed to schedule her next follow up appointment today.  Corey Skains, am acting as transcriptionist for [name], PA-C I, [name], PA-C have reviewed above note and agree with its content

## 2018-11-25 ENCOUNTER — Other Ambulatory Visit: Payer: Self-pay

## 2018-11-25 ENCOUNTER — Ambulatory Visit (INDEPENDENT_AMBULATORY_CARE_PROVIDER_SITE_OTHER): Payer: 59 | Admitting: Family Medicine

## 2018-11-25 ENCOUNTER — Encounter: Payer: Self-pay | Admitting: Family Medicine

## 2018-11-25 VITALS — BP 110/78 | HR 86 | Temp 98.1°F | Ht 69.0 in | Wt 217.5 lb

## 2018-11-25 DIAGNOSIS — R300 Dysuria: Secondary | ICD-10-CM

## 2018-11-25 DIAGNOSIS — N9489 Other specified conditions associated with female genital organs and menstrual cycle: Secondary | ICD-10-CM

## 2018-11-25 DIAGNOSIS — N949 Unspecified condition associated with female genital organs and menstrual cycle: Secondary | ICD-10-CM

## 2018-11-25 HISTORY — DX: Other specified conditions associated with female genital organs and menstrual cycle: N94.89

## 2018-11-25 HISTORY — DX: Unspecified condition associated with female genital organs and menstrual cycle: N94.9

## 2018-11-25 HISTORY — DX: Dysuria: R30.0

## 2018-11-25 MED ORDER — FLUCONAZOLE 150 MG PO TABS: 150.0000 mg | ORAL_TABLET | Freq: Once | ORAL | 1 refills | Status: AC

## 2018-11-25 NOTE — Addendum Note (Signed)
Addended by: Carter Kitten on: 11/25/2018 09:46 AM   Modules accepted: Orders

## 2018-11-25 NOTE — Patient Instructions (Signed)
Complete fluconazole course x 1 tab.. may repeat in 3 days if vaginal burning not resolving.  Increase fluids.  We will call with Urine culture results.

## 2018-11-25 NOTE — Assessment & Plan Note (Signed)
No wet prep performed given heavy menses... likely yeast infeciton.. treat with fluconazole.

## 2018-11-25 NOTE — Progress Notes (Signed)
Chief Complaint  Patient presents with  . Burning with Urination    recently treated with Keflex at evist but did not help    History of Present Illness: Dysuria  This is a new problem. The current episode started 1 to 4 weeks ago (2 weeks). The problem has been gradually worsening. The quality of the pain is described as burning (at end of urine stream, burning is improved but not quite gone.. now vaginal itching, burning, no vaginal diachare, menses started.). The pain is moderate. There has been no fever. She is sexually active. There is no history of pyelonephritis. Pertinent negatives include no flank pain, frequency, hesitancy, nausea, urgency or vomiting. She has tried antibiotics (treated with keflex x 7 days with e-visit) for the symptoms. The treatment provided no relief. There is no history of catheterization, kidney stones, recurrent UTIs, a single kidney, urinary stasis or a urological procedure. last UTI 3 months ago   Urinalysis  Keflex helped a little with internal dysuria.. but now also external vaginal burning and itching   currently with heavy menses started yesterday. [facility] Kitchen COVID 19 screen No recent travel or known exposure to COVID19 The patient denies respiratory symptoms of COVID 19 at this time.  The importance of social distancing was discussed today.   Review of Systems  Gastrointestinal: Negative for nausea and vomiting.  Genitourinary: Positive for dysuria. Negative for flank pain, frequency, hesitancy and urgency.      Past Medical History:  Diagnosis Date  . Allergy Enviromental  . Anemia may/2017  . Asthma    as a child  . Back pain gerd  . Constipation   . Glaucoma   . Heart murmur childhood  . IBS (irritable bowel syndrome)   . Joint pain   . Menorrhagia   . Palpitations   . Plantar fasciitis   . Prediabetes   . Swelling    feet, legs    reports that she has never smoked. She has never used smokeless tobacco. She reports current alcohol use  of about 2.0 standard drinks of alcohol per week. She reports that she does not use drugs.   Current Outpatient Medications:  .  brimonidine-timolol (COMBIGAN) 0.2-0.5 % ophthalmic solution, Place 1 drop into the right eye every 12 (twelve) hours. , Disp: , Rfl:  .  cetirizine (ZYRTEC) 10 MG tablet, Take 10 mg by mouth daily as needed for allergies., Disp: , Rfl:  .  dorzolamide (TRUSOPT) 2 % ophthalmic solution, Place 1 drop into the right eye 3 (three) times daily., Disp: , Rfl:  .  ferrous sulfate 325 (65 FE) MG tablet, Take 1 tablet (325 mg total) by mouth daily. (Patient taking differently: Take 325 mg by mouth every other day. ), Disp: 90 tablet, Rfl: 0 .  fluticasone (FLONASE) 50 MCG/ACT nasal spray, Place 1 spray into both nostrils 2 (two) times daily as needed for allergies. , Disp: , Rfl:  .  ibuprofen (ADVIL,MOTRIN) 600 MG tablet, Take 1 tablet (600 mg total) by mouth every 6 (six) hours as needed for mild pain, moderate pain or cramping., Disp: 30 tablet, Rfl: 0 .  latanoprost (XALATAN) 0.005 % ophthalmic solution, Place 1 drop into the right eye at bedtime. , Disp: , Rfl:  .  metFORMIN (GLUCOPHAGE) 500 MG tablet, Take 1 tablet (500 mg total) by mouth daily with breakfast., Disp: 30 tablet, Rfl: 0 .  RHOPRESSA 0.02 % SOLN, , Disp: , Rfl:  .  Vitamin D, Ergocalciferol, (DRISDOL) 1.25 MG (50000 UT)  CAPS capsule, Take 1 capsule (50,000 Units total) by mouth every 7 (seven) days., Disp: 4 capsule, Rfl: 0   Observations/Objective: Blood pressure 110/78, pulse 86, temperature 98.1 F (36.7 C), temperature source Temporal, height 5\' 9"  (1.753 m), weight 217 lb 8 oz (98.7 kg), last menstrual period 11/24/2018, SpO2 97 %.  Physical Exam Constitutional:      General: She is not in acute distress.    Appearance: Normal appearance. She is well-developed. She is not ill-appearing or toxic-appearing.  HENT:     Head: Normocephalic.     Right Ear: Hearing, tympanic membrane, ear canal and  external ear normal. Tympanic membrane is not erythematous, retracted or bulging.     Left Ear: Hearing, tympanic membrane, ear canal and external ear normal. Tympanic membrane is not erythematous, retracted or bulging.     Nose: No mucosal edema or rhinorrhea.     Right Sinus: No maxillary sinus tenderness or frontal sinus tenderness.     Left Sinus: No maxillary sinus tenderness or frontal sinus tenderness.     Mouth/Throat:     Pharynx: Uvula midline.  Eyes:     General: Lids are normal. Lids are everted, no foreign bodies appreciated.     Conjunctiva/sclera: Conjunctivae normal.     Pupils: Pupils are equal, round, and reactive to light.  Neck:     Musculoskeletal: Normal range of motion and neck supple.     Thyroid: No thyroid mass or thyromegaly.     Vascular: No carotid bruit.     Trachea: Trachea normal.  Cardiovascular:     Rate and Rhythm: Normal rate and regular rhythm.     Pulses: Normal pulses.     Heart sounds: Normal heart sounds, S1 normal and S2 normal. No murmur. No friction rub. No gallop.   Pulmonary:     Effort: Pulmonary effort is normal. No tachypnea or respiratory distress.     Breath sounds: Normal breath sounds. No decreased breath sounds, wheezing, rhonchi or rales.  Abdominal:     General: Bowel sounds are normal.     Palpations: Abdomen is soft.     Tenderness: There is abdominal tenderness in the suprapubic area.     Comments: Mild suprapubic tenderness she feels from her menses  Skin:    General: Skin is warm and dry.     Findings: No rash.  Neurological:     Mental Status: She is alert.  Psychiatric:        Mood and Affect: Mood is not anxious or depressed.        Speech: Speech normal.        Behavior: Behavior normal. Behavior is cooperative.        Thought Content: Thought content normal.        Judgment: Judgment normal.      Assessment and Plan   Dysuria Difficult to determine symptoms from continued UTI or if from new likey yeast  infection vaginally after use of antibitoics.  Cannot do UA and micro today as heavy menses.. will attemp to collect and send for culture to determine if bacteria resistant to keflex present.  Increase water intake.  Vaginal burning No wet prep performed given heavy menses... likely yeast infeciton.. treat with fluconazole.     Eliezer Lofts, MD

## 2018-11-25 NOTE — Assessment & Plan Note (Signed)
Difficult to determine symptoms from continued UTI or if from new likey yeast infection vaginally after use of antibitoics.  Cannot do UA and micro today as heavy menses.. will attemp to collect and send for culture to determine if bacteria resistant to keflex present.  Increase water intake.

## 2018-11-26 LAB — URINE CULTURE
MICRO NUMBER:: 748773
Result:: NO GROWTH
SPECIMEN QUALITY:: ADEQUATE

## 2018-11-30 DIAGNOSIS — H10411 Chronic giant papillary conjunctivitis, right eye: Secondary | ICD-10-CM | POA: Diagnosis not present

## 2018-11-30 NOTE — Progress Notes (Signed)
Office: [phone]  /  Fax: [phone]    Date: December 06, 2018   Appointment Start Time: 4:30pm Duration: 24 minutes Provider: Glennie Isle, Psy.D. Type of Session: Individual Therapy  Location of Patient: Home Location of Provider: Provider's Home Type of Contact: Telepsychological Visit via [employer] Web[employer]   Session Content: Angela [name] is a 43 y.o. female presenting via [place] for a follow-up appointment to address the previously established treatment goal of decreasing emotional eating. Today's appointment was a telepsychological visit, as this provider's clinic is seeing a limited number of patients for in-person visits due to COVID-19. Therapeutic services will resume to in-person appointments once deemed appropriate. Angela [name] expressed understanding regarding the rationale for telepsychological services, and provided verbal consent for today's appointment. Prior to proceeding with today's appointment, Angela [name]'s physical location at the time of this appointment was obtained. Angela [name] reported she was at home and provided the address. In the event of technical difficulties, Angela [name] shared a phone number she could be reached at. Angela [name] and this provider participated in today's telepsychological service. Also, Angela [name] denied anyone else being present in the room or on the WebEx appointment.  This provider conducted a brief check-in and verbally administered the PHQ-9 and GAD-7. Angela [name] shared, "Work was better the week after we talked." She also shared her appointment with Angela Potash, PA-C today "went well." She believes she was not eating enough calories and a plan was developed with [name] to address the concern. Moreover, Angela [name] reported the previously shared skills have been "working." She described engaging in mindfulness, specifically at work. This was positively reinforced. This provider discussed the utilization of YouTube for mindfulness exercises (e.g., videos by Merri Ray).  Additionally, Angela [name] was led through a mindfulness during today's appointment focusing on her breath as an anchor. Following the exercise, her experience was processed. Angela [name] stated, "That was interesting."  She added, "I feel like my arms are heavier" due to comfort and increased relaxation. To further assist with mindfulness, psychoeducation regarding formal (e.g., setting aside a specific time daily to engage in an exercise) and informal (e.g., cultivating awareness in the present moment and taking a non-judgmental approach while engaging in day-to-day tasks) mindfulness was provided. Angela [name] provided verbal consent during today's appointment for this provider to send the handout for today's exercise via e-mail.  Furthermore, termination planning was discussed. If progress continues as evidenced by Angela [name]'s self-report, the next appointment will be the last appointment with this provider. Overall, Angela [name] was receptive to today's session as evidenced by openness to sharing, responsiveness to feedback, and willingness to continue engaging in mindfulness exercises.  Mental Status Examination:  Appearance: neat Behavior: cooperative Mood: euthymic Affect: mood congruent Speech: normal in rate, volume, and tone Eye Contact: appropriate Psychomotor Activity: appropriate Thought Process: linear, logical, and goal directed  Content/Perceptual Disturbances: no hallucinations, delusions, bizarre thinking or behavior reported or observed and no evidence of suicidal and homicidal ideation, plan, and intent Orientation: time, person, place and purpose of appointment Cognition/Sensorium: memory, attention, language, and fund of knowledge intact  Insight: good Judgment: good  Structured Assessment Results: The Patient Health Questionnaire-9 (PHQ-9) is a self-report measure that assesses symptoms and severity of depression over the course of the last two weeks. Angela [name] obtained a score of 0. Little interest or  pleasure in doing things 0  Feeling down, depressed, or hopeless 0  Trouble falling or staying asleep, or sleeping too much 0  Feeling tired or having little energy 0  Poor appetite or overeating 0  Feeling bad about yourself --- or that you are a failure or have let yourself or your family down 0  Trouble concentrating on things, such as reading the newspaper or watching television 0  Moving or speaking so slowly that other people could have noticed? Or the opposite --- being so fidgety or restless that you have been moving around a lot more than usual 0  Thoughts that you would be better off dead or hurting yourself in some way 0  PHQ-9 Score 0    The Generalized Anxiety Disorder-7 (GAD-7) is a brief self-report measure that assesses symptoms of anxiety over the course of the last two weeks. Angela [name] obtained a score of 0. Feeling nervous, anxious, on edge 0  Not being able to stop or control worrying 0  Worrying too much about different things 0  Trouble relaxing 0  Being so restless that it's hard to sit still 0  Becoming easily annoyed or irritable 0  Feeling afraid as if something awful might happen 0  GAD-7 Score 0   Interventions:  Conducted a brief chart review Verbal administration of PHQ-9 and GAD-7 for symptom monitoring Provided empathic reflections and validation Reviewed content from the previous session Engaged patient in a mindfulness exercise Discussed termination planning Provided positive reinforcement Employed supportive psychotherapy interventions to facilitate reduced distress, and to improve coping skills with identified stressors Employed acceptance and commitment interventions to emphasize mindfulness and acceptance without struggle Further psychoeducation provided regarding mindfulness  DSM-5 Diagnosis: 311 (F32.8) Other Specified Depressive Disorder, Emotional Eating Behaviors  Treatment Goal & Progress: During the initial appointment with this provider,  the following treatment goal was established: decrease emotional eating. Angela [name] has demonstrated progress in her goal as evidenced by increased awareness of hunger patterns and triggers for emotional eating. She continues to engage in learned skills.   Plan: Kathleene continues to appear able and willing to participate as evidenced by engagement in reciprocal conversation, and asking questions for clarification as appropriate. Per Janisha's request, the next appointment will be scheduled in one month, which will be via [employer]. The next session will focus on reviewing learned skills, and termination.

## 2018-12-06 ENCOUNTER — Other Ambulatory Visit: Payer: Self-pay

## 2018-12-06 ENCOUNTER — Ambulatory Visit (INDEPENDENT_AMBULATORY_CARE_PROVIDER_SITE_OTHER): Payer: 59 | Admitting: Physician Assistant

## 2018-12-06 ENCOUNTER — Ambulatory Visit (INDEPENDENT_AMBULATORY_CARE_PROVIDER_SITE_OTHER): Payer: 59 | Admitting: Psychology

## 2018-12-06 VITALS — HR 93 | Temp 98.6°F | Ht 68.0 in | Wt 212.8 lb

## 2018-12-06 DIAGNOSIS — R7303 Prediabetes: Secondary | ICD-10-CM | POA: Diagnosis not present

## 2018-12-06 DIAGNOSIS — Z9189 Other specified personal risk factors, not elsewhere classified: Secondary | ICD-10-CM | POA: Diagnosis not present

## 2018-12-06 DIAGNOSIS — Z6832 Body mass index (BMI) 32.0-32.9, adult: Secondary | ICD-10-CM | POA: Diagnosis not present

## 2018-12-06 DIAGNOSIS — E669 Obesity, unspecified: Secondary | ICD-10-CM

## 2018-12-06 DIAGNOSIS — E559 Vitamin D deficiency, unspecified: Secondary | ICD-10-CM | POA: Diagnosis not present

## 2018-12-06 DIAGNOSIS — F3289 Other specified depressive episodes: Secondary | ICD-10-CM | POA: Diagnosis not present

## 2018-12-06 DIAGNOSIS — E66811 Obesity, class 1: Secondary | ICD-10-CM

## 2018-12-06 MED ORDER — METFORMIN HCL 500 MG PO TABS: 500.0000 mg | ORAL_TABLET | Freq: Two times a day (BID) | ORAL | 0 refills | Status: DC

## 2018-12-07 MED FILL — COMBIGAN EYE DROPS: 0.2-0.5 | 30 days supply | Qty: 5 | Fill #7

## 2018-12-12 MED FILL — LATANOPROST 0.005% EYE DRP: 0.005 | 50 days supply | Qty: 3 | Fill #0

## 2018-12-12 MED FILL — metFORMIN HCL 500 MG TABS: 500 | 30 days supply | Qty: 60 | Fill #0

## 2018-12-12 NOTE — Progress Notes (Signed)
Office: [phone]  /  Fax: [phone]   HPI:   Chief Complaint: OBESITY Angela [name] is here to discuss her progress with her obesity treatment plan. She is on the Category 3 plan and is following her eating plan approximately 90-95 % of the time. She states she is exercising 0 minutes 0 times per week. [name] reports that she is following the plan closely. She is not getting in all of her snack calories.  Her weight is 212 lb 12.8 oz (96.5 kg) today and has had a weight loss of 1 pound over a period of 2 weeks since her last visit. She has lost 8 lbs since starting treatment with [place].  Pre-Diabetes Angela [name] has a diagnosis of pre-diabetes based on her elevated Hgb A1c and was informed this puts her at greater risk of developing diabetes. She reports polyphagia, and denies nausea, vomiting, diarrhea, or hypoglycemia. She is taking metformin once daily and continues to work on diet and exercise to decrease risk of diabetes.   At risk for diabetes Angela [name] is at higher than average risk for developing diabetes due to her obesity and pre-diabetes. She currently denies polyuria or polydipsia.  Vitamin D Deficiency Angela [name] has a diagnosis of vitamin D deficiency. She is currently taking prescription Vit D and denies nausea, vomiting or muscle weakness.  ASSESSMENT AND PLAN:  Prediabetes - Plan: metFORMIN (GLUCOPHAGE) 500 MG tablet  Vitamin D deficiency  At risk for diabetes mellitus  Class 1 obesity with serious comorbidity and body mass index (BMI) of 32.0 to 32.9 in adult, unspecified obesity type  PLAN:  Pre-Diabetes Angela [name] will continue to work on weight loss, exercise, and decreasing simple carbohydrates in her diet to help decrease the risk of diabetes. We dicussed metformin including benefits and risks. She was informed that eating too many simple carbohydrates or too many calories at one sitting increases the likelihood of GI side effects. Angela [name] agrees to increase metformin to 500 mg  BID at breakfast and lunch #60 with no refills. Angela [name] agrees to follow up with our clinic in 2 weeks as directed to monitor her progress.  Diabetes risk counseling Angela [name] was given extended (15 minutes) diabetes prevention counseling today. She is 43 y.o. female and has risk factors for diabetes including obesity and pre-diabetes. We discussed intensive lifestyle modifications today with an emphasis on weight loss as well as increasing exercise and decreasing simple carbohydrates in her diet.  Vitamin D Deficiency Angela [name] was informed that low vitamin D levels contributes to fatigue and are associated with obesity, breast, and colon cancer. Angela [name] agrees to continue taking prescription Vit D 50,000 IU every week and will follow up for routine testing of vitamin D, at least 2-3 times per year. She was informed of the risk of over-replacement of vitamin D and agrees to not increase her dose unless she discusses this with [place] first. Angela [name] agrees to follow up with our clinic in 2 weeks.  Obesity Angela [name] is currently in the action stage of change. As such, her goal is to continue with weight loss efforts She has agreed to follow the Category 3 plan Angela [name] has been instructed to work up to a goal of 150 minutes of combined cardio and strengthening exercise per week for weight loss and overall health benefits. We discussed the following Behavioral Modification Strategies today: work on meal planning and easy cooking plans and keeping healthy foods in the home   Angela [name] has agreed to follow up with our clinic in 2 weeks. She  was informed of the importance of frequent follow up visits to maximize her success with intensive lifestyle modifications for her multiple health conditions.  ALLERGIES: Allergies  Allergen Reactions   Quinoa-Kale-Hemp [Alimentum]     MEDICATIONS: Current Outpatient Medications on File Prior to Visit  Medication Sig Dispense Refill   brimonidine-timolol (COMBIGAN) 0.2-0.5 %  ophthalmic solution Place 1 drop into the right eye every 12 (twelve) hours.      cetirizine (ZYRTEC) 10 MG tablet Take 10 mg by mouth daily as needed for allergies.     dorzolamide (TRUSOPT) 2 % ophthalmic solution Place 1 drop into the right eye 3 (three) times daily.     ferrous sulfate 325 (65 FE) MG tablet Take 1 tablet (325 mg total) by mouth daily. (Patient taking differently: Take 325 mg by mouth every other day. ) 90 tablet 0   fluticasone (FLONASE) 50 MCG/ACT nasal spray Place 1 spray into both nostrils 2 (two) times daily as needed for allergies.      ibuprofen (ADVIL,MOTRIN) 600 MG tablet Take 1 tablet (600 mg total) by mouth every 6 (six) hours as needed for mild pain, moderate pain or cramping. 30 tablet 0   latanoprost (XALATAN) 0.005 % ophthalmic solution Place 1 drop into the right eye at bedtime.      RHOPRESSA 0.02 % SOLN      Vitamin D, Ergocalciferol, (DRISDOL) 1.25 MG (50000 UT) CAPS capsule Take 1 capsule (50,000 Units total) by mouth every 7 (seven) days. 4 capsule 0   No current facility-administered medications on file prior to visit.     PAST MEDICAL HISTORY: Past Medical History:  Diagnosis Date   Allergy Enviromental   Anemia may/2017   Asthma    as a child   Back pain gerd   Constipation    Glaucoma    Heart murmur childhood   IBS (irritable bowel syndrome)    Joint pain    Menorrhagia    Palpitations    Plantar fasciitis    Prediabetes    Swelling    feet, legs    PAST SURGICAL HISTORY: Past Surgical History:  Procedure Laterality Date   CHOLECYSTECTOMY  2005   COLONOSCOPY WITH PROPOFOL N/A 10/27/2018   Procedure: COLONOSCOPY WITH PROPOFOL;  Surgeon: Lin Landsman, MD;  Location: [facility] END[facility];  Service: Gastroenterology;  Laterality: N/A;   DILATATION & CURETTAGE/HYSTEROSCOPY WITH MYOSURE N/A 06/23/2018   Procedure: DILATATION & CURETTAGE/HYSTEROSCOPY WITH MYOSURE;  Surgeon: Will Bonnet, MD;  Location: [facility]  ORS;  Service: Gynecology;  Laterality: N/A;   DILATION AND CURETTAGE OF UTERUS     ERCP     3 times   ESOPHAGOGASTRODUODENOSCOPY (EGD) WITH PROPOFOL  10/27/2018   Procedure: ESOPHAGOGASTRODUODENOSCOPY (EGD) WITH PROPOFOL;  Surgeon: Lin Landsman, MD;  Location: [facility] ENDOSCOPY;  Service: Gastroenterology;;   EYE SURGERY     MENISCUS REPAIR Right    x 2    SOCIAL HISTORY: Social History   Tobacco Use   Smoking status: Never Smoker   Smokeless tobacco: Never Used  Substance Use Topics   Alcohol use: Yes    Alcohol/week: 2.0 standard drinks    Types: 2 Glasses of wine per week    Comment: occasional   Drug use: No    FAMILY HISTORY: Family History  Problem Relation Age of Onset   Colon cancer Mother 52   Diabetes Mother    Hypertension Mother    Arthritis Mother    Transient ischemic attack Mother  Liver disease Mother    Obesity Mother    Diabetes Father    Heart disease Father    Esophageal cancer Maternal Grandmother    Cancer Maternal Grandmother    Diabetes Maternal Grandmother    Alcohol abuse Maternal Grandfather    Breast cancer Maternal Aunt        mat great aunt    ROS: Review of Systems  Constitutional: Positive for weight loss.  Gastrointestinal: Negative for diarrhea, nausea and vomiting.  Genitourinary: Negative for frequency.  Musculoskeletal:       Negative muscle weakness  Endo/Heme/Allergies: Negative for polydipsia.       Positive polyphagia Negative hypoglycemia    PHYSICAL EXAM: Pulse 93, temperature 98.6 F (37 C), height 5\' 8"  (1.727 m), weight 212 lb 12.8 oz (96.5 kg), last menstrual period 11/24/2018, SpO2 98 %. Body mass index is 32.36 kg/m. Physical Exam Vitals signs reviewed.  Constitutional:      Appearance: Normal appearance. She is obese.  Cardiovascular:     Rate and Rhythm: Normal rate.     Pulses: Normal pulses.  Pulmonary:     Effort: Pulmonary effort is normal.     Breath sounds:  Normal breath sounds.  Musculoskeletal: Normal range of motion.  Skin:    General: Skin is warm and dry.  Neurological:     Mental Status: She is alert and oriented to person, place, and time.  Psychiatric:        Mood and Affect: Mood normal.        Behavior: Behavior normal.     RECENT LABS AND TESTS: BMET    Component Value Date/Time   NA 138 10/05/2018 1005   K 4.7 10/05/2018 1005   CL 104 10/05/2018 1005   CO2 23 10/05/2018 1005   GLUCOSE 99 10/05/2018 1005   GLUCOSE 105 (H) 06/13/2018 1006   BUN 13 10/05/2018 1005   CREATININE 0.56 (L) 10/05/2018 1005   CALCIUM 9.4 10/05/2018 1005   GFRNONAA 115 10/05/2018 1005   GFRAA 132 10/05/2018 1005   Lab Results  Component Value Date   HGBA1C 5.7 (H) 10/05/2018   HGBA1C 5.9 06/22/2018   HGBA1C 5.8 06/21/2017   HGBA1C 5.8 08/31/2016   Lab Results  Component Value Date   INSULIN 61.1 (H) 10/05/2018   CBC    Component Value Date/Time   WBC 5.8 10/05/2018 1005   WBC 5.4 09/22/2017 0957   RBC 4.53 10/05/2018 1005   RBC 4.32 09/22/2017 0957   HGB 11.5 10/05/2018 1005   HCT 37.0 10/05/2018 1005   PLT 182 04/29/2018 1007   MCV 82 10/05/2018 1005   MCH 25.4 (L) 10/05/2018 1005   MCHC 31.1 (L) 10/05/2018 1005   MCHC 34.0 09/22/2017 0957   RDW 15.8 (H) 10/05/2018 1005   LYMPHSABS 1.6 10/05/2018 1005   EOSABS 0.2 10/05/2018 1005   BASOSABS 0.0 10/05/2018 1005   Iron/TIBC/Ferritin/ %Sat    Component Value Date/Time   IRON 40 10/05/2018 1005   TIBC 403 10/05/2018 1005   FERRITIN 20 10/05/2018 1005   IRONPCTSAT 10 (L) 10/05/2018 1005   IRONPCTSAT 7 (L) 09/08/2016 1109   Lipid Panel     Component Value Date/Time   CHOL 156 10/05/2018 1005   TRIG 106 10/05/2018 1005   HDL 61 10/05/2018 1005   CHOLHDL 3 06/22/2018 1113   VLDL 20.6 06/22/2018 1113   LDLCALC 74 10/05/2018 1005   Hepatic Function Panel     Component Value Date/Time   PROT 7.3  10/05/2018 1005   ALBUMIN 4.5 10/05/2018 1005   AST 36 10/05/2018  1005   ALT 46 (H) 10/05/2018 1005   ALKPHOS 92 10/05/2018 1005   BILITOT 0.4 10/05/2018 1005   BILIDIR 0.1 06/22/2018 1113      Component Value Date/Time   TSH 2.020 10/05/2018 1005   TSH 1.660 04/29/2018 1007      OBESITY BEHAVIORAL INTERVENTION VISIT  Today's visit was # 5   Starting weight: 220 lbs Starting date: 10/05/2018 Today's weight : 212 lbs  Today's date: 12/06/2018 Total lbs lost to date: 8    ASK: We discussed the diagnosis of obesity with Angela [name] today and Angela [name] agreed to give [place] permission to discuss obesity behavioral modification therapy today.  ASSESS: Angela [name] has the diagnosis of obesity and her BMI today is 32.36 Angela [name] is in the action stage of change   ADVISE: Angela [name] was educated on the multiple health risks of obesity as well as the benefit of weight loss to improve her health. She was advised of the need for long term treatment and the importance of lifestyle modifications to improve her current health and to decrease her risk of future health problems.  AGREE: Multiple dietary modification options and treatment options were discussed and  Angela [name] agreed to follow the recommendations documented in the above note.  ARRANGE: Angela [name] was educated on the importance of frequent visits to treat obesity as outlined per CMS and USPSTF guidelines and agreed to schedule her next follow up appointment today.  Wilhemena Durie, am acting as transcriptionist for [name], PA-C I, [name], PA-C have reviewed above note and agree with its content

## 2018-12-14 DIAGNOSIS — H10411 Chronic giant papillary conjunctivitis, right eye: Secondary | ICD-10-CM | POA: Diagnosis not present

## 2018-12-21 ENCOUNTER — Other Ambulatory Visit: Payer: Self-pay

## 2018-12-21 ENCOUNTER — Encounter (INDEPENDENT_AMBULATORY_CARE_PROVIDER_SITE_OTHER): Payer: Self-pay | Admitting: Bariatrics

## 2018-12-21 ENCOUNTER — Ambulatory Visit (INDEPENDENT_AMBULATORY_CARE_PROVIDER_SITE_OTHER): Payer: 59 | Admitting: Bariatrics

## 2018-12-21 ENCOUNTER — Ambulatory Visit (INDEPENDENT_AMBULATORY_CARE_PROVIDER_SITE_OTHER): Payer: 59 | Admitting: Physician Assistant

## 2018-12-21 VITALS — BP 112/80 | HR 77 | Temp 98.7°F | Ht 68.0 in | Wt 210.0 lb

## 2018-12-21 DIAGNOSIS — F3289 Other specified depressive episodes: Secondary | ICD-10-CM | POA: Diagnosis not present

## 2018-12-21 DIAGNOSIS — E559 Vitamin D deficiency, unspecified: Secondary | ICD-10-CM | POA: Diagnosis not present

## 2018-12-21 DIAGNOSIS — E669 Obesity, unspecified: Secondary | ICD-10-CM | POA: Diagnosis not present

## 2018-12-21 DIAGNOSIS — Z6832 Body mass index (BMI) 32.0-32.9, adult: Secondary | ICD-10-CM

## 2018-12-21 DIAGNOSIS — Z9189 Other specified personal risk factors, not elsewhere classified: Secondary | ICD-10-CM

## 2018-12-21 MED ORDER — VITAMIN D (ERGOCALCIFEROL) 1.25 MG (50000 UNIT) PO CAPS: 50000.0000 [IU] | ORAL_CAPSULE | ORAL | 0 refills | Status: DC

## 2018-12-21 MED FILL — VIT D2 1.25 MG (50,000 UNIT: 1.25 MG | 28 days supply | Qty: 4 | Fill #0

## 2018-12-21 NOTE — Progress Notes (Signed)
Office: [phone]  /  Fax: [phone]   HPI:   Chief Complaint: OBESITY Angela [name] is here to discuss her progress with her obesity treatment plan. She is on the Category 3 plan and is following her eating plan approximately 90-95% of the time. She states she is walking/biking 20-30 minutes 3 times per week. Kamesha has lost 2 lbs since her last visit. She reports doing well with her water and protein intake.  Her weight is 210 lb (95.3 kg) today and has had a weight loss of 2 pounds over a period of 2 weeks since her last visit. She has lost 10 lbs since starting treatment with [place].  Vitamin D deficiency Angela [name] has a diagnosis of Vitamin D deficiency. She is currently taking prescription Vit D and denies nausea, vomiting or muscle weakness.  At risk for osteopenia and osteoporosis Angela [name] is at higher risk of osteopenia and osteoporosis due to vitamin D deficiency.   Depression with emotional eating behaviors Angela [name] is struggling with emotional eating and using food for comfort to the extent that it is negatively impacting her health. She often snacks when she is not hungry. Angela [name] sometimes feels she is out of control and then feels guilty that she made poor food choices. She has been working on behavior modification techniques to help reduce her emotional eating and has been somewhat successful. Angela [name] has seen Dr. Mallie Mussel. She reports less stress eating. She shows no sign of suicidal or homicidal ideations.  Depression screen [facility] 2/9 08/31/2016  Decreased Interest 0  Down, Depressed, Hopeless 0  PHQ - 2 Score 0  Altered sleeping 1  Tired, decreased energy 1  Change in appetite 1  Feeling bad or failure about yourself  0  Trouble concentrating 0  Moving slowly or fidgety/restless 0  Suicidal thoughts 0  PHQ-9 Score 3  Difficult doing work/chores Not difficult at all   ASSESSMENT AND PLAN:  Vitamin D deficiency - Plan: Vitamin D, Ergocalciferol, (DRISDOL) 1.25 MG (50000 UT) CAPS  capsule  Other depression  At risk for osteoporosis  Class 1 obesity with serious comorbidity and body mass index (BMI) of 32.0 to 32.9 in adult, unspecified obesity type  PLAN:  Vitamin D Deficiency Angela [name] was informed that low Vitamin D levels contributes to fatigue and are associated with obesity, breast, and colon cancer. She agrees to continue to take prescription Vit D @ 50,000 IU every week #4 with 0 refills and will follow-up for routine testing of Vitamin D, at least 2-3 times per year. She was informed of the risk of over-replacement of Vitamin D and agrees to not increase her dose unless she discusses this with [place] first. Angela [name] agrees to follow-up with our clinic in 2 weeks.  At risk for osteopenia and osteoporosis Angela [name] was given extended  (15 minutes) osteoporosis prevention counseling today. Angela [name] is at risk for osteopenia and osteoporosis due to her Vitamin D deficiency. She was encouraged to take her Vitamin D and follow her higher calcium diet and increase strengthening exercise to help strengthen her bones and decrease her risk of osteopenia and osteoporosis.  Depression with Emotional Eating Behaviors We discussed behavior modification techniques today to help Angela [name] deal with her emotional eating and depression. Angela [name] will follow-up with Dr. Mallie Mussel.  Obesity Angela [name] is currently in the action stage of change. As such, her goal is to continue with weight loss efforts. She has agreed to follow the Category 3 plan. Angela [name] will work on meal planning, intentional eating, and increasing  raw vegetables. Angela [name] has been instructed to continue walking or exercising on stationary bike 30 minutes 3 times per week for weight loss and overall health benefits. We discussed the following Behavioral Modification Strategies today: increasing lean protein intake, decreasing simple carbohydrates, increasing vegetables, increase H20 intake, decrease eating out, no skipping meals, work on  meal planning and easy cooking plans, keeping healthy foods in the home, and planning for success.  Angela [name] has agreed to follow-up with our clinic in 2 weeks. She was informed of the importance of frequent follow-up visits to maximize her success with intensive lifestyle modifications for her multiple health conditions.  ALLERGIES: Allergies  Allergen Reactions  . Quinoa-Kale-Hemp [Alimentum]     MEDICATIONS: Current Outpatient Medications on File Prior to Visit  Medication Sig Dispense Refill  . brimonidine-timolol (COMBIGAN) 0.2-0.5 % ophthalmic solution Place 1 drop into the right eye every 12 (twelve) hours.     . cetirizine (ZYRTEC) 10 MG tablet Take 10 mg by mouth daily as needed for allergies.    Marland Kitchen dorzolamide (TRUSOPT) 2 % ophthalmic solution Place 1 drop into the right eye 3 (three) times daily.    . ferrous sulfate 325 (65 FE) MG tablet Take 1 tablet (325 mg total) by mouth daily. (Patient taking differently: Take 325 mg by mouth every other day. ) 90 tablet 0  . fluticasone (FLONASE) 50 MCG/ACT nasal spray Place 1 spray into both nostrils 2 (two) times daily as needed for allergies.     Marland Kitchen ibuprofen (ADVIL,MOTRIN) 600 MG tablet Take 1 tablet (600 mg total) by mouth every 6 (six) hours as needed for mild pain, moderate pain or cramping. 30 tablet 0  . latanoprost (XALATAN) 0.005 % ophthalmic solution Place 1 drop into the right eye at bedtime.     . metFORMIN (GLUCOPHAGE) 500 MG tablet Take 1 tablet (500 mg total) by mouth 2 (two) times daily with a meal. 60 tablet 0  . RHOPRESSA 0.02 % SOLN      No current facility-administered medications on file prior to visit.     PAST MEDICAL HISTORY: Past Medical History:  Diagnosis Date  . Allergy Enviromental  . Anemia may/2017  . Asthma    as a child  . Back pain gerd  . Constipation   . Glaucoma   . Heart murmur childhood  . IBS (irritable bowel syndrome)   . Joint pain   . Menorrhagia   . Palpitations   . Plantar fasciitis    . Prediabetes   . Swelling    feet, legs    PAST SURGICAL HISTORY: Past Surgical History:  Procedure Laterality Date  . CHOLECYSTECTOMY  2005  . COLONOSCOPY WITH PROPOFOL N/A 10/27/2018   Procedure: COLONOSCOPY WITH PROPOFOL;  Surgeon: Lin Landsman, MD;  Location: [facility] ENDOSCOPY;  Service: Gastroenterology;  Laterality: N/A;  . DILATATION & CURETTAGE/HYSTEROSCOPY WITH MYOSURE N/A 06/23/2018   Procedure: DILATATION & CURETTAGE/HYSTEROSCOPY WITH MYOSURE;  Surgeon: Will Bonnet, MD;  Location: [facility] ORS;  Service: Gynecology;  Laterality: N/A;  . DILATION AND CURETTAGE OF UTERUS    . ERCP     3 times  . ESOPHAGOGASTRODUODENOSCOPY (EGD) WITH PROPOFOL  10/27/2018   Procedure: ESOPHAGOGASTRODUODENOSCOPY (EGD) WITH PROPOFOL;  Surgeon: Lin Landsman, MD;  Location: [facility] ENDOSCOPY;  Service: Gastroenterology;;  . EYE SURGERY    . MENISCUS REPAIR Right    x 2    SOCIAL HISTORY: Social History   Tobacco Use  . Smoking status: Never Smoker  . Smokeless tobacco:  Never Used  Substance Use Topics  . Alcohol use: Yes    Alcohol/week: 2.0 standard drinks    Types: 2 Glasses of wine per week    Comment: occasional  . Drug use: No    FAMILY HISTORY: Family History  Problem Relation Age of Onset  . Colon cancer Mother 48  . Diabetes Mother   . Hypertension Mother   . Arthritis Mother   . Transient ischemic attack Mother   . Liver disease Mother   . Obesity Mother   . Diabetes Father   . Heart disease Father   . Esophageal cancer Maternal Grandmother   . Cancer Maternal Grandmother   . Diabetes Maternal Grandmother   . Alcohol abuse Maternal Grandfather   . Breast cancer Maternal Aunt        mat great aunt   ROS: Review of Systems  Gastrointestinal: Negative for nausea and vomiting.  Musculoskeletal:       Negative for muscle weakness.  Psychiatric/Behavioral: Positive for depression. Negative for suicidal ideas.       Negative for homicidal ideas.   PHYSICAL  EXAM: Blood pressure 112/80, pulse 77, temperature 98.7 F (37.1 C), temperature source Oral, height 5\' 8"  (1.727 m), weight 210 lb (95.3 kg), last menstrual period 11/24/2018, SpO2 98 %. Body mass index is 31.93 kg/m. Physical Exam Vitals signs reviewed.  Constitutional:      Appearance: Normal appearance. She is obese.  Cardiovascular:     Rate and Rhythm: Normal rate.     Pulses: Normal pulses.  Pulmonary:     Effort: Pulmonary effort is normal.     Breath sounds: Normal breath sounds.  Musculoskeletal: Normal range of motion.  Skin:    General: Skin is warm and dry.  Neurological:     Mental Status: She is alert and oriented to person, place, and time.  Psychiatric:        Behavior: Behavior normal.   RECENT LABS AND TESTS: BMET    Component Value Date/Time   NA 138 10/05/2018 1005   K 4.7 10/05/2018 1005   CL 104 10/05/2018 1005   CO2 23 10/05/2018 1005   GLUCOSE 99 10/05/2018 1005   GLUCOSE 105 (H) 06/13/2018 1006   BUN 13 10/05/2018 1005   CREATININE 0.56 (L) 10/05/2018 1005   CALCIUM 9.4 10/05/2018 1005   GFRNONAA 115 10/05/2018 1005   GFRAA 132 10/05/2018 1005   Lab Results  Component Value Date   HGBA1C 5.7 (H) 10/05/2018   HGBA1C 5.9 06/22/2018   HGBA1C 5.8 06/21/2017   HGBA1C 5.8 08/31/2016   Lab Results  Component Value Date   INSULIN 61.1 (H) 10/05/2018   CBC    Component Value Date/Time   WBC 5.8 10/05/2018 1005   WBC 5.4 09/22/2017 0957   RBC 4.53 10/05/2018 1005   RBC 4.32 09/22/2017 0957   HGB 11.5 10/05/2018 1005   HCT 37.0 10/05/2018 1005   PLT 182 04/29/2018 1007   MCV 82 10/05/2018 1005   MCH 25.4 (L) 10/05/2018 1005   MCHC 31.1 (L) 10/05/2018 1005   MCHC 34.0 09/22/2017 0957   RDW 15.8 (H) 10/05/2018 1005   LYMPHSABS 1.6 10/05/2018 1005   EOSABS 0.2 10/05/2018 1005   BASOSABS 0.0 10/05/2018 1005   Iron/TIBC/Ferritin/ %Sat    Component Value Date/Time   IRON 40 10/05/2018 1005   TIBC 403 10/05/2018 1005   FERRITIN 20  10/05/2018 1005   IRONPCTSAT 10 (L) 10/05/2018 1005   IRONPCTSAT 7 (L) 09/08/2016 1109  Lipid Panel     Component Value Date/Time   CHOL 156 10/05/2018 1005   TRIG 106 10/05/2018 1005   HDL 61 10/05/2018 1005   CHOLHDL 3 06/22/2018 1113   VLDL 20.6 06/22/2018 1113   LDLCALC 74 10/05/2018 1005   Hepatic Function Panel     Component Value Date/Time   PROT 7.3 10/05/2018 1005   ALBUMIN 4.5 10/05/2018 1005   AST 36 10/05/2018 1005   ALT 46 (H) 10/05/2018 1005   ALKPHOS 92 10/05/2018 1005   BILITOT 0.4 10/05/2018 1005   BILIDIR 0.1 06/22/2018 1113      Component Value Date/Time   TSH 2.020 10/05/2018 1005   TSH 1.660 04/29/2018 1007   Results for [name], Skyllar (MRN [id number]) as of 12/21/2018 11:33  Ref. Range 10/05/2018 10:05  Vitamin D, 25-Hydroxy Latest Ref Range: 30.0 - 100.0 ng/mL 11.4 (L)   OBESITY BEHAVIORAL INTERVENTION VISIT  Today's visit was #6  Starting weight: 220 lbs Starting date: 10/05/2018 Today's weight: 210 lbs  Today's date: 12/21/2018 Total lbs lost to date: 10    12/21/2018  Height 5\' 8"  (1.727 m)  Weight 210 lb (95.3 kg)  BMI (Calculated) 31.94  BLOOD PRESSURE - SYSTOLIC [id number]  BLOOD PRESSURE - DIASTOLIC 80   Body Fat % 0000000 %  Total Body Water (lbs) 84.2 lbs   ASK: We discussed the diagnosis of obesity with Angela [name] today and Ladeidra agreed to give [place] permission to discuss obesity behavioral modification therapy today.  ASSESS: Copper has the diagnosis of obesity and her BMI today is 32.0. Velta is in the action stage of change.   ADVISE: Athalene was educated on the multiple health risks of obesity as well as the benefit of weight loss to improve her health. She was advised of the need for long term treatment and the importance of lifestyle modifications to improve her current health and to decrease her risk of future health problems.  AGREE: Multiple dietary modification options and treatment options were discussed  and  Modine agreed to follow the recommendations documented in the above note.  ARRANGE: Jasamine was educated on the importance of frequent visits to treat obesity as outlined per CMS and USPSTF guidelines and agreed to schedule her next follow up appointment today.  Migdalia Dk, am acting as Location manager for [employer], DO  I have reviewed the above documentation for accuracy and completeness, and I agree with the above. -Jearld Lesch, DO

## 2018-12-23 ENCOUNTER — Encounter (INDEPENDENT_AMBULATORY_CARE_PROVIDER_SITE_OTHER): Payer: Self-pay | Admitting: Family Medicine

## 2018-12-27 NOTE — Telephone Encounter (Signed)
Please advise pt

## 2018-12-27 NOTE — Progress Notes (Signed)
Office: [phone]  /  Fax: [phone]    Date: January 03, 2019   Appointment Start Time: 3:30pm Duration: 21 minutes Provider: Glennie Isle, Psy.D. Type of Session: Individual Therapy  Location of Patient: Car in parking lot Location of Provider: [facility] Type of Contact: Telepsychological Visit via [employer]   Session Content: Anakaren is a 43 y.o. female presenting via [place] for a follow-up appointment to address the previously established treatment goal of decreasing emotional eating. Today's appointment was a telepsychological visit, as this provider's clinic is seeing a limited number of patients for in-person visits due to COVID-19. Therapeutic services will resume to in-person appointments once deemed appropriate. Mazie expressed understanding regarding the rationale for telepsychological services, and provided verbal consent for today's appointment. Prior to proceeding with today's appointment, Harmony's physical location at the time of this appointment was obtained. Enriqueta reported she was in her car in the parking lot of [employer] and provided the names of nearby businesses. In the event of technical difficulties, Tasheema shared a phone number she could be reached at. [place] and this provider participated in today's telepsychological service. Also, Lanea denied anyone else being present in the car or on the WebEx appointment.  This provider conducted a brief check-in and verbally administered the PHQ-9 and GAD-7. Jamyra stated work has been "quiet busy," but noted everything was "stable." She indicated she continues to engage in mindfulness exercises; this was positively reinforced. Regarding eating, Kema noted, "It's better." She recalled a couple instances of emotional eating secondary to boredom and stress, but described engaging in portion control. A plan was developed to help Saima cope with emotional eating secondary to boredom and  stress using learned skills. She wrote down the following plan: focus on hydration, be prepared with snacks congruent to the meal plan, pause to ask questions when triggered to eat (e.g., Am I really hungry?; Is there something bothering me? Will I feel better if I eat?), and engaging in mindfulness exercises. Jama was receptive to today's session as evidenced by openness to sharing, responsiveness to feedback, and willingness to implement discussed strategies. She noted, "You've given me pretty good tools."  Mental Status Examination:  Appearance: neat Behavior: cooperative Mood: euthymic Affect: mood congruent Speech: normal in rate, volume, and tone Eye Contact: appropriate Psychomotor Activity: appropriate Thought Process: linear, logical, and goal directed  Content/Perceptual Disturbances: no hallucinations, delusions, bizarre thinking or behavior reported or observed and no evidence of suicidal and homicidal ideation, plan, and intent Orientation: time, person, place and purpose of appointment Cognition/Sensorium: memory, attention, language, and fund of knowledge intact  Insight: good Judgment: good  Structured Assessment Results: The Patient Health Questionnaire-9 (PHQ-9) is a self-report measure that assesses symptoms and severity of depression over the course of the last two weeks. Edie obtained a score of 0. Little interest or pleasure in doing things 0  Feeling down, depressed, or hopeless 0  Trouble falling or staying asleep, or sleeping too much 0  Feeling tired or having little energy 0  Poor appetite or overeating 0  Feeling bad about yourself --- or that you are a failure or have let yourself or your family down 0  Trouble concentrating on things, such as reading the newspaper or watching television 0  Moving or speaking so slowly that other people could have noticed? Or the opposite --- being so fidgety or restless that you have been moving around a lot more than usual  0  Thoughts that you  would be better off dead or hurting yourself in some way 0  PHQ-9 Score 0    The Generalized Anxiety Disorder-7 (GAD-7) is a brief self-report measure that assesses symptoms of anxiety over the course of the last two weeks. Olayinka obtained a score of 0. Feeling nervous, anxious, on edge 0  Not being able to stop or control worrying 0  Worrying too much about different things 0  Trouble relaxing 0  Being so restless that it's hard to sit still 0  Becoming easily annoyed or irritable 0  Feeling afraid as if something awful might happen 0  GAD-7 Score 0   Interventions:  Conducted a brief chart review Verbal administration of PHQ-9 and GAD-7 for symptom monitoring Provided empathic reflections and validation Reviewed learned skills Provided positive reinforcement Employed supportive psychotherapy interventions to facilitate reduced distress, and to improve coping skills with identified stressors  DSM-5 Diagnosis: 311 (F32.8) Other Specified Depressive Disorder, Emotional Eating Behaviors  Treatment Goal & Progress: During the initial appointment with this provider, the following treatment goal was established: decrease emotional eating. Tyshika has demonstrated progress in her goal as evidenced by increased awareness of hunger patterns and triggers for emotional eating. Denika also reported a reduction in emotional eating and continues to demonstrate willingness to engage in learned skill(s).  Plan: Today was Hopelynn's last appointment with this provider as previously planned. She acknowledged understanding that she may request a follow-up appointment with this provider in the future as long as she is still established with the clinic. No further follow-up planned by this provider.

## 2019-01-02 ENCOUNTER — Encounter (INDEPENDENT_AMBULATORY_CARE_PROVIDER_SITE_OTHER): Payer: Self-pay

## 2019-01-02 MED FILL — RHOPRESSA 0.02 % SOLN: 0.02 | 45 days supply | Qty: 3 | Fill #1

## 2019-01-02 MED FILL — COMBIGAN EYE DROPS: 0.2-0.5 | 30 days supply | Qty: 5 | Fill #8

## 2019-01-02 NOTE — Telephone Encounter (Signed)
Jyl Sarna, will you add her in at 12 pm for 01/03/19? Back spasm.

## 2019-01-03 ENCOUNTER — Other Ambulatory Visit: Payer: Self-pay

## 2019-01-03 ENCOUNTER — Ambulatory Visit (INDEPENDENT_AMBULATORY_CARE_PROVIDER_SITE_OTHER): Payer: 59 | Admitting: Psychology

## 2019-01-03 ENCOUNTER — Telehealth (INDEPENDENT_AMBULATORY_CARE_PROVIDER_SITE_OTHER): Payer: 59 | Admitting: Primary Care

## 2019-01-03 ENCOUNTER — Encounter: Payer: Self-pay | Admitting: Primary Care

## 2019-01-03 DIAGNOSIS — M62838 Other muscle spasm: Secondary | ICD-10-CM | POA: Diagnosis not present

## 2019-01-03 DIAGNOSIS — F3289 Other specified depressive episodes: Secondary | ICD-10-CM | POA: Diagnosis not present

## 2019-01-03 MED ORDER — CYCLOBENZAPRINE HCL 10 MG PO TABS: 10.0000 mg | ORAL_TABLET | Freq: Every evening | ORAL | 0 refills | Status: DC | PRN

## 2019-01-03 NOTE — Patient Instructions (Signed)
You may take the cyclobenzaprine at bedtime as needed for muscle spasm/sleep.  Continue to work on stretching your back and refrain from sitting still too long.  Please update me if no improvement as discussed.  It was a pleasure to see you today! Allie Bossier, NP-C

## 2019-01-03 NOTE — Progress Notes (Signed)
Subjective:    Patient ID: Angela [name], female    DOB: April 27, 1975, 43 y.o.   MRN: [id number]  HPI  Virtual Visit via Video Note  I connected with Angela [name] on 01/03/19 at 12:00 PM EDT by a video enabled telemedicine application and verified that I am speaking with the correct person using two identifiers.  Location: Patient: Home Provider: Office   I discussed the limitations of evaluation and management by telemedicine and the availability of in person appointments. The patient expressed understanding and agreed to proceed.  History of Present Illness:  Angela [name] is a 43 year old female with a history of muscle spasms who presents today with a chief complaint of muscle spasm.  Her discomfort is located to the mid bilateral thoracic back which began about one week ago. Her discomfort is worse at night, improved during the day. She denies injury/trauma, numbness/tingling down her extremities, weakness to lower extremities. She believes her symptoms are from sitting in her chair at work. She's tried topical medication with temporary improvement. She's done well on cyclobenzaprine in the past and would like a refill.   Observations/Objective:  Alert and oriented. Appears well, not sickly. No distress. Speaking in complete sentences.   Assessment and Plan:  Symptoms representative of muscle spasm. She has a history of this before and has done well on cyclobenzaprine. No alarm signs. Rx for cyclobenzaprine provided to use HS. Drowsiness precautions provided.  Discussed stretching, exercise, ice/heat. Follow up PRN.  Follow Up Instructions:  You may take the cyclobenzaprine at bedtime as needed for muscle spasm/sleep.  Continue to work on stretching your back and refrain from sitting still too long.  Please update me if no improvement as discussed.  It was a pleasure to see you today! Allie Bossier, NP-C     I discussed the assessment  and treatment plan with the patient. The patient was provided an opportunity to ask questions and all were answered. The patient agreed with the plan and demonstrated an understanding of the instructions.   The patient was advised to call back or seek an in-person evaluation if the symptoms worsen or if the condition fails to improve as anticipated.    Pleas Koch, NP    Review of Systems  Musculoskeletal: Positive for back pain and myalgias.  Skin: Negative for color change.  Neurological: Negative for weakness and numbness.       Past Medical History:  Diagnosis Date  . Allergy Enviromental  . Anemia may/2017  . Asthma    as a child  . Back pain gerd  . Constipation   . Glaucoma   . Heart murmur childhood  . IBS (irritable bowel syndrome)   . Joint pain   . Menorrhagia   . Palpitations   . Plantar fasciitis   . Prediabetes   . Swelling    feet, legs     Social History   Socioeconomic History  . Marital status: Married    Spouse name: Financial planner  . Number of children: 0  . Years of education: Not on file  . Highest education level: Not on file  Occupational History  . Occupation: Agricultural consultant: [employer]  . Financial resource strain: Not on file  . Food insecurity    Worry: Not on file    Inability: Not on file  . Transportation needs    Medical: Not on file    Non-medical: Not on file  Tobacco Use  . Smoking status: Never Smoker  . Smokeless tobacco: Never Used  Substance and Sexual Activity  . Alcohol use: Yes    Alcohol/week: 2.0 standard drinks    Types: 2 Glasses of wine per week    Comment: occasional  . Drug use: No  . Sexual activity: Yes    Birth control/protection: None  Lifestyle  . Physical activity    Days per week: 0 days    Minutes per session: Not on file  . Stress: Not on file  Relationships  . Social Herbalist on phone: Not on file    Gets together: Not on file    Attends  religious service: Not on file    Active member of club or organization: Not on file    Attends meetings of clubs or organizations: Not on file    Relationship status: Not on file  . Intimate partner violence    Fear of current or ex partner: Not on file    Emotionally abused: Not on file    Physically abused: Not on file    Forced sexual activity: Not on file  Other Topics Concern  . Not on file  Social History Narrative   Married.   No children.   Works in the [employer].   Enjoys watching movies, walking, reading.     Past Surgical History:  Procedure Laterality Date  . CHOLECYSTECTOMY  2005  . COLONOSCOPY WITH PROPOFOL N/A 10/27/2018   Procedure: COLONOSCOPY WITH PROPOFOL;  Surgeon: Lin Landsman, MD;  Location: [facility] ENDOSCOPY;  Service: Gastroenterology;  Laterality: N/A;  . DILATATION & CURETTAGE/HYSTEROSCOPY WITH MYOSURE N/A 06/23/2018   Procedure: DILATATION & CURETTAGE/HYSTEROSCOPY WITH MYOSURE;  Surgeon: Will Bonnet, MD;  Location: [facility] ORS;  Service: Gynecology;  Laterality: N/A;  . DILATION AND CURETTAGE OF UTERUS    . ERCP     3 times  . ESOPHAGOGASTRODUODENOSCOPY (EGD) WITH PROPOFOL  10/27/2018   Procedure: ESOPHAGOGASTRODUODENOSCOPY (EGD) WITH PROPOFOL;  Surgeon: Lin Landsman, MD;  Location: [facility] ENDOSCOPY;  Service: Gastroenterology;;  . EYE SURGERY    . MENISCUS REPAIR Right    x 2    Family History  Problem Relation Age of Onset  . Colon cancer Mother 27  . Diabetes Mother   . Hypertension Mother   . Arthritis Mother   . Transient ischemic attack Mother   . Liver disease Mother   . Obesity Mother   . Diabetes Father   . Heart disease Father   . Esophageal cancer Maternal Grandmother   . Cancer Maternal Grandmother   . Diabetes Maternal Grandmother   . Alcohol abuse Maternal Grandfather   . Breast cancer Maternal Aunt        mat great aunt    Allergies  Allergen Reactions  . Quinoa-Kale-Hemp [Alimentum]     Current  Outpatient Medications on File Prior to Visit  Medication Sig Dispense Refill  . brimonidine-timolol (COMBIGAN) 0.2-0.5 % ophthalmic solution Place 1 drop into the right eye every 12 (twelve) hours.     . cetirizine (ZYRTEC) 10 MG tablet Take 10 mg by mouth daily as needed for allergies.    Marland Kitchen dorzolamide (TRUSOPT) 2 % ophthalmic solution Place 1 drop into the right eye 3 (three) times daily.    . ferrous sulfate 325 (65 FE) MG tablet Take 1 tablet (325 mg total) by mouth daily. (Patient taking differently: Take 325 mg by mouth every other day. ) 90 tablet 0  .  fluticasone (FLONASE) 50 MCG/ACT nasal spray Place 1 spray into both nostrils 2 (two) times daily as needed for allergies.     Marland Kitchen ibuprofen (ADVIL,MOTRIN) 600 MG tablet Take 1 tablet (600 mg total) by mouth every 6 (six) hours as needed for mild pain, moderate pain or cramping. 30 tablet 0  . latanoprost (XALATAN) 0.005 % ophthalmic solution Place 1 drop into the right eye at bedtime.     . metFORMIN (GLUCOPHAGE) 500 MG tablet Take 1 tablet (500 mg total) by mouth 2 (two) times daily with a meal. 60 tablet 0  . RHOPRESSA 0.02 % SOLN     . Vitamin D, Ergocalciferol, (DRISDOL) 1.25 MG (50000 UT) CAPS capsule Take 1 capsule (50,000 Units total) by mouth every 7 (seven) days. 4 capsule 0   No current facility-administered medications on file prior to visit.     BP 136/82   Ht 5\' 8"  (1.727 m)   Wt 214 lb 8 oz (97.3 kg)   LMP 12/25/2018   BMI 32.61 kg/m    Objective:   Physical Exam  Constitutional: She is oriented to person, place, and time. She appears well-nourished.  Respiratory: Effort normal.  Musculoskeletal: Normal range of motion.       Back:  Neurological: She is alert and oriented to person, place, and time.  Psychiatric: She has a normal mood and affect.           Assessment & Plan:

## 2019-01-03 NOTE — Assessment & Plan Note (Signed)
Symptoms representative of muscle spasm. She has a history of this before and has done well on cyclobenzaprine. No alarm signs. Rx for cyclobenzaprine provided to use HS. Drowsiness precautions provided.  Discussed stretching, exercise, ice/heat. Follow up PRN.

## 2019-01-05 ENCOUNTER — Other Ambulatory Visit: Payer: Self-pay

## 2019-01-05 ENCOUNTER — Ambulatory Visit (INDEPENDENT_AMBULATORY_CARE_PROVIDER_SITE_OTHER): Payer: 59 | Admitting: Family Medicine

## 2019-01-05 ENCOUNTER — Encounter (INDEPENDENT_AMBULATORY_CARE_PROVIDER_SITE_OTHER): Payer: Self-pay | Admitting: Family Medicine

## 2019-01-05 VITALS — BP 104/69 | HR 78 | Temp 98.1°F | Ht 68.0 in | Wt 209.0 lb

## 2019-01-05 DIAGNOSIS — Z6831 Body mass index (BMI) 31.0-31.9, adult: Secondary | ICD-10-CM

## 2019-01-05 DIAGNOSIS — E669 Obesity, unspecified: Secondary | ICD-10-CM

## 2019-01-05 DIAGNOSIS — E559 Vitamin D deficiency, unspecified: Secondary | ICD-10-CM

## 2019-01-05 DIAGNOSIS — Z9189 Other specified personal risk factors, not elsewhere classified: Secondary | ICD-10-CM | POA: Diagnosis not present

## 2019-01-05 DIAGNOSIS — R7303 Prediabetes: Secondary | ICD-10-CM | POA: Diagnosis not present

## 2019-01-05 MED ORDER — METFORMIN HCL 500 MG PO TABS: 500.0000 mg | ORAL_TABLET | Freq: Two times a day (BID) | ORAL | 0 refills | Status: DC

## 2019-01-05 MED FILL — metFORMIN HCL 500 MG TABS: 500 | 30 days supply | Qty: 60 | Fill #0

## 2019-01-09 MED FILL — DORZOLAMIDE HCL 2% EYE DRP: 2 | 66 days supply | Qty: 10 | Fill #4

## 2019-01-10 NOTE — Progress Notes (Signed)
Office: [phone]  /  Fax: [phone]   HPI:   Chief Complaint: OBESITY Angela [name] is here to discuss her progress with her obesity treatment plan. She is on the Category 3 plan and is following her eating plan approximately 90 % of the time. She states she is walking and riding stationary bike for 20-30 minutes 3 times per week. Angela [name] voices the last week she has had back spasms and was given Flexeril for management. She notes some cravings for candy or cake in the past few weeks. She still enjoys the breakfast but find dinner to get monotonous. Her weight is 209 lb (94.8 kg) today and has had a weight loss of 1 pound over a period of 2 weeks since her last visit. She has lost 11 lbs since starting treatment with [place].  Pre-Diabetes Angela [name] has a diagnosis of pre-diabetes based on her elevated Hgb A1c and was informed this puts her at greater risk of developing diabetes. She is on metformin BID, and still notes occasional carbohydrate cravings. She denies GI side effects and continues to work on diet and exercise to decrease risk of diabetes. She denies hypoglycemia.  At risk for diabetes Angela [name] is at higher than average risk for developing diabetes due to her obesity and pre-diabetes. She currently denies polyuria or polydipsia.  Vitamin D Deficiency Angela [name] has a diagnosis of vitamin D deficiency. She is currently taking prescription Vit D. She notes fatigue and denies nausea, vomiting or muscle weakness.  ASSESSMENT AND PLAN:  Vitamin D deficiency  Prediabetes - Plan: metFORMIN (GLUCOPHAGE) 500 MG tablet  At risk for diabetes mellitus  Class 1 obesity with serious comorbidity and body mass index (BMI) of 31.0 to 31.9 in adult, unspecified obesity type  PLAN:  Pre-Diabetes Angela [name] will continue to work on weight loss, exercise, and decreasing simple carbohydrates in her diet to help decrease the risk of diabetes. We dicussed metformin including benefits and risks. She was informed  that eating too many simple carbohydrates or too many calories at one sitting increases the likelihood of GI side effects. Angela [name] agrees to continue taking metformin 500 mg PO BID #60 and we will refill for 1 month. Angela [name] agrees to follow up with our clinic in 2 weeks as directed to monitor her progress.  Diabetes risk counseling Angela [name] was given extended (15 minutes) diabetes prevention counseling today. She is 43 y.o. female and has risk factors for diabetes including obesity and pre-diabetes. We discussed intensive lifestyle modifications today with an emphasis on weight loss as well as increasing exercise and decreasing simple carbohydrates in her diet.  Vitamin D Deficiency Angela [name] was informed that low vitamin D levels contributes to fatigue and are associated with obesity, breast, and colon cancer. Angela [name] agrees to continue taking prescription Vit D 50,000 IU every week, no refill needed. She will follow up for routine testing of vitamin D, at least 2-3 times per year. She was informed of the risk of over-replacement of vitamin D and agrees to not increase her dose unless she discusses this with [place] first. Angela [name] agrees to follow up with our clinic in 2 weeks.  Obesity Angela [name] is currently in the action stage of change. As such, her goal is to continue with weight loss efforts She has agreed to keep a food journal with 450-600 calories and 40+ grams of protein at supper daily and follow the Category 3 plan Angela [name] has been instructed to work up to a goal of 150 minutes of combined cardio and strengthening  exercise per week or add resistance training for 10-15 minutes 2-3 times per week for weight loss and overall health benefits. We discussed the following Behavioral Modification Strategies today: increasing lean protein intake, increasing vegetables and work on meal planning and easy cooking plans, keeping healthy foods in the home, and planning for success   Angela [name] has agreed to follow up with  our clinic in 2 weeks. She was informed of the importance of frequent follow up visits to maximize her success with intensive lifestyle modifications for her multiple health conditions.  ALLERGIES: Allergies  Allergen Reactions  . Quinoa-Kale-Hemp [Alimentum]     MEDICATIONS: Current Outpatient Medications on File Prior to Visit  Medication Sig Dispense Refill  . brimonidine-timolol (COMBIGAN) 0.2-0.5 % ophthalmic solution Place 1 drop into the right eye every 12 (twelve) hours.     . cetirizine (ZYRTEC) 10 MG tablet Take 10 mg by mouth daily as needed for allergies.    . cyclobenzaprine (FLEXERIL) 10 MG tablet Take 1 tablet (10 mg total) by mouth at bedtime as needed for muscle spasms. 30 tablet 0  . dorzolamide (TRUSOPT) 2 % ophthalmic solution Place 1 drop into the right eye 3 (three) times daily.    . ferrous sulfate 325 (65 FE) MG tablet Take 1 tablet (325 mg total) by mouth daily. (Patient taking differently: Take 325 mg by mouth every other day. ) 90 tablet 0  . fluticasone (FLONASE) 50 MCG/ACT nasal spray Place 1 spray into both nostrils 2 (two) times daily as needed for allergies.     Marland Kitchen ibuprofen (ADVIL,MOTRIN) 600 MG tablet Take 1 tablet (600 mg total) by mouth every 6 (six) hours as needed for mild pain, moderate pain or cramping. 30 tablet 0  . latanoprost (XALATAN) 0.005 % ophthalmic solution Place 1 drop into the right eye at bedtime.     . RHOPRESSA 0.02 % SOLN     . Vitamin D, Ergocalciferol, (DRISDOL) 1.25 MG (50000 UT) CAPS capsule Take 1 capsule (50,000 Units total) by mouth every 7 (seven) days. 4 capsule 0   No current facility-administered medications on file prior to visit.     PAST MEDICAL HISTORY: Past Medical History:  Diagnosis Date  . Allergy Enviromental  . Anemia may/2017  . Asthma    as a child  . Back pain gerd  . Constipation   . Glaucoma   . Heart murmur childhood  . IBS (irritable bowel syndrome)   . Joint pain   . Menorrhagia   . Palpitations    . Plantar fasciitis   . Prediabetes   . Swelling    feet, legs    PAST SURGICAL HISTORY: Past Surgical History:  Procedure Laterality Date  . CHOLECYSTECTOMY  2005  . COLONOSCOPY WITH PROPOFOL N/A 10/27/2018   Procedure: COLONOSCOPY WITH PROPOFOL;  Surgeon: Lin Landsman, MD;  Location: [facility] ENDOSCOPY;  Service: Gastroenterology;  Laterality: N/A;  . DILATATION & CURETTAGE/HYSTEROSCOPY WITH MYOSURE N/A 06/23/2018   Procedure: DILATATION & CURETTAGE/HYSTEROSCOPY WITH MYOSURE;  Surgeon: Will Bonnet, MD;  Location: [facility] ORS;  Service: Gynecology;  Laterality: N/A;  . DILATION AND CURETTAGE OF UTERUS    . ERCP     3 times  . ESOPHAGOGASTRODUODENOSCOPY (EGD) WITH PROPOFOL  10/27/2018   Procedure: ESOPHAGOGASTRODUODENOSCOPY (EGD) WITH PROPOFOL;  Surgeon: Lin Landsman, MD;  Location: [facility] ENDOSCOPY;  Service: Gastroenterology;;  . EYE SURGERY    . MENISCUS REPAIR Right    x 2    SOCIAL HISTORY: Social History  Tobacco Use  . Smoking status: Never Smoker  . Smokeless tobacco: Never Used  Substance Use Topics  . Alcohol use: Yes    Alcohol/week: 2.0 standard drinks    Types: 2 Glasses of wine per week    Comment: occasional  . Drug use: No    FAMILY HISTORY: Family History  Problem Relation Age of Onset  . Colon cancer Mother 35  . Diabetes Mother   . Hypertension Mother   . Arthritis Mother   . Transient ischemic attack Mother   . Liver disease Mother   . Obesity Mother   . Diabetes Father   . Heart disease Father   . Esophageal cancer Maternal Grandmother   . Cancer Maternal Grandmother   . Diabetes Maternal Grandmother   . Alcohol abuse Maternal Grandfather   . Breast cancer Maternal Aunt        mat great aunt    ROS: Review of Systems  Constitutional: Positive for malaise/fatigue and weight loss.  Gastrointestinal: Negative for nausea and vomiting.  Genitourinary: Negative for frequency.  Musculoskeletal: Positive for back pain.        Negative muscle weakness  Endo/Heme/Allergies: Negative for polydipsia.       Negative hypoglycemia    PHYSICAL EXAM: Blood pressure 104/69, pulse 78, temperature 98.1 F (36.7 C), temperature source Oral, height 5\' 8"  (1.727 m), weight 209 lb (94.8 kg), last menstrual period 12/25/2018, SpO2 97 %. Body mass index is 31.78 kg/m. Physical Exam Vitals signs reviewed.  Constitutional:      Appearance: Normal appearance. She is obese.  Cardiovascular:     Rate and Rhythm: Normal rate.     Pulses: Normal pulses.  Pulmonary:     Effort: Pulmonary effort is normal.     Breath sounds: Normal breath sounds.  Musculoskeletal: Normal range of motion.  Skin:    General: Skin is warm and dry.  Neurological:     Mental Status: She is alert and oriented to person, place, and time.  Psychiatric:        Mood and Affect: Mood normal.        Behavior: Behavior normal.     RECENT LABS AND TESTS: BMET    Component Value Date/Time   NA 138 10/05/2018 1005   K 4.7 10/05/2018 1005   CL 104 10/05/2018 1005   CO2 23 10/05/2018 1005   GLUCOSE 99 10/05/2018 1005   GLUCOSE 105 (H) 06/13/2018 1006   BUN 13 10/05/2018 1005   CREATININE 0.56 (L) 10/05/2018 1005   CALCIUM 9.4 10/05/2018 1005   GFRNONAA 115 10/05/2018 1005   GFRAA 132 10/05/2018 1005   Lab Results  Component Value Date   HGBA1C 5.7 (H) 10/05/2018   HGBA1C 5.9 06/22/2018   HGBA1C 5.8 06/21/2017   HGBA1C 5.8 08/31/2016   Lab Results  Component Value Date   INSULIN 61.1 (H) 10/05/2018   CBC    Component Value Date/Time   WBC 5.8 10/05/2018 1005   WBC 5.4 09/22/2017 0957   RBC 4.53 10/05/2018 1005   RBC 4.32 09/22/2017 0957   HGB 11.5 10/05/2018 1005   HCT 37.0 10/05/2018 1005   PLT 182 04/29/2018 1007   MCV 82 10/05/2018 1005   MCH 25.4 (L) 10/05/2018 1005   MCHC 31.1 (L) 10/05/2018 1005   MCHC 34.0 09/22/2017 0957   RDW 15.8 (H) 10/05/2018 1005   LYMPHSABS 1.6 10/05/2018 1005   EOSABS 0.2 10/05/2018 1005    BASOSABS 0.0 10/05/2018 1005   Iron/TIBC/Ferritin/ %Sat  Component Value Date/Time   IRON 40 10/05/2018 1005   TIBC 403 10/05/2018 1005   FERRITIN 20 10/05/2018 1005   IRONPCTSAT 10 (L) 10/05/2018 1005   IRONPCTSAT 7 (L) 09/08/2016 1109   Lipid Panel     Component Value Date/Time   CHOL 156 10/05/2018 1005   TRIG 106 10/05/2018 1005   HDL 61 10/05/2018 1005   CHOLHDL 3 06/22/2018 1113   VLDL 20.6 06/22/2018 1113   LDLCALC 74 10/05/2018 1005   Hepatic Function Panel     Component Value Date/Time   PROT 7.3 10/05/2018 1005   ALBUMIN 4.5 10/05/2018 1005   AST 36 10/05/2018 1005   ALT 46 (H) 10/05/2018 1005   ALKPHOS 92 10/05/2018 1005   BILITOT 0.4 10/05/2018 1005   BILIDIR 0.1 06/22/2018 1113      Component Value Date/Time   TSH 2.020 10/05/2018 1005   TSH 1.660 04/29/2018 1007      OBESITY BEHAVIORAL INTERVENTION VISIT  Today's visit was # 7   Starting weight: 220 lbs Starting date: 10/05/2018 Today's weight : 209 lbs  Today's date: 01/05/2019 Total lbs lost to date: 11    ASK: We discussed the diagnosis of obesity with Angela [name] today and Angela [name] agreed to give [place] permission to discuss obesity behavioral modification therapy today.  ASSESS: Angela [name] has the diagnosis of obesity and her BMI today is 31.79 Angela [name] is in the action stage of change   ADVISE: Nyari was educated on the multiple health risks of obesity as well as the benefit of weight loss to improve her health. She was advised of the need for long term treatment and the importance of lifestyle modifications to improve her current health and to decrease her risk of future health problems.  AGREE: Multiple dietary modification options and treatment options were discussed and  Angela [name] agreed to follow the recommendations documented in the above note.  ARRANGE: Angela [name] was educated on the importance of frequent visits to treat obesity as outlined per CMS and USPSTF guidelines and  agreed to schedule her next follow up appointment today.  I, Trixie Dredge, am acting as transcriptionist for Ilene Qua, MD  I have reviewed the above documentation for accuracy and completeness, and I agree with the above. - Ilene Qua, MD

## 2019-01-17 ENCOUNTER — Other Ambulatory Visit: Payer: Self-pay

## 2019-01-17 ENCOUNTER — Ambulatory Visit (INDEPENDENT_AMBULATORY_CARE_PROVIDER_SITE_OTHER): Payer: 59 | Admitting: Family Medicine

## 2019-01-17 ENCOUNTER — Encounter (INDEPENDENT_AMBULATORY_CARE_PROVIDER_SITE_OTHER): Payer: Self-pay | Admitting: Family Medicine

## 2019-01-17 VITALS — BP 95/67 | HR 84 | Temp 98.2°F | Ht 68.0 in | Wt 208.0 lb

## 2019-01-17 DIAGNOSIS — E559 Vitamin D deficiency, unspecified: Secondary | ICD-10-CM | POA: Diagnosis not present

## 2019-01-17 DIAGNOSIS — Z9189 Other specified personal risk factors, not elsewhere classified: Secondary | ICD-10-CM | POA: Diagnosis not present

## 2019-01-17 DIAGNOSIS — Z6831 Body mass index (BMI) 31.0-31.9, adult: Secondary | ICD-10-CM | POA: Diagnosis not present

## 2019-01-17 DIAGNOSIS — R7303 Prediabetes: Secondary | ICD-10-CM | POA: Diagnosis not present

## 2019-01-17 DIAGNOSIS — E669 Obesity, unspecified: Secondary | ICD-10-CM | POA: Diagnosis not present

## 2019-01-17 MED ORDER — VITAMIN D (ERGOCALCIFEROL) 1.25 MG (50000 UNIT) PO CAPS: 50000.0000 [IU] | ORAL_CAPSULE | ORAL | 0 refills | Status: DC

## 2019-01-18 NOTE — Progress Notes (Signed)
Office: [phone]  /  Fax: [phone]   HPI:   Chief Complaint: OBESITY Angela [name] is here to discuss her progress with her obesity treatment plan. She is on the keep a food journal with 450-600 calories and 40+ grams of protein at supper daily follow the Category 3 plan and is following her eating plan approximately 80 % of the time. She states she is walking for 20-30 minutes 3 times per week and doing resistance with bands for 15 minutes 2 times per week. Angela [name] was off work yesterday and today. She went to [facility] for the weekend after her last appointment. Otherwise, she is working and staying low key. She didn't journal as much the first week but she is using recipes more this week.  Her weight is 208 lb (94.3 kg) today and has had a weight loss of 1 pound over a period of 2 weeks since her last visit. She has lost 12 lbs since starting treatment with [place].  Vitamin D Deficiency Angela [name] has a diagnosis of vitamin D deficiency. She is currently taking prescription Vit D. She notes fatigue and denies nausea, vomiting or muscle weakness.  At risk for osteopenia and osteoporosis Angela [name] is at higher risk of osteopenia and osteoporosis due to vitamin D deficiency.   Pre-Diabetes Angela [name] has a diagnosis of pre-diabetes based on her elevated Hgb A1c and was informed this puts her at greater risk of developing diabetes. Last Hgb A1c was of 5.7 and insulin of 61.1. She is taking metformin currently and denies GI side effects. She continues to work on diet and exercise to decrease risk of diabetes. She denies hypoglycemia.  ASSESSMENT AND PLAN:  Vitamin D deficiency - Plan: Vitamin D, Ergocalciferol, (DRISDOL) 1.25 MG (50000 UT) CAPS capsule  PLAN:  Vitamin D Deficiency Angela [name] was informed that low vitamin D levels contributes to fatigue and are associated with obesity, breast, and colon cancer. Ethelyne agrees to continue taking prescription Vit D 50,000 IU every week #4 and we will  refill for 1 month. She will follow up for routine testing of vitamin D, at least 2-3 times per year. She was informed of the risk of over-replacement of vitamin D and agrees to not increase her dose unless she discusses this with [place] first. Shandrea agrees to follow up with our clinic in 2 weeks.  At risk for osteopenia and osteoporosis Angela [name] was given extended (15 minutes) osteoporosis prevention counseling today. Angela [name] is at risk for osteopenia and osteoporsis due to her vitamin D deficiency. She was encouraged to take her vitamin D and follow her higher calcium diet and increase strengthening exercise to help strengthen her bones and decrease her risk of osteopenia and osteoporosis.  Pre-Diabetes Angela [name] will continue to work on weight loss, exercise, and decreasing simple carbohydrates in her diet to help decrease the risk of diabetes. We dicussed metformin including benefits and risks. She was informed that eating too many simple carbohydrates or too many calories at one sitting increases the likelihood of GI side effects. Angela [name] agrees to continue taking metformin, no refill needed. Angela [name] agrees to follow up with our clinic in 2 weeks as directed to monitor her progress.  Obesity Angela [name] is currently in the action stage of change. As such, her goal is to continue with weight loss efforts She has agreed to keep a food journal with 450-600 calories and 40+ grams of protein at supper daily and follow the Category 3 plan Angela [name] has been instructed to work up to a  goal of 150 minutes of combined cardio and strengthening exercise per week for weight loss and overall health benefits. We discussed the following Behavioral Modification Strategies today: increasing lean protein intake, increasing vegetables and work on meal planning and easy cooking plans, keeping healthy foods in the home, and planning for success   Angela [name] has agreed to follow up with our clinic in 2 weeks. She was informed of the  importance of frequent follow up visits to maximize her success with intensive lifestyle modifications for her multiple health conditions.  ALLERGIES: Allergies  Allergen Reactions  . Quinoa-Kale-Hemp [Alimentum]     MEDICATIONS: Current Outpatient Medications on File Prior to Visit  Medication Sig Dispense Refill  . brimonidine-timolol (COMBIGAN) 0.2-0.5 % ophthalmic solution Place 1 drop into the right eye every 12 (twelve) hours.     . cetirizine (ZYRTEC) 10 MG tablet Take 10 mg by mouth daily as needed for allergies.    . cyclobenzaprine (FLEXERIL) 10 MG tablet Take 1 tablet (10 mg total) by mouth at bedtime as needed for muscle spasms. 30 tablet 0  . dorzolamide (TRUSOPT) 2 % ophthalmic solution Place 1 drop into the right eye 3 (three) times daily.    . ferrous sulfate 325 (65 FE) MG tablet Take 1 tablet (325 mg total) by mouth daily. (Patient taking differently: Take 325 mg by mouth every other day. ) 90 tablet 0  . fluticasone (FLONASE) 50 MCG/ACT nasal spray Place 1 spray into both nostrils 2 (two) times daily as needed for allergies.     Marland Kitchen ibuprofen (ADVIL,MOTRIN) 600 MG tablet Take 1 tablet (600 mg total) by mouth every 6 (six) hours as needed for mild pain, moderate pain or cramping. 30 tablet 0  . latanoprost (XALATAN) 0.005 % ophthalmic solution Place 1 drop into the right eye at bedtime.     . metFORMIN (GLUCOPHAGE) 500 MG tablet Take 1 tablet (500 mg total) by mouth 2 (two) times daily with a meal. 60 tablet 0  . RHOPRESSA 0.02 % SOLN      No current facility-administered medications on file prior to visit.     PAST MEDICAL HISTORY: Past Medical History:  Diagnosis Date  . Allergy Enviromental  . Anemia may/2017  . Asthma    as a child  . Back pain gerd  . Constipation   . Glaucoma   . Heart murmur childhood  . IBS (irritable bowel syndrome)   . Joint pain   . Menorrhagia   . Palpitations   . Plantar fasciitis   . Prediabetes   . Swelling    feet, legs     PAST SURGICAL HISTORY: Past Surgical History:  Procedure Laterality Date  . CHOLECYSTECTOMY  2005  . COLONOSCOPY WITH PROPOFOL N/A 10/27/2018   Procedure: COLONOSCOPY WITH PROPOFOL;  Surgeon: Lin Landsman, MD;  Location: [facility] ENDOSCOPY;  Service: Gastroenterology;  Laterality: N/A;  . DILATATION & CURETTAGE/HYSTEROSCOPY WITH MYOSURE N/A 06/23/2018   Procedure: DILATATION & CURETTAGE/HYSTEROSCOPY WITH MYOSURE;  Surgeon: Will Bonnet, MD;  Location: [facility] ORS;  Service: Gynecology;  Laterality: N/A;  . DILATION AND CURETTAGE OF UTERUS    . ERCP     3 times  . ESOPHAGOGASTRODUODENOSCOPY (EGD) WITH PROPOFOL  10/27/2018   Procedure: ESOPHAGOGASTRODUODENOSCOPY (EGD) WITH PROPOFOL;  Surgeon: Lin Landsman, MD;  Location: [facility] ENDOSCOPY;  Service: Gastroenterology;;  . EYE SURGERY    . MENISCUS REPAIR Right    x 2    SOCIAL HISTORY: Social History   Tobacco Use  .  Smoking status: Never Smoker  . Smokeless tobacco: Never Used  Substance Use Topics  . Alcohol use: Yes    Alcohol/week: 2.0 standard drinks    Types: 2 Glasses of wine per week    Comment: occasional  . Drug use: No    FAMILY HISTORY: Family History  Problem Relation Age of Onset  . Colon cancer Mother 86  . Diabetes Mother   . Hypertension Mother   . Arthritis Mother   . Transient ischemic attack Mother   . Liver disease Mother   . Obesity Mother   . Diabetes Father   . Heart disease Father   . Esophageal cancer Maternal Grandmother   . Cancer Maternal Grandmother   . Diabetes Maternal Grandmother   . Alcohol abuse Maternal Grandfather   . Breast cancer Maternal Aunt        mat great aunt    ROS: Review of Systems  Constitutional: Positive for malaise/fatigue and weight loss.  Gastrointestinal: Negative for nausea and vomiting.  Musculoskeletal:       Negative muscle weakness  Endo/Heme/Allergies:       Negative hypoglycemia    PHYSICAL EXAM: Blood pressure 95/67, pulse 84, temperature  98.2 F (36.8 C), temperature source Oral, height 5\' 8"  (1.727 m), weight 208 lb (94.3 kg), last menstrual period 12/25/2018, SpO2 98 %. Body mass index is 31.63 kg/m. Physical Exam Vitals signs reviewed.  Constitutional:      Appearance: Normal appearance. She is obese.  Cardiovascular:     Rate and Rhythm: Normal rate.     Pulses: Normal pulses.  Pulmonary:     Effort: Pulmonary effort is normal.     Breath sounds: Normal breath sounds.  Musculoskeletal: Normal range of motion.  Skin:    General: Skin is warm and dry.  Neurological:     Mental Status: She is alert and oriented to person, place, and time.  Psychiatric:        Mood and Affect: Mood normal.        Behavior: Behavior normal.     RECENT LABS AND TESTS: BMET    Component Value Date/Time   NA 138 10/05/2018 1005   K 4.7 10/05/2018 1005   CL 104 10/05/2018 1005   CO2 23 10/05/2018 1005   GLUCOSE 99 10/05/2018 1005   GLUCOSE 105 (H) 06/13/2018 1006   BUN 13 10/05/2018 1005   CREATININE 0.56 (L) 10/05/2018 1005   CALCIUM 9.4 10/05/2018 1005   GFRNONAA 115 10/05/2018 1005   GFRAA 132 10/05/2018 1005   Lab Results  Component Value Date   HGBA1C 5.7 (H) 10/05/2018   HGBA1C 5.9 06/22/2018   HGBA1C 5.8 06/21/2017   HGBA1C 5.8 08/31/2016   Lab Results  Component Value Date   INSULIN 61.1 (H) 10/05/2018   CBC    Component Value Date/Time   WBC 5.8 10/05/2018 1005   WBC 5.4 09/22/2017 0957   RBC 4.53 10/05/2018 1005   RBC 4.32 09/22/2017 0957   HGB 11.5 10/05/2018 1005   HCT 37.0 10/05/2018 1005   PLT 182 04/29/2018 1007   MCV 82 10/05/2018 1005   MCH 25.4 (L) 10/05/2018 1005   MCHC 31.1 (L) 10/05/2018 1005   MCHC 34.0 09/22/2017 0957   RDW 15.8 (H) 10/05/2018 1005   LYMPHSABS 1.6 10/05/2018 1005   EOSABS 0.2 10/05/2018 1005   BASOSABS 0.0 10/05/2018 1005   Iron/TIBC/Ferritin/ %Sat    Component Value Date/Time   IRON 40 10/05/2018 1005   TIBC 403 10/05/2018  1005   FERRITIN 20 10/05/2018  1005   IRONPCTSAT 10 (L) 10/05/2018 1005   IRONPCTSAT 7 (L) 09/08/2016 1109   Lipid Panel     Component Value Date/Time   CHOL 156 10/05/2018 1005   TRIG 106 10/05/2018 1005   HDL 61 10/05/2018 1005   CHOLHDL 3 06/22/2018 1113   VLDL 20.6 06/22/2018 1113   LDLCALC 74 10/05/2018 1005   Hepatic Function Panel     Component Value Date/Time   PROT 7.3 10/05/2018 1005   ALBUMIN 4.5 10/05/2018 1005   AST 36 10/05/2018 1005   ALT 46 (H) 10/05/2018 1005   ALKPHOS 92 10/05/2018 1005   BILITOT 0.4 10/05/2018 1005   BILIDIR 0.1 06/22/2018 1113      Component Value Date/Time   TSH 2.020 10/05/2018 1005   TSH 1.660 04/29/2018 1007      OBESITY BEHAVIORAL INTERVENTION VISIT  Today's visit was # 8   Starting weight: 220 lbs Starting date: 10/05/2018 Today's weight : 208 lbs Today's date: 01/17/2019 Total lbs lost to date: 12    ASK: We discussed the diagnosis of obesity with Angela [name] today and Angela [name] agreed to give [place] permission to discuss obesity behavioral modification therapy today.  ASSESS: Angela [name] has the diagnosis of obesity and her BMI today is 31.63 Angela [name] is in the action stage of change   ADVISE: Angela [name] was educated on the multiple health risks of obesity as well as the benefit of weight loss to improve her health. She was advised of the need for long term treatment and the importance of lifestyle modifications to improve her current health and to decrease her risk of future health problems.  AGREE: Multiple dietary modification options and treatment options were discussed and  Angela [name] agreed to follow the recommendations documented in the above note.  ARRANGE: Angela [name] was educated on the importance of frequent visits to treat obesity as outlined per CMS and USPSTF guidelines and agreed to schedule her next follow up appointment today.  I, Angela [name], am acting as transcriptionist for Ilene Qua, MD  I have reviewed the above  documentation for accuracy and completeness, and I agree with the above. - Ilene Qua, MD

## 2019-01-21 MED FILL — LATANOPROST 0.005% OPTH SOL: 0.005 | 50 days supply | Qty: 3 | Fill #1

## 2019-02-02 ENCOUNTER — Other Ambulatory Visit: Payer: Self-pay

## 2019-02-02 ENCOUNTER — Encounter (INDEPENDENT_AMBULATORY_CARE_PROVIDER_SITE_OTHER): Payer: Self-pay | Admitting: Family Medicine

## 2019-02-02 ENCOUNTER — Ambulatory Visit (INDEPENDENT_AMBULATORY_CARE_PROVIDER_SITE_OTHER): Payer: 59 | Admitting: Family Medicine

## 2019-02-02 VITALS — BP 111/65 | HR 85 | Temp 98.0°F | Ht 68.0 in | Wt 208.0 lb

## 2019-02-02 DIAGNOSIS — Z9189 Other specified personal risk factors, not elsewhere classified: Secondary | ICD-10-CM

## 2019-02-02 DIAGNOSIS — E669 Obesity, unspecified: Secondary | ICD-10-CM | POA: Diagnosis not present

## 2019-02-02 DIAGNOSIS — E559 Vitamin D deficiency, unspecified: Secondary | ICD-10-CM | POA: Diagnosis not present

## 2019-02-02 DIAGNOSIS — R7303 Prediabetes: Secondary | ICD-10-CM | POA: Diagnosis not present

## 2019-02-02 DIAGNOSIS — Z6831 Body mass index (BMI) 31.0-31.9, adult: Secondary | ICD-10-CM | POA: Diagnosis not present

## 2019-02-04 LAB — COMPREHENSIVE METABOLIC PANEL
ALT: 26 IU/L (ref 0–32)
AST: 21 IU/L (ref 0–40)
Albumin/Globulin Ratio: 1.5 (ref 1.2–2.2)
Albumin: 4.2 g/dL (ref 3.8–4.8)
Alkaline Phosphatase: 78 IU/L (ref 39–117)
BUN/Creatinine Ratio: 21 (ref 9–23)
BUN: 16 mg/dL (ref 6–24)
Bilirubin Total: 0.3 mg/dL (ref 0.0–1.2)
CO2: 21 mmol/L (ref 20–29)
Calcium: 9.3 mg/dL (ref 8.7–10.2)
Chloride: 105 mmol/L (ref 96–106)
Creatinine, Ser: 0.76 mg/dL (ref 0.57–1.00)
GFR calc Af Amer: 110 mL/min/{1.73_m2} (ref >59)
GFR calc non Af Amer: 96 mL/min/{1.73_m2} (ref >59)
Globulin, Total: 2.8 g/dL (ref 1.5–4.5)
Glucose: 88 mg/dL (ref 65–99)
Potassium: 4.5 mmol/L (ref 3.5–5.2)
Sodium: 139 mmol/L (ref 134–144)
Total Protein: 7 g/dL (ref 6.0–8.5)

## 2019-02-04 LAB — HEMOGLOBIN A1C
Est. average glucose Bld gHb Est-mCnc: 111 mg/dL
Hgb A1c MFr Bld: 5.5 % (ref 4.8–5.6)

## 2019-02-04 LAB — INSULIN, RANDOM: INSULIN: 21.9 u[IU]/mL (ref 2.6–24.9)

## 2019-02-04 LAB — VITAMIN D 25 HYDROXY (VIT D DEFICIENCY, FRACTURES): Vit D, 25-Hydroxy: 37.5 ng/mL (ref 30.0–100.0)

## 2019-02-06 NOTE — Progress Notes (Signed)
Office: [phone]  /  Fax: [phone]   HPI:   Chief Complaint: OBESITY Angela [name] is here to discuss her progress with her obesity treatment plan. She is on the keep a food journal with 450-600 calories and 40+ grams of protein at supper daily and follow the Category 3 plan and is following her eating plan approximately 85 % of the time. She states she is walking and riding the stationary bike for 20 minutes 3 times per week, and doing resistance bands for 15 minutes 2 times per week. [name] voices the past weeks have been more of the same schedule. She is staying home and working. Work has picked up in intensity. She denies hunger or cravings. She has been using snack calories to curb cravings. If she follows the meal plan for dinner she is doing 8-9 oz of meat.  Her weight is 208 lb (94.3 kg) today and has not lost weight since her last visit. She has lost 12 lbs since starting treatment with [place].  Vitamin D Deficiency Angela [name] has a diagnosis of vitamin D deficiency. She is currently taking prescription Vit D She notes fatigue and denies nausea, vomiting or muscle weakness.  Pre-Diabetes Angela [name] has a diagnosis of pre-diabetes based on her elevated Hgb A1c and was informed this puts her at greater risk of developing diabetes. She is taking metformin BID and denies GI side effects. She continues to work on diet and exercise to decrease risk of diabetes. She denies hypoglycemia.  At risk for diabetes Angela [name] is at higher than average risk for developing diabetes due to her obesity and pre-diabetes. She currently denies polyuria or polydipsia.  ASSESSMENT AND PLAN:  Vitamin D deficiency - Plan: VITAMIN D 25 Hydroxy (Vit-D Deficiency, Fractures)  Prediabetes - Plan: Hemoglobin A1c, Insulin, random, Comprehensive metabolic panel  At risk for diabetes mellitus  Class 1 obesity with serious comorbidity and body mass index (BMI) of 31.0 to 31.9 in adult, unspecified obesity type  PLAN:   Vitamin D Deficiency Angela [name] was informed that low vitamin D levels contributes to fatigue and are associated with obesity, breast, and colon cancer. Angela [name] agrees to continue taking prescription Vit D 50,000 IU every week and will follow up for routine testing of vitamin D, at least 2-3 times per year. She was informed of the risk of over-replacement of vitamin D and agrees to not increase her dose unless she discusses this with [place] first. We will check Vit D level today. Harriet agrees to follow up with our clinic in 2 weeks.  Pre-Diabetes Angela [name] will continue to work on weight loss, exercise, and decreasing simple carbohydrates in her diet to help decrease the risk of diabetes. We dicussed metformin including benefits and risks. She was informed that eating too many simple carbohydrates or too many calories at one sitting increases the likelihood of GI side effects. Angela [name] agrees to continue taking metformin, and we will check Hgb A1c, insulin, and CMP today. Angela [name] agrees to follow up with our clinic in 2 weeks as directed to monitor her progress.  Diabetes risk counseling Angela [name] was given extended (15 minutes) diabetes prevention counseling today. She is 43 y.o. female and has risk factors for diabetes including obesity and pre-diabetes. We discussed intensive lifestyle modifications today with an emphasis on weight loss as well as increasing exercise and decreasing simple carbohydrates in her diet.  Obesity Angela [name] is currently in the action stage of change. As such, her goal is to continue with weight loss efforts She has  agreed to follow the Category 3 plan Angela [name] has been instructed to work up to a goal of 150 minutes of combined cardio and strengthening exercise per week for weight loss and overall health benefits. We discussed the following Behavioral Modification Strategies today: increasing lean protein intake, increasing vegetables and work on meal planning and easy cooking plans, keeping  healthy foods in the home, and planning for success   [name] has agreed to follow up with our clinic in 2 weeks. She was informed of the importance of frequent follow up visits to maximize her success with intensive lifestyle modifications for her multiple health conditions.  ALLERGIES: Allergies  Allergen Reactions  . Quinoa-Kale-Hemp [Alimentum]     MEDICATIONS: Current Outpatient Medications on File Prior to Visit  Medication Sig Dispense Refill  . brimonidine-timolol (COMBIGAN) 0.2-0.5 % ophthalmic solution Place 1 drop into the right eye every 12 (twelve) hours.     . cetirizine (ZYRTEC) 10 MG tablet Take 10 mg by mouth daily as needed for allergies.    . cyclobenzaprine (FLEXERIL) 10 MG tablet Take 1 tablet (10 mg total) by mouth at bedtime as needed for muscle spasms. 30 tablet 0  . dorzolamide (TRUSOPT) 2 % ophthalmic solution Place 1 drop into the right eye 3 (three) times daily.    . ferrous sulfate 325 (65 FE) MG tablet Take 1 tablet (325 mg total) by mouth daily. (Patient taking differently: Take 325 mg by mouth every other day. ) 90 tablet 0  . fluticasone (FLONASE) 50 MCG/ACT nasal spray Place 1 spray into both nostrils 2 (two) times daily as needed for allergies.     Angela [name] ibuprofen (ADVIL,MOTRIN) 600 MG tablet Take 1 tablet (600 mg total) by mouth every 6 (six) hours as needed for mild pain, moderate pain or cramping. 30 tablet 0  . latanoprost (XALATAN) 0.005 % ophthalmic solution Place 1 drop into the right eye at bedtime.     . metFORMIN (GLUCOPHAGE) 500 MG tablet Take 1 tablet (500 mg total) by mouth 2 (two) times daily with a meal. 60 tablet 0  . RHOPRESSA 0.02 % SOLN     . Vitamin D, Ergocalciferol, (DRISDOL) 1.25 MG (50000 UT) CAPS capsule Take 1 capsule (50,000 Units total) by mouth every 7 (seven) days. 4 capsule 0   No current facility-administered medications on file prior to visit.     PAST MEDICAL HISTORY: Past Medical History:  Diagnosis Date  . Allergy  Enviromental  . Anemia may/2017  . Asthma    as a child  . Back pain gerd  . Constipation   . Glaucoma   . Heart murmur childhood  . IBS (irritable bowel syndrome)   . Joint pain   . Menorrhagia   . Palpitations   . Plantar fasciitis   . Prediabetes   . Swelling    feet, legs    PAST SURGICAL HISTORY: Past Surgical History:  Procedure Laterality Date  . CHOLECYSTECTOMY  2005  . COLONOSCOPY WITH PROPOFOL N/A 10/27/2018   Procedure: COLONOSCOPY WITH PROPOFOL;  Surgeon: Lin Landsman, MD;  Location: [facility] ENDOSCOPY;  Service: Gastroenterology;  Laterality: N/A;  . DILATATION & CURETTAGE/HYSTEROSCOPY WITH MYOSURE N/A 06/23/2018   Procedure: DILATATION & CURETTAGE/HYSTEROSCOPY WITH MYOSURE;  Surgeon: Will Bonnet, MD;  Location: [facility] ORS;  Service: Gynecology;  Laterality: N/A;  . DILATION AND CURETTAGE OF UTERUS    . ERCP     3 times  . ESOPHAGOGASTRODUODENOSCOPY (EGD) WITH PROPOFOL  10/27/2018   Procedure: ESOPHAGOGASTRODUODENOSCOPY (EGD)  WITH PROPOFOL;  Surgeon: Lin Landsman, MD;  Location: [facility] ENDOSCOPY;  Service: Gastroenterology;;  . EYE SURGERY    . MENISCUS REPAIR Right    x 2    SOCIAL HISTORY: Social History   Tobacco Use  . Smoking status: Never Smoker  . Smokeless tobacco: Never Used  Substance Use Topics  . Alcohol use: Yes    Alcohol/week: 2.0 standard drinks    Types: 2 Glasses of wine per week    Comment: occasional  . Drug use: No    FAMILY HISTORY: Family History  Problem Relation Age of Onset  . Colon cancer Mother 51  . Diabetes Mother   . Hypertension Mother   . Arthritis Mother   . Transient ischemic attack Mother   . Liver disease Mother   . Obesity Mother   . Diabetes Father   . Heart disease Father   . Esophageal cancer Maternal Grandmother   . Cancer Maternal Grandmother   . Diabetes Maternal Grandmother   . Alcohol abuse Maternal Grandfather   . Breast cancer Maternal Aunt        mat great aunt    ROS: Review of  Systems  Constitutional: Positive for malaise/fatigue. Negative for weight loss.  Gastrointestinal: Negative for nausea and vomiting.  Genitourinary: Negative for frequency.  Musculoskeletal:       Negative muscle weakness  Endo/Heme/Allergies: Negative for polydipsia.       Negative hypoglycemia    PHYSICAL EXAM: Blood pressure 111/65, pulse 85, temperature 98 F (36.7 C), temperature source Oral, height 5\' 8"  (1.727 m), weight 208 lb (94.3 kg), last menstrual period 01/26/2019, SpO2 96 %. Body mass index is 31.63 kg/m. Physical Exam Vitals signs reviewed.  Constitutional:      Appearance: Normal appearance. She is obese.  Cardiovascular:     Rate and Rhythm: Normal rate.     Pulses: Normal pulses.  Pulmonary:     Effort: Pulmonary effort is normal.     Breath sounds: Normal breath sounds.  Musculoskeletal: Normal range of motion.  Skin:    General: Skin is warm and dry.  Neurological:     Mental Status: She is alert and oriented to person, place, and time.  Psychiatric:        Mood and Affect: Mood normal.        Behavior: Behavior normal.     RECENT LABS AND TESTS: BMET    Component Value Date/Time   NA 139 02/02/2019 1015   K 4.5 02/02/2019 1015   CL 105 02/02/2019 1015   CO2 21 02/02/2019 1015   GLUCOSE 88 02/02/2019 1015   GLUCOSE 105 (H) 06/13/2018 1006   BUN 16 02/02/2019 1015   CREATININE 0.76 02/02/2019 1015   CALCIUM 9.3 02/02/2019 1015   GFRNONAA 96 02/02/2019 1015   GFRAA 110 02/02/2019 1015   Lab Results  Component Value Date   HGBA1C 5.5 02/02/2019   HGBA1C 5.7 (H) 10/05/2018   HGBA1C 5.9 06/22/2018   HGBA1C 5.8 06/21/2017   HGBA1C 5.8 08/31/2016   Lab Results  Component Value Date   INSULIN 21.9 02/02/2019   INSULIN 61.1 (H) 10/05/2018   CBC    Component Value Date/Time   WBC 5.8 10/05/2018 1005   WBC 5.4 09/22/2017 0957   RBC 4.53 10/05/2018 1005   RBC 4.32 09/22/2017 0957   HGB 11.5 10/05/2018 1005   HCT 37.0 10/05/2018 1005    PLT 182 04/29/2018 1007   MCV 82 10/05/2018 1005   MCH  25.4 (L) 10/05/2018 1005   MCHC 31.1 (L) 10/05/2018 1005   MCHC 34.0 09/22/2017 0957   RDW 15.8 (H) 10/05/2018 1005   LYMPHSABS 1.6 10/05/2018 1005   EOSABS 0.2 10/05/2018 1005   BASOSABS 0.0 10/05/2018 1005   Iron/TIBC/Ferritin/ %Sat    Component Value Date/Time   IRON 40 10/05/2018 1005   TIBC 403 10/05/2018 1005   FERRITIN 20 10/05/2018 1005   IRONPCTSAT 10 (L) 10/05/2018 1005   IRONPCTSAT 7 (L) 09/08/2016 1109   Lipid Panel     Component Value Date/Time   CHOL 156 10/05/2018 1005   TRIG 106 10/05/2018 1005   HDL 61 10/05/2018 1005   CHOLHDL 3 06/22/2018 1113   VLDL 20.6 06/22/2018 1113   LDLCALC 74 10/05/2018 1005   Hepatic Function Panel     Component Value Date/Time   PROT 7.0 02/02/2019 1015   ALBUMIN 4.2 02/02/2019 1015   AST 21 02/02/2019 1015   ALT 26 02/02/2019 1015   ALKPHOS 78 02/02/2019 1015   BILITOT 0.3 02/02/2019 1015   BILIDIR 0.1 06/22/2018 1113      Component Value Date/Time   TSH 2.020 10/05/2018 1005   TSH 1.660 04/29/2018 1007      OBESITY BEHAVIORAL INTERVENTION VISIT  Today's visit was # 9   Starting weight: 220 lbs Starting date: 10/05/2018 Today's weight : 208 lbs Today's date: 02/02/2019 Total lbs lost to date: 12    ASK: We discussed the diagnosis of obesity with Mehak Rodriguez-Guzman today and Kellis agreed to give [place] permission to discuss obesity behavioral modification therapy today.  ASSESS: Gavriella has the diagnosis of obesity and her BMI today is 31.63 Bayyinah is in the action stage of change   ADVISE: Shailynn was educated on the multiple health risks of obesity as well as the benefit of weight loss to improve her health. She was advised of the need for long term treatment and the importance of lifestyle modifications to improve her current health and to decrease her risk of future health problems.  AGREE: Multiple dietary modification options and treatment  options were discussed and  Daanya agreed to follow the recommendations documented in the above note.  ARRANGE: Kamy was educated on the importance of frequent visits to treat obesity as outlined per CMS and USPSTF guidelines and agreed to schedule her next follow up appointment today.  I, Trixie Dredge, am acting as transcriptionist for Ilene Qua, MD  I have reviewed the above documentation for accuracy and completeness, and I agree with the above. - Ilene Qua, MD

## 2019-02-09 ENCOUNTER — Encounter (INDEPENDENT_AMBULATORY_CARE_PROVIDER_SITE_OTHER): Payer: Self-pay | Admitting: Family Medicine

## 2019-02-09 NOTE — Telephone Encounter (Signed)
Please advise 

## 2019-02-15 ENCOUNTER — Other Ambulatory Visit (INDEPENDENT_AMBULATORY_CARE_PROVIDER_SITE_OTHER): Payer: Self-pay | Admitting: Family Medicine

## 2019-02-15 DIAGNOSIS — R7303 Prediabetes: Secondary | ICD-10-CM

## 2019-02-15 DIAGNOSIS — E559 Vitamin D deficiency, unspecified: Secondary | ICD-10-CM

## 2019-02-16 ENCOUNTER — Encounter: Payer: Self-pay | Admitting: Family Medicine

## 2019-02-16 ENCOUNTER — Encounter (INDEPENDENT_AMBULATORY_CARE_PROVIDER_SITE_OTHER): Payer: Self-pay

## 2019-02-16 ENCOUNTER — Ambulatory Visit (INDEPENDENT_AMBULATORY_CARE_PROVIDER_SITE_OTHER): Payer: 59 | Admitting: Family Medicine

## 2019-02-16 ENCOUNTER — Other Ambulatory Visit: Payer: Self-pay

## 2019-02-16 VITALS — BP 102/70 | HR 84 | Temp 98.2°F | Ht 68.0 in | Wt 211.0 lb

## 2019-02-16 DIAGNOSIS — H4031X3 Glaucoma secondary to eye trauma, right eye, severe stage: Secondary | ICD-10-CM | POA: Diagnosis not present

## 2019-02-16 DIAGNOSIS — M546 Pain in thoracic spine: Secondary | ICD-10-CM | POA: Diagnosis not present

## 2019-02-16 LAB — POC URINALSYSI DIPSTICK (AUTOMATED)
Bilirubin, UA: NEGATIVE
Blood, UA: NEGATIVE
Glucose, UA: NEGATIVE
Leukocytes, UA: NEGATIVE
Nitrite, UA: NEGATIVE
Protein, UA: POSITIVE — AB
Spec Grav, UA: >=1.03 — AB (ref 1.010–1.025)
Urobilinogen, UA: 0.2 E.U./dL
pH, UA: 5.5 (ref 5.0–8.0)

## 2019-02-16 MED ORDER — DICLOFENAC SODIUM 75 MG PO TBEC: 75.0000 mg | DELAYED_RELEASE_TABLET | Freq: Two times a day (BID) | ORAL | 0 refills | Status: DC

## 2019-02-16 MED ORDER — METFORMIN HCL 500 MG PO TABS: 500.0000 mg | ORAL_TABLET | Freq: Two times a day (BID) | ORAL | 0 refills | Status: DC

## 2019-02-16 MED ORDER — VITAMIN D (ERGOCALCIFEROL) 1.25 MG (50000 UNIT) PO CAPS: 50000.0000 [IU] | ORAL_CAPSULE | ORAL | 0 refills | Status: DC

## 2019-02-16 MED FILL — VIT D2 1.25 MG (50,000 UNIT: 1.25 MG | 28 days supply | Qty: 4 | Fill #0

## 2019-02-16 MED FILL — metFORMIN HCL 500 MG TABS: 500 | 30 days supply | Qty: 60 | Fill #0

## 2019-02-16 NOTE — Patient Instructions (Signed)
Start diclofenac for pain and inflammation.  We will call to set up PT for back pain.  Call if not improving as expected.

## 2019-02-16 NOTE — Assessment & Plan Note (Signed)
Most likely MSK pain. No rash suggesting shingles.  UA clear.  Treat with PT and NSAIDs. No indication for X-ray at this time.

## 2019-02-16 NOTE — Progress Notes (Signed)
Chief Complaint  Patient presents with  . Back Pain    History of Present Illness: HPI  43 year old female pt of Allie Bossier presents for back pain x 6 weeks. Right mid/lower back pain radiates to right flank and right UQ. No numbness, no weakness. No radiation of pain to leg. Pain intermittant.. 8/10.   Saw Allie Bossier through virtual visit... muscle relaxant helped  Some but pain has not gone away completely.  Now pain is worse in last  2 weeks. Pain is worse in late afternoon.   Pain and pulling going sitting to standing.  Pain with steps going down stairs.   Massuse helps minimally. ibuprofen 600 mg BID x 2 weeks.. helps a little but temporarily   No dysuria, no N, V, d.  She is sexually active, menses 3 weeks ago, regular.  HX of right hip " gets out of place" and bursitis.Marland Kitchen went to PT in past.  S/P Cholecystectomy, hx of ERCP and stent in [place].Marland Kitchen last 4-5 years ago.   2 weeks ago nml CM[facility]  COVID 19 screen No recent travel or known exposure to [place] The patient denies respiratory symptoms of COVID 19 at this time.  The importance of social distancing was discussed today.   Review of Systems  Constitutional: Negative for chills and fever.  HENT: Negative for congestion and ear pain.   Eyes: Negative for pain and redness.  Respiratory: Negative for cough and shortness of breath.   Cardiovascular: Negative for chest pain, palpitations and leg swelling.  Gastrointestinal: Negative for abdominal pain, blood in stool, constipation, diarrhea, nausea and vomiting.  Genitourinary: Negative for dysuria.  Musculoskeletal: Positive for back pain. Negative for falls and myalgias.  Skin: Negative for rash.  Neurological: Negative for dizziness.  Psychiatric/Behavioral: Negative for depression. The patient is not nervous/anxious.       Past Medical History:  Diagnosis Date  . Allergy Enviromental  . Anemia may/2017  . Asthma    as a child  . Back pain gerd  . Constipation    . Glaucoma   . Heart murmur childhood  . IBS (irritable bowel syndrome)   . Joint pain   . Menorrhagia   . Palpitations   . Plantar fasciitis   . Prediabetes   . Swelling    feet, legs    reports that she has never smoked. She has never used smokeless tobacco. She reports current alcohol use of about 2.0 standard drinks of alcohol per week. She reports that she does not use drugs.   Current Outpatient Medications:  .  brimonidine-timolol (COMBIGAN) 0.2-0.5 % ophthalmic solution, Place 1 drop into the right eye every 12 (twelve) hours. , Disp: , Rfl:  .  cetirizine (ZYRTEC) 10 MG tablet, Take 10 mg by mouth daily as needed for allergies., Disp: , Rfl:  .  cyclobenzaprine (FLEXERIL) 10 MG tablet, Take 1 tablet (10 mg total) by mouth at bedtime as needed for muscle spasms., Disp: 30 tablet, Rfl: 0 .  dorzolamide (TRUSOPT) 2 % ophthalmic solution, Place 1 drop into the right eye 3 (three) times daily., Disp: , Rfl:  .  ferrous sulfate 325 (65 FE) MG tablet, Take 1 tablet (325 mg total) by mouth daily. (Patient taking differently: Take 325 mg by mouth every other day. ), Disp: 90 tablet, Rfl: 0 .  fluticasone (FLONASE) 50 MCG/ACT nasal spray, Place 1 spray into both nostrils 2 (two) times daily as needed for allergies. , Disp: , Rfl:  .  ibuprofen (ADVIL,MOTRIN) 600 MG tablet, Take 1 tablet (600 mg total) by mouth every 6 (six) hours as needed for mild pain, moderate pain or cramping., Disp: 30 tablet, Rfl: 0 .  latanoprost (XALATAN) 0.005 % ophthalmic solution, Place 1 drop into the right eye at bedtime. , Disp: , Rfl:  .  RHOPRESSA 0.02 % SOLN, , Disp: , Rfl:  .  Vitamin D, Ergocalciferol, (DRISDOL) 1.25 MG (50000 UT) CAPS capsule, Take 1 capsule (50,000 Units total) by mouth every 7 (seven) days., Disp: 4 capsule, Rfl: 0 .  metFORMIN (GLUCOPHAGE) 500 MG tablet, Take 1 tablet (500 mg total) by mouth 2 (two) times daily with a meal., Disp: 60 tablet, Rfl: 0   Observations/Objective: Blood  pressure 102/70, pulse 84, temperature 98.2 F (36.8 C), temperature source Temporal, height 5\' 8"  (1.727 m), weight 211 lb (95.7 kg), last menstrual period 01/26/2019, SpO2 97 %.  Physical Exam Constitutional:      General: She is not in acute distress.    Appearance: Normal appearance. She is well-developed. She is not ill-appearing or toxic-appearing.  HENT:     Head: Normocephalic.     Right Ear: Hearing, tympanic membrane, ear canal and external ear normal. Tympanic membrane is not erythematous, retracted or bulging.     Left Ear: Hearing, tympanic membrane, ear canal and external ear normal. Tympanic membrane is not erythematous, retracted or bulging.     Nose: No mucosal edema or rhinorrhea.     Right Sinus: No maxillary sinus tenderness or frontal sinus tenderness.     Left Sinus: No maxillary sinus tenderness or frontal sinus tenderness.     Mouth/Throat:     Pharynx: Uvula midline.  Eyes:     General: Lids are normal. Lids are everted, no foreign bodies appreciated.     Conjunctiva/sclera: Conjunctivae normal.     Pupils: Pupils are equal, round, and reactive to light.  Neck:     Musculoskeletal: Normal range of motion and neck supple.     Thyroid: No thyroid mass or thyromegaly.     Vascular: No carotid bruit.     Trachea: Trachea normal.  Cardiovascular:     Rate and Rhythm: Normal rate and regular rhythm.     Pulses: Normal pulses.     Heart sounds: Normal heart sounds, S1 normal and S2 normal. No murmur. No friction rub. No gallop.   Pulmonary:     Effort: Pulmonary effort is normal. No tachypnea or respiratory distress.     Breath sounds: Normal breath sounds. No decreased breath sounds, wheezing, rhonchi or rales.  Abdominal:     General: Bowel sounds are normal.     Palpations: Abdomen is soft.     Tenderness: There is abdominal tenderness in the right upper quadrant. There is right CVA tenderness. There is no left CVA tenderness or guarding. Negative signs include  Murphy's sign.       Comments:  Very lagteral RUQ pain  Musculoskeletal:     Thoracic back: She exhibits tenderness. She exhibits normal range of motion and no bony tenderness.     Lumbar back: Normal. She exhibits normal range of motion, no tenderness and no bony tenderness.       Back:     Comments: circled area of ttp,, no rash  Skin:    General: Skin is warm and dry.     Findings: No rash.  Neurological:     Mental Status: She is alert.  Psychiatric:  Mood and Affect: Mood is not anxious or depressed.        Speech: Speech normal.        Behavior: Behavior normal. Behavior is cooperative.        Thought Content: Thought content normal.        Judgment: Judgment normal.      Assessment and Plan   Acute right-sided thoracic back pain  Most likely MSK pain. No rash suggesting shingles.  UA clear.  Treat with PT and NSAIDs. No indication for X-ray at this time.     Eliezer Lofts, MD

## 2019-02-20 ENCOUNTER — Other Ambulatory Visit: Payer: Self-pay

## 2019-02-20 ENCOUNTER — Encounter (INDEPENDENT_AMBULATORY_CARE_PROVIDER_SITE_OTHER): Payer: Self-pay | Admitting: Family Medicine

## 2019-02-20 ENCOUNTER — Ambulatory Visit (INDEPENDENT_AMBULATORY_CARE_PROVIDER_SITE_OTHER): Payer: 59 | Admitting: Family Medicine

## 2019-02-20 ENCOUNTER — Telehealth: Payer: Self-pay

## 2019-02-20 VITALS — BP 108/76 | HR 110 | Temp 98.9°F | Ht 68.0 in | Wt 207.0 lb

## 2019-02-20 DIAGNOSIS — R7303 Prediabetes: Secondary | ICD-10-CM | POA: Diagnosis not present

## 2019-02-20 DIAGNOSIS — E669 Obesity, unspecified: Secondary | ICD-10-CM | POA: Diagnosis not present

## 2019-02-20 DIAGNOSIS — K760 Fatty (change of) liver, not elsewhere classified: Secondary | ICD-10-CM

## 2019-02-20 DIAGNOSIS — Z6831 Body mass index (BMI) 31.0-31.9, adult: Secondary | ICD-10-CM | POA: Diagnosis not present

## 2019-02-20 NOTE — Telephone Encounter (Signed)
Patient had colonoscopy in July with Dr. Marius Ditch. Patient states in the past month she has had a lot of constipation. Made patient a appointment on 03/01/2019 with Dr. Marius Ditch in [facility]

## 2019-02-21 NOTE — Progress Notes (Signed)
Office: [phone]  /  Fax: [phone]   HPI:   Chief Complaint: OBESITY Angela [name] is here to discuss her progress with her obesity treatment plan. She is on the Category 3 plan and is following her eating plan approximately 90 % of the time. She states she is doing resistance and riding the stationary bike for 30 minutes 2 times per week. Angela [name] continues to do well with weight loss on her Category 3 plan. She has decreased exercise due to back pain. She is working on meal planning and would like to discuss labs results.  Her weight is 207 lb (93.9 kg) today and has had a weight loss of 1 pound over a period of 2 to 3 weeks since her last visit. She has lost 13 lbs since starting treatment with [place].  Pre-Diabetes Angela [name] has a diagnosis of pre-diabetes based on her elevated Hgb A1c and was informed this puts her at greater risk of developing diabetes. I discussed her recent lab results with the patient, and congratulated her on improved A1c/glucose and insulin. She is taking metformin currently and continues to work on diet and exercise to decrease risk of diabetes. She denies nausea, vomiting, or hypoglycemia.  Non Alcoholic Fatty Liver Disease Angela [name] has a diagnosis of NAFLD. I discussed labs with the patient, and her ALT has improved and is now in normal range due to her change in diet and weight loss.  She denies abdominal pain or jaundice. She denies excessive alcohol intake.  ASSESSMENT AND PLAN:  Prediabetes  NAFLD (nonalcoholic fatty liver disease)  Class 1 obesity with serious comorbidity and body mass index (BMI) of 31.0 to 31.9 in adult, unspecified obesity type  PLAN:  Pre-Diabetes Angela [name] will continue to work on weight loss, diet, exercise, and decreasing simple carbohydrates in her diet to help decrease the risk of diabetes. We dicussed metformin including benefits and risks. She was informed that eating too many simple carbohydrates or too many calories at one sitting  increases the likelihood of GI side effects. Angela [name] agrees to continue taking metformin, and she agrees to follow up with our clinic in 2 to 3 weeks as directed to monitor her progress.  Non Alcoholic Fatty Liver Disease We discussed the likely diagnosis of non alcoholic fatty liver disease today and how this condition is obesity related. Angela [name] was educated on her risk of developing NASH or even liver failure and th only proven treatment for NAFLD was weight loss. Angela [name] agreed to continue with her weight loss efforts with healthier diet and exercise as an essential part of her treatment plan. We will recheck labs in 3 months.  I spent > than 50% of the 25 minute visit on counseling as documented in the note.  Obesity Angela [name] is currently in the action stage of change. As such, her goal is to continue with weight loss efforts She has agreed to keep a food journal with 400-550 calories and 40+ grams of protein at supper daily and follow the Category 3 plan Additional recipes were given today. Angela [name] has been instructed to work up to a goal of 150 minutes of combined cardio and strengthening exercise per week for weight loss and overall health benefits. We discussed the following Behavioral Modification Strategies today: increasing lean protein intake and decreasing simple carbohydrates    Angela [name] has agreed to follow up with our clinic in 2 to 3 weeks. She was informed of the importance of frequent follow up visits to maximize her success with intensive lifestyle  modifications for her multiple health conditions.  ALLERGIES: Allergies  Allergen Reactions   Quinoa-Kale-Hemp [Alimentum]     MEDICATIONS: Current Outpatient Medications on File Prior to Visit  Medication Sig Dispense Refill   brimonidine-timolol (COMBIGAN) 0.2-0.5 % ophthalmic solution Place 1 drop into the right eye every 12 (twelve) hours.      cetirizine (ZYRTEC) 10 MG tablet Take 10 mg by mouth daily as needed for  allergies.     cyclobenzaprine (FLEXERIL) 10 MG tablet Take 1 tablet (10 mg total) by mouth at bedtime as needed for muscle spasms. 30 tablet 0   diclofenac (VOLTAREN) 75 MG EC tablet Take 1 tablet (75 mg total) by mouth 2 (two) times daily. 30 tablet 0   dorzolamide (TRUSOPT) 2 % ophthalmic solution Place 1 drop into the right eye 3 (three) times daily.     ferrous sulfate 325 (65 FE) MG tablet Take 1 tablet (325 mg total) by mouth daily. (Patient taking differently: Take 325 mg by mouth every other day. ) 90 tablet 0   fluticasone (FLONASE) 50 MCG/ACT nasal spray Place 1 spray into both nostrils 2 (two) times daily as needed for allergies.      ibuprofen (ADVIL,MOTRIN) 600 MG tablet Take 1 tablet (600 mg total) by mouth every 6 (six) hours as needed for mild pain, moderate pain or cramping. 30 tablet 0   latanoprost (XALATAN) 0.005 % ophthalmic solution Place 1 drop into the right eye at bedtime.      metFORMIN (GLUCOPHAGE) 500 MG tablet Take 1 tablet (500 mg total) by mouth 2 (two) times daily with a meal. 60 tablet 0   RHOPRESSA 0.02 % SOLN      Vitamin D, Ergocalciferol, (DRISDOL) 1.25 MG (50000 UT) CAPS capsule Take 1 capsule (50,000 Units total) by mouth every 7 (seven) days. 4 capsule 0   No current facility-administered medications on file prior to visit.     PAST MEDICAL HISTORY: Past Medical History:  Diagnosis Date   Allergy Enviromental   Anemia may/2017   Asthma    as a child   Back pain gerd   Constipation    Glaucoma    Heart murmur childhood   IBS (irritable bowel syndrome)    Joint pain    Menorrhagia    Palpitations    Plantar fasciitis    Prediabetes    Swelling    feet, legs    PAST SURGICAL HISTORY: Past Surgical History:  Procedure Laterality Date   CHOLECYSTECTOMY  2005   COLONOSCOPY WITH PROPOFOL N/A 10/27/2018   Procedure: COLONOSCOPY WITH PROPOFOL;  Surgeon: Lin Landsman, MD;  Location: [facility];  Service:  Gastroenterology;  Laterality: N/A;   DILATATION & CURETTAGE/HYSTEROSCOPY WITH MYOSURE N/A 06/23/2018   Procedure: DILATATION & CURETTAGE/HYSTEROSCOPY WITH MYOSURE;  Surgeon: Will Bonnet, MD;  Location: [facility] ORS;  Service: Gynecology;  Laterality: N/A;   DILATION AND CURETTAGE OF UTERUS     ERCP     3 times   ESOPHAGOGASTRODUODENOSCOPY (EGD) WITH PROPOFOL  10/27/2018   Procedure: ESOPHAGOGASTRODUODENOSCOPY (EGD) WITH PROPOFOL;  Surgeon: Lin Landsman, MD;  Location: [facility] END[facility]COPY;  Service: Gastroenterology;;   EYE SURGERY     MENISCUS REPAIR Right    x 2    SOCIAL HISTORY: Social History   Tobacco Use   Smoking status: Never Smoker   Smokeless tobacco: Never Used  Substance Use Topics   Alcohol use: Yes    Alcohol/week: 2.0 standard drinks    Types: 2 Glasses of  wine per week    Comment: occasional   Drug use: No    FAMILY HISTORY: Family History  Problem Relation Age of Onset   Colon cancer Mother 26   Diabetes Mother    Hypertension Mother    Arthritis Mother    Transient ischemic attack Mother    Liver disease Mother    Obesity Mother    Diabetes Father    Heart disease Father    Esophageal cancer Maternal Grandmother    Cancer Maternal Grandmother    Diabetes Maternal Grandmother    Alcohol abuse Maternal Grandfather    Breast cancer Maternal Aunt        mat great aunt    ROS: Review of Systems  Constitutional: Positive for weight loss.  Eyes:       Negative jaundice  Gastrointestinal: Negative for abdominal pain, nausea and vomiting.  Endo/Heme/Allergies:       Negative hypoglycemia    PHYSICAL EXAM: Blood pressure 108/76, pulse (!) 110, temperature 98.9 F (37.2 C), temperature source Oral, height 5\' 8"  (1.727 m), weight 207 lb (93.9 kg), last menstrual period 01/26/2019, SpO2 99 %. Body mass index is 31.47 kg/m. Physical Exam Vitals signs reviewed.  Constitutional:      Appearance: Normal appearance. She is  obese.  Cardiovascular:     Rate and Rhythm: Normal rate.     Pulses: Normal pulses.  Pulmonary:     Effort: Pulmonary effort is normal.     Breath sounds: Normal breath sounds.  Musculoskeletal: Normal range of motion.  Skin:    General: Skin is warm and dry.  Neurological:     Mental Status: She is alert and oriented to person, place, and time.  Psychiatric:        Mood and Affect: Mood normal.        Behavior: Behavior normal.     RECENT LABS AND TESTS: BMET    Component Value Date/Time   NA 139 02/02/2019 1015   K 4.5 02/02/2019 1015   CL 105 02/02/2019 1015   CO2 21 02/02/2019 1015   GLUCOSE 88 02/02/2019 1015   GLUCOSE 105 (H) 06/13/2018 1006   BUN 16 02/02/2019 1015   CREATININE 0.76 02/02/2019 1015   CALCIUM 9.3 02/02/2019 1015   GFRNONAA 96 02/02/2019 1015   GFRAA 110 02/02/2019 1015   Lab Results  Component Value Date   HGBA1C 5.5 02/02/2019   HGBA1C 5.7 (H) 10/05/2018   HGBA1C 5.9 06/22/2018   HGBA1C 5.8 06/21/2017   HGBA1C 5.8 08/31/2016   Lab Results  Component Value Date   INSULIN 21.9 02/02/2019   INSULIN 61.1 (H) 10/05/2018   CBC    Component Value Date/Time   WBC 5.8 10/05/2018 1005   WBC 5.4 09/22/2017 0957   RBC 4.53 10/05/2018 1005   RBC 4.32 09/22/2017 0957   HGB 11.5 10/05/2018 1005   HCT 37.0 10/05/2018 1005   PLT 182 04/29/2018 1007   MCV 82 10/05/2018 1005   MCH 25.4 (L) 10/05/2018 1005   MCHC 31.1 (L) 10/05/2018 1005   MCHC 34.0 09/22/2017 0957   RDW 15.8 (H) 10/05/2018 1005   LYMPHSABS 1.6 10/05/2018 1005   EOSABS 0.2 10/05/2018 1005   BASOSABS 0.0 10/05/2018 1005   Iron/TIBC/Ferritin/ %Sat    Component Value Date/Time   IRON 40 10/05/2018 1005   TIBC 403 10/05/2018 1005   FERRITIN 20 10/05/2018 1005   IRONPCTSAT 10 (L) 10/05/2018 1005   IRONPCTSAT 7 (L) 09/08/2016 1109   Lipid  Panel     Component Value Date/Time   CHOL 156 10/05/2018 1005   TRIG 106 10/05/2018 1005   HDL 61 10/05/2018 1005   CHOLHDL 3  06/22/2018 1113   VLDL 20.6 06/22/2018 1113   LDLCALC 74 10/05/2018 1005   Hepatic Function Panel     Component Value Date/Time   PROT 7.0 02/02/2019 1015   ALBUMIN 4.2 02/02/2019 1015   AST 21 02/02/2019 1015   ALT 26 02/02/2019 1015   ALKPHOS 78 02/02/2019 1015   BILITOT 0.3 02/02/2019 1015   BILIDIR 0.1 06/22/2018 1113      Component Value Date/Time   TSH 2.020 10/05/2018 1005   TSH 1.660 04/29/2018 1007      OBESITY BEHAVIORAL INTERVENTION VISIT  Today's visit was # 10   Starting weight: 220 lbs Starting date: 10/05/2018 Today's weight : 207 lbs Today's date: 02/20/2019 Total lbs lost to date: 19    ASK: We discussed the diagnosis of obesity with Angela [name] today and Angela [name] agreed to give [place] permission to discuss obesity behavioral modification therapy today.  ASSESS: Angela [name] has the diagnosis of obesity and her BMI today is 31.48 Angela [name] is in the action stage of change   ADVISE: Angela [name] was educated on the multiple health risks of obesity as well as the benefit of weight loss to improve her health. She was advised of the need for long term treatment and the importance of lifestyle modifications to improve her current health and to decrease her risk of future health problems.  AGREE: Multiple dietary modification options and treatment options were discussed and  Angela [name] agreed to follow the recommendations documented in the above note.  ARRANGE: Angela [name] was educated on the importance of frequent visits to treat obesity as outlined per CMS and USPSTF guidelines and agreed to schedule her next follow up appointment today.  I, Trixie Dredge, am acting as transcriptionist for Dennard Nip, MD I have reviewed the above documentation for accuracy and completeness, and I agree with the above. -Dennard Nip, MD

## 2019-02-23 ENCOUNTER — Other Ambulatory Visit: Payer: Self-pay

## 2019-02-23 DIAGNOSIS — M62838 Other muscle spasm: Secondary | ICD-10-CM

## 2019-02-24 MED ORDER — CYCLOBENZAPRINE HCL 10 MG PO TABS: 10.0000 mg | ORAL_TABLET | Freq: Every evening | ORAL | 0 refills | Status: DC | PRN

## 2019-02-24 MED FILL — CYCLOBENZAPRINE HCL 10 MG T: 10 | 30 days supply | Qty: 30 | Fill #0

## 2019-02-27 ENCOUNTER — Encounter: Payer: Self-pay | Admitting: Pharmacist Clinician (PhC)/ Clinical Pharmacy Specialist

## 2019-02-27 NOTE — Progress Notes (Signed)
This encounter was created in error - please disregard.

## 2019-03-01 ENCOUNTER — Other Ambulatory Visit: Payer: Self-pay

## 2019-03-01 ENCOUNTER — Encounter: Payer: Self-pay | Admitting: Gastroenterology

## 2019-03-01 ENCOUNTER — Ambulatory Visit (INDEPENDENT_AMBULATORY_CARE_PROVIDER_SITE_OTHER): Payer: 59 | Admitting: Gastroenterology

## 2019-03-01 VITALS — BP 122/79 | HR 80 | Temp 97.6°F | Resp 17 | Ht 68.0 in | Wt 212.4 lb

## 2019-03-01 DIAGNOSIS — D509 Iron deficiency anemia, unspecified: Secondary | ICD-10-CM

## 2019-03-01 DIAGNOSIS — K5909 Other constipation: Secondary | ICD-10-CM | POA: Insufficient documentation

## 2019-03-01 NOTE — Progress Notes (Signed)
[name], MD [address]  [place]  [place], [place] [address]  Main: [phone]  Fax: [phone]    Gastroenterology Consultation  Referring Provider:     Pleas Koch, NP Primary Care Physician:  Pleas Koch, NP Primary Gastroenterologist:  Dr. [name] Reason for Consultation:     Chronic constipation        HPI:   Melvin Rodriguez-Guzman is a 43 y.o. female referred by Dr. Carlis Abbott, Leticia Penna, NP  for consultation & management of chronic constipation. Patient reports that she has been experiencing irregular bowel habits for last 2 years, reports having hard stools, frequency every 5 days followed by several bowel movements.  She has joined weight loss program with [facility] and trying to eat healthy.  She is trying to incorporate more fiber in her diet.  She states MiraLAX is not working.  She takes Metamucil which does regulate her bowel movements however, she experiences right lower quadrant pain with it in 2 to 3 days after taking Metamucil.  She denies rectal bleeding.  She also has history of iron deficiency for which she was taking oral iron.  She stopped oral replacement due to worsening of constipation.  She is a Software engineer at [employer] She does not smoke or drink alcohol  NSAIDs: None  Antiplts/Anticoagulants/Anti thrombotics: None  GI Procedures:  EGD and colonoscopy 10/27/2018 - Normal duodenal bulb and second portion of the duodenum. - A few gastric polyps. Resected and retrieved. - Normal gastroesophageal junction and esophagus.  - One 5 mm polyp in the cecum, removed with mucosal resection. Resected and retrieved. Clip was placed. - One 8 mm polyp in the ascending colon, removed with a hot snare. Resected and retrieved. Clip was placed. - One 5 mm polyp in the descending colon, removed with a cold snare. Resected and retrieved. - The distal rectum and anal verge are normal on retroflexion view. - The examination  was otherwise normal. - Examined portion of the terminal ileum was normal - Mucosal resection was performed. Resection and retrieval were complete.  DIAGNOSIS:  A. STOMACH POLYP X2, FUNDUS; HOT SNARE:  - FUNDIC GLAND POLYP, 2 FRAGMENTS.  - NEGATIVE FOR DYSPLASIA AND MALIGNANCY.   B. COLON POLYP, CECUM; HOT SNARE:  - SESSILE SERRATED POLYP.  - NEGATIVE FOR DYSPLASIA AND MALIGNANCY.   C. COLON POLYP, ASCENDING; HOT SNARE:  - TUBULAR ADENOMA.  - NEGATIVE FOR HIGH-GRADE DYSPLASIA AND MALIGNANCY.   D. COLON POLYP, DESCENDING; COLD SNARE:  - TUBULAR ADENOMA.  - NEGATIVE FOR HIGH-GRADE DYSPLASIA AND MALIGNANCY.  Past Medical History:  Diagnosis Date  . Allergy Enviromental  . Anemia may/2017  . Asthma    as a child  . Back pain gerd  . Constipation   . Glaucoma   . Heart murmur childhood  . IBS (irritable bowel syndrome)   . Joint pain   . Menorrhagia   . Palpitations   . Plantar fasciitis   . Prediabetes   . Swelling    feet, legs    Past Surgical History:  Procedure Laterality Date  . CHOLECYSTECTOMY  2005  . COLONOSCOPY WITH PROPOFOL N/A 10/27/2018   Procedure: COLONOSCOPY WITH PROPOFOL;  Surgeon: Lin Landsman, MD;  Location: [facility] ENDOSCOPY;  Service: Gastroenterology;  Laterality: N/A;  . DILATATION & CURETTAGE/HYSTEROSCOPY WITH MYOSURE N/A 06/23/2018   Procedure: DILATATION & CURETTAGE/HYSTEROSCOPY WITH MYOSURE;  Surgeon: Will Bonnet, MD;  Location: [facility] ORS;  Service: Gynecology;  Laterality:  N/A;  . DILATION AND CURETTAGE OF UTERUS    . ERCP     3 times  . ESOPHAGOGASTRODUODENOSCOPY (EGD) WITH PROPOFOL  10/27/2018   Procedure: ESOPHAGOGASTRODUODENOSCOPY (EGD) WITH PROPOFOL;  Surgeon: Lin Landsman, MD;  Location: [facility] ENDOSCOPY;  Service: Gastroenterology;;  . EYE SURGERY    . MENISCUS REPAIR Right    x 2    Current Outpatient Medications:  .  brimonidine-timolol (COMBIGAN) 0.2-0.5 % ophthalmic solution, Place 1 drop into the right eye  every 12 (twelve) hours. , Disp: , Rfl:  .  cetirizine (ZYRTEC) 10 MG tablet, Take 10 mg by mouth daily as needed for allergies., Disp: , Rfl:  .  cyclobenzaprine (FLEXERIL) 10 MG tablet, Take 1 tablet (10 mg total) by mouth at bedtime as needed for muscle spasms., Disp: 30 tablet, Rfl: 0 .  dorzolamide (TRUSOPT) 2 % ophthalmic solution, Place 1 drop into the right eye 3 (three) times daily., Disp: , Rfl:  .  ferrous sulfate 325 (65 FE) MG tablet, Take 1 tablet (325 mg total) by mouth daily. (Patient taking differently: Take 325 mg by mouth every other day. ), Disp: 90 tablet, Rfl: 0 .  fluticasone (FLONASE) 50 MCG/ACT nasal spray, Place 1 spray into both nostrils 2 (two) times daily as needed for allergies. , Disp: , Rfl:  .  ibuprofen (ADVIL,MOTRIN) 600 MG tablet, Take 1 tablet (600 mg total) by mouth every 6 (six) hours as needed for mild pain, moderate pain or cramping., Disp: 30 tablet, Rfl: 0 .  latanoprost (XALATAN) 0.005 % ophthalmic solution, Place 1 drop into the right eye at bedtime. , Disp: , Rfl:  .  metFORMIN (GLUCOPHAGE) 500 MG tablet, Take 1 tablet (500 mg total) by mouth 2 (two) times daily with a meal., Disp: 60 tablet, Rfl: 0 .  psyllium (METAMUCIL) 58.6 % packet, Take 1 packet by mouth daily., Disp: , Rfl:  .  RHOPRESSA 0.02 % SOLN, , Disp: , Rfl:  .  Vitamin D, Ergocalciferol, (DRISDOL) 1.25 MG (50000 UT) CAPS capsule, Take 1 capsule (50,000 Units total) by mouth every 7 (seven) days., Disp: 4 capsule, Rfl: 0 .  diclofenac (VOLTAREN) 75 MG EC tablet, Take 1 tablet (75 mg total) by mouth 2 (two) times daily. (Patient not taking: Reported on 03/01/2019), Disp: 30 tablet, Rfl: 0    Family History  Problem Relation Age of Onset  . Colon cancer Mother 55  . Diabetes Mother   . Hypertension Mother   . Arthritis Mother   . Transient ischemic attack Mother   . Liver disease Mother   . Obesity Mother   . Diabetes Father   . Heart disease Father   . Esophageal cancer Maternal  Grandmother   . Cancer Maternal Grandmother   . Diabetes Maternal Grandmother   . Alcohol abuse Maternal Grandfather   . Breast cancer Maternal Aunt        mat great aunt     Social History   Tobacco Use  . Smoking status: Never Smoker  . Smokeless tobacco: Never Used  Substance Use Topics  . Alcohol use: Yes    Alcohol/week: 2.0 standard drinks    Types: 2 Glasses of wine per week    Comment: occasional  . Drug use: No    Allergies as of 03/01/2019 - Review Complete 03/01/2019  Allergen Reaction Noted  . Quinoa-kale-hemp [alimentum]  10/05/2018    Review of Systems:    All systems reviewed and negative except where noted in HPI.  Physical Exam:  BP 122/79 (BP Location: Left Arm, Patient Position: Sitting, Cuff Size: Large)   Pulse 80   Temp 97.6 F (36.4 C)   Resp 17   Ht 5\' 8"  (1.727 m)   Wt 212 lb 6.4 oz (96.3 kg)   BMI 32.30 kg/m  No LMP recorded.  General:   Alert,  Well-developed, well-nourished, pleasant and cooperative in NAD Head:  Normocephalic and atraumatic. Eyes:  Sclera clear, no icterus.   Conjunctiva pink. Ears:  Normal auditory acuity. Nose:  No deformity, discharge, or lesions. Mouth:  No deformity or lesions,oropharynx pink & moist. Neck:  Supple; no masses or thyromegaly. Lungs:  Respirations even and unlabored.  Clear throughout to auscultation.   No wheezes, crackles, or rhonchi. No acute distress. Heart:  Regular rate and rhythm; no murmurs, clicks, rubs, or gallops. Abdomen:  Normal bowel sounds. Soft, non-tender and non-distended without masses, hepatosplenomegaly or hernias noted.  No guarding or rebound tenderness.   Rectal: Not performed Msk:  Symmetrical without gross deformities. Good, equal movement & strength bilaterally. Pulses:  Normal pulses noted. Extremities:  No clubbing or edema.  No cyanosis. Neurologic:  Alert and oriented x3;  grossly normal neurologically. Skin:  Intact without significant lesions or rashes. No  jaundice. Psych:  Alert and cooperative. Normal mood and affect.  Imaging Studies: Reviewed  Assessment and Plan:   Zuley Rodriguez-Guzman is a 43 y.o. female with obesity, prediabetes on Metformin seen in consultation for iron deficiency anemia and chronic constipation.  Chronic constipation TSH is normal Recommend to incorporate more fiber in her diet, information provided Trial of Linzess 72 MCG daily, samples provided  Iron deficiency anemia EGD and colonoscopy are unremarkable, normal terminal ileum Recheck CBC, iron studies today, replace iron with fusion plus if needed   Follow up in 3 months   [name], MD

## 2019-03-01 NOTE — Patient Instructions (Signed)

## 2019-03-02 ENCOUNTER — Encounter: Payer: Self-pay | Admitting: Gastroenterology

## 2019-03-02 ENCOUNTER — Other Ambulatory Visit: Payer: Self-pay | Admitting: Gastroenterology

## 2019-03-02 ENCOUNTER — Ambulatory Visit: Payer: 59 | Admitting: Gastroenterology

## 2019-03-02 ENCOUNTER — Other Ambulatory Visit: Payer: Self-pay

## 2019-03-02 DIAGNOSIS — D509 Iron deficiency anemia, unspecified: Secondary | ICD-10-CM

## 2019-03-02 LAB — IRON AND TIBC
Iron Saturation: 7 % — CL (ref 15–55)
Iron: 23 ug/dL — ABNORMAL LOW (ref 27–159)
Total Iron Binding Capacity: 345 ug/dL (ref 250–450)
UIBC: 322 ug/dL (ref 131–425)

## 2019-03-02 LAB — CBC
Hematocrit: 32.9 % — ABNORMAL LOW (ref 34.0–46.6)
Hemoglobin: 10.6 g/dL — ABNORMAL LOW (ref 11.1–15.9)
MCH: 26.7 pg (ref 26.6–33.0)
MCHC: 32.2 g/dL (ref 31.5–35.7)
MCV: 83 fL (ref 79–97)
Platelets: 265 10*3/uL (ref 150–450)
RBC: 3.97 x10E6/uL (ref 3.77–5.28)
RDW: 14.3 % (ref 11.7–15.4)
WBC: 6.7 10*3/uL (ref 3.4–10.8)

## 2019-03-02 LAB — FERRITIN: Ferritin: 12 ng/mL — ABNORMAL LOW (ref 15–150)

## 2019-03-02 MED ORDER — FUSION PLUS PO CAPS: 1.0000 | ORAL_CAPSULE | ORAL | 2 refills | Status: AC

## 2019-03-02 MED FILL — FUSION PLUS CAPSULE: 60 days supply | Qty: 30 | Fill #0

## 2019-03-03 DIAGNOSIS — M546 Pain in thoracic spine: Secondary | ICD-10-CM | POA: Diagnosis not present

## 2019-03-06 ENCOUNTER — Other Ambulatory Visit: Payer: Self-pay | Admitting: Gastroenterology

## 2019-03-06 DIAGNOSIS — K5909 Other constipation: Secondary | ICD-10-CM

## 2019-03-06 MED ORDER — LINACLOTIDE 145 MCG PO CAPS: 145.0000 ug | ORAL_CAPSULE | Freq: Every day | ORAL | 2 refills | Status: DC

## 2019-03-06 MED FILL — LINZESS 145 MCG CAPSULE: 145 | 30 days supply | Qty: 30 | Fill #0

## 2019-03-08 DIAGNOSIS — D509 Iron deficiency anemia, unspecified: Secondary | ICD-10-CM | POA: Diagnosis not present

## 2019-03-09 MED FILL — LATANOPROST 0.005% OPTH SOL: 0.005 | 50 days supply | Qty: 3 | Fill #2

## 2019-03-09 MED FILL — RHOPRESSA 0.02 % SOLN: 0.02 | 45 days supply | Qty: 3 | Fill #2

## 2019-03-10 LAB — CELIAC DISEASE PANEL
Endomysial IgA: NEGATIVE
IgA/Immunoglobulin A, Serum: 213 mg/dL (ref 87–352)
Transglutaminase IgA: <2 U/mL (ref 0–3)

## 2019-03-10 LAB — H. PYLORI BREATH TEST: H pylori Breath Test: NEGATIVE

## 2019-03-10 LAB — H. PYLORI ANTIBODY, IGG: H. pylori, IgG AbS: 0.47 Index Value (ref 0.00–0.79)

## 2019-03-13 ENCOUNTER — Other Ambulatory Visit: Payer: Self-pay

## 2019-03-13 ENCOUNTER — Ambulatory Visit (INDEPENDENT_AMBULATORY_CARE_PROVIDER_SITE_OTHER): Payer: 59 | Admitting: Family Medicine

## 2019-03-13 ENCOUNTER — Encounter (INDEPENDENT_AMBULATORY_CARE_PROVIDER_SITE_OTHER): Payer: Self-pay | Admitting: Family Medicine

## 2019-03-13 VITALS — BP 108/70 | HR 114 | Temp 98.4°F | Ht 68.0 in | Wt 205.0 lb

## 2019-03-13 DIAGNOSIS — E669 Obesity, unspecified: Secondary | ICD-10-CM | POA: Diagnosis not present

## 2019-03-13 DIAGNOSIS — E559 Vitamin D deficiency, unspecified: Secondary | ICD-10-CM | POA: Diagnosis not present

## 2019-03-13 DIAGNOSIS — Z6831 Body mass index (BMI) 31.0-31.9, adult: Secondary | ICD-10-CM

## 2019-03-13 DIAGNOSIS — Z9189 Other specified personal risk factors, not elsewhere classified: Secondary | ICD-10-CM | POA: Diagnosis not present

## 2019-03-13 DIAGNOSIS — R7303 Prediabetes: Secondary | ICD-10-CM | POA: Diagnosis not present

## 2019-03-13 MED ORDER — METFORMIN HCL 500 MG PO TABS: 500.0000 mg | ORAL_TABLET | Freq: Two times a day (BID) | ORAL | 0 refills | Status: DC

## 2019-03-13 MED ORDER — VITAMIN D (ERGOCALCIFEROL) 1.25 MG (50000 UNIT) PO CAPS: 50000.0000 [IU] | ORAL_CAPSULE | ORAL | 0 refills | Status: DC

## 2019-03-13 MED FILL — VIT D2 1.25 MG (50,000 UNIT: 1.25 MG | 28 days supply | Qty: 4 | Fill #0

## 2019-03-13 MED FILL — metFORMIN HCL 500 MG TABS: 500 | 30 days supply | Qty: 60 | Fill #0

## 2019-03-15 DIAGNOSIS — M546 Pain in thoracic spine: Secondary | ICD-10-CM | POA: Diagnosis not present

## 2019-03-15 NOTE — Progress Notes (Signed)
Office: [phone]  /  Fax: [phone]   HPI:   Chief Complaint: OBESITY Angela [name] is here to discuss her progress with her obesity treatment plan. She is on the keep a food journal with 400-500 calories and 40+ grams of protein at supper daily and follow the Category 3 plan and is following her eating plan approximately 80 % of the time. She states she is riding the stationary bike and hiking for 20-60 minutes 2-3 times per week. [name] continues to do well with weight loss on her plan. She notes her hunger has improved and is doing better with eating all of her food. She has questions about Thanksgiving eating strategies.  Her weight is 205 lb (93 kg) today and has had a weight loss of 2 pounds over a period of 3 weeks since her last visit. She has lost 15 lbs since starting treatment with [place].  Pre-Diabetes [name] has a diagnosis of pre-diabetes based on her elevated Hgb A1c and was informed this puts her at greater risk of developing diabetes. She notes decreased polyphagia when she increases her metformin to BID. She continues to work on diet and exercise to decrease risk of diabetes.   Vitamin D Deficiency [name] has a diagnosis of vitamin D deficiency. She is stable on prescription Vit D and denies nausea, vomiting or muscle weakness.  At risk for cardiovascular disease [name] is at a higher than average risk for cardiovascular disease due to obesity. She currently denies any chest pain.  ASSESSMENT AND PLAN:  Prediabetes - Plan: metFORMIN (GLUCOPHAGE) 500 MG tablet  Vitamin D deficiency - Plan: Vitamin D, Ergocalciferol, (DRISDOL) 1.25 MG (50000 UT) CAPS capsule  At risk for heart disease  Class 1 obesity with serious comorbidity and body mass index (BMI) of 31.0 to 31.9 in adult, unspecified obesity type  PLAN:  Pre-Diabetes [name] will continue to work on weight loss, exercise, and decreasing simple carbohydrates in her diet to help decrease the risk of diabetes. We  dicussed metformin including benefits and risks. She was informed that eating too many simple carbohydrates or too many calories at one sitting increases the likelihood of GI side effects. [name] agrees to increase metformin to 500 mg PO BID #60 with no refills. [name] agrees to follow up with our clinic in 3 weeks as directed to monitor her progress.  Vitamin D Deficiency [name] was informed that low vitamin D levels contributes to fatigue and are associated with obesity, breast, and colon cancer. [name] agrees to continue taking prescription Vit D 50,000 IU every week #4 and we will refill for 1 month. She will follow up for routine testing of vitamin D, at least 2-3 times per year. She was informed of the risk of over-replacement of vitamin D and agrees to not increase her dose unless she discusses this with [place] first. [name] agrees to follow up with our clinic in 3 weeks.  Cardiovascular risk counseling [name] was given extended (15 minutes) coronary artery disease prevention counseling today. She is 43 y.o. female and has risk factors for heart disease including obesity. We discussed intensive lifestyle modifications today with an emphasis on specific weight loss instructions and strategies. Pt was also informed of the importance of increasing exercise and decreasing saturated fats to help prevent heart disease.  Obesity [name] is currently in the action stage of change. As such, her goal is to continue with weight loss efforts She has agreed to keep a food journal with 400-550 calories and 40+ grams of  protein at supper daily and follow the Category 3 plan [name] has been instructed to work up to a goal of 150 minutes of combined cardio and strengthening exercise per week for weight loss and overall health benefits. We discussed the following Behavioral Modification Strategies today: holiday eating strategies    [name] has agreed to follow up with our clinic in 3 weeks. She was informed of the  importance of frequent follow up visits to maximize her success with intensive lifestyle modifications for her multiple health conditions.  ALLERGIES: Allergies  Allergen Reactions  . Quinoa-Kale-Hemp [Alimentum]     MEDICATIONS: Current Outpatient Medications on File Prior to Visit  Medication Sig Dispense Refill  . brimonidine-timolol (COMBIGAN) 0.2-0.5 % ophthalmic solution Place 1 drop into the right eye every 12 (twelve) hours.     . cetirizine (ZYRTEC) 10 MG tablet Take 10 mg by mouth daily as needed for allergies.    . cyclobenzaprine (FLEXERIL) 10 MG tablet Take 1 tablet (10 mg total) by mouth at bedtime as needed for muscle spasms. 30 tablet 0  . dorzolamide (TRUSOPT) 2 % ophthalmic solution Place 1 drop into the right eye 3 (three) times daily.    . fluticasone (FLONASE) 50 MCG/ACT nasal spray Place 1 spray into both nostrils 2 (two) times daily as needed for allergies.     Marland Kitchen ibuprofen (ADVIL,MOTRIN) 600 MG tablet Take 1 tablet (600 mg total) by mouth every 6 (six) hours as needed for mild pain, moderate pain or cramping. 30 tablet 0  . Iron-FA-B Cmp-C-Biot-Probiotic (FUSION PLUS) CAPS Take 1 capsule by mouth every other day. 30 capsule 2  . latanoprost (XALATAN) 0.005 % ophthalmic solution Place 1 drop into the right eye at bedtime.     Marland Kitchen linaclotide (LINZESS) 145 MCG CAPS capsule Take 1 capsule (145 mcg total) by mouth daily before breakfast. 30 capsule 2  . psyllium (METAMUCIL) 58.6 % packet Take 1 packet by mouth daily.    . RHOPRESSA 0.02 % SOLN      No current facility-administered medications on file prior to visit.     PAST MEDICAL HISTORY: Past Medical History:  Diagnosis Date  . Allergy Enviromental  . Anemia may/2017  . Asthma    as a child  . Back pain gerd  . Constipation   . Glaucoma   . Heart murmur childhood  . IBS (irritable bowel syndrome)   . Joint pain   . Menorrhagia   . Palpitations   . Plantar fasciitis   . Prediabetes   . Swelling    feet,  legs    PAST SURGICAL HISTORY: Past Surgical History:  Procedure Laterality Date  . CHOLECYSTECTOMY  2005  . COLONOSCOPY WITH PROPOFOL N/A 10/27/2018   Procedure: COLONOSCOPY WITH PROPOFOL;  Surgeon: Lin Landsman, MD;  Location: [facility] ENDOSCOPY;  Service: Gastroenterology;  Laterality: N/A;  . DILATATION & CURETTAGE/HYSTEROSCOPY WITH MYOSURE N/A 06/23/2018   Procedure: DILATATION & CURETTAGE/HYSTEROSCOPY WITH MYOSURE;  Surgeon: Will Bonnet, MD;  Location: [facility] ORS;  Service: Gynecology;  Laterality: N/A;  . DILATION AND CURETTAGE OF UTERUS    . ERCP     3 times  . ESOPHAGOGASTRODUODENOSCOPY (EGD) WITH PROPOFOL  10/27/2018   Procedure: ESOPHAGOGASTRODUODENOSCOPY (EGD) WITH PROPOFOL;  Surgeon: Lin Landsman, MD;  Location: [facility] ENDOSCOPY;  Service: Gastroenterology;;  . EYE SURGERY    . MENISCUS REPAIR Right    x 2    SOCIAL HISTORY: Social History   Tobacco Use  . Smoking status: Never Smoker  .  Smokeless tobacco: Never Used  Substance Use Topics  . Alcohol use: Yes    Alcohol/week: 2.0 standard drinks    Types: 2 Glasses of wine per week    Comment: occasional  . Drug use: No    FAMILY HISTORY: Family History  Problem Relation Age of Onset  . Colon cancer Mother 3  . Diabetes Mother   . Hypertension Mother   . Arthritis Mother   . Transient ischemic attack Mother   . Liver disease Mother   . Obesity Mother   . Diabetes Father   . Heart disease Father   . Esophageal cancer Maternal Grandmother   . Cancer Maternal Grandmother   . Diabetes Maternal Grandmother   . Alcohol abuse Maternal Grandfather   . Breast cancer Maternal Aunt        mat great aunt    ROS: Review of Systems  Constitutional: Positive for weight loss.  Cardiovascular: Negative for chest pain.  Gastrointestinal: Negative for nausea and vomiting.  Musculoskeletal:       Negative muscle weakness  Endo/Heme/Allergies:       Positive polyphagia    PHYSICAL EXAM: Blood pressure  108/70, pulse (!) 114, temperature 98.4 F (36.9 C), temperature source Oral, height 5\' 8"  (1.727 m), weight 205 lb (93 kg), last menstrual period 02/21/2019, SpO2 98 %. Body mass index is 31.17 kg/m. Physical Exam Vitals signs reviewed.  Constitutional:      Appearance: Normal appearance. She is obese.  Cardiovascular:     Rate and Rhythm: Normal rate.     Pulses: Normal pulses.  Pulmonary:     Effort: Pulmonary effort is normal.     Breath sounds: Normal breath sounds.  Musculoskeletal: Normal range of motion.  Skin:    General: Skin is warm and dry.  Neurological:     Mental Status: She is alert and oriented to person, place, and time.  Psychiatric:        Mood and Affect: Mood normal.        Behavior: Behavior normal.     RECENT LABS AND TESTS: BMET    Component Value Date/Time   NA 139 02/02/2019 1015   K 4.5 02/02/2019 1015   CL 105 02/02/2019 1015   CO2 21 02/02/2019 1015   GLUCOSE 88 02/02/2019 1015   GLUCOSE 105 (H) 06/13/2018 1006   BUN 16 02/02/2019 1015   CREATININE 0.76 02/02/2019 1015   CALCIUM 9.3 02/02/2019 1015   GFRNONAA 96 02/02/2019 1015   GFRAA 110 02/02/2019 1015   Lab Results  Component Value Date   HGBA1C 5.5 02/02/2019   HGBA1C 5.7 (H) 10/05/2018   HGBA1C 5.9 06/22/2018   HGBA1C 5.8 06/21/2017   HGBA1C 5.8 08/31/2016   Lab Results  Component Value Date   INSULIN 21.9 02/02/2019   INSULIN 61.1 (H) 10/05/2018   CBC    Component Value Date/Time   WBC 6.7 03/01/2019 1415   WBC 5.4 09/22/2017 0957   RBC 3.97 03/01/2019 1415   RBC 4.32 09/22/2017 0957   HGB 10.6 (L) 03/01/2019 1415   HCT 32.9 (L) 03/01/2019 1415   PLT 265 03/01/2019 1415   MCV 83 03/01/2019 1415   MCH 26.7 03/01/2019 1415   MCHC 32.2 03/01/2019 1415   MCHC 34.0 09/22/2017 0957   RDW 14.3 03/01/2019 1415   LYMPHSABS 1.6 10/05/2018 1005   EOSABS 0.2 10/05/2018 1005   BASOSABS 0.0 10/05/2018 1005   Iron/TIBC/Ferritin/ %Sat    Component Value Date/Time   IRON  23 (L) 03/01/2019 1415   TIBC 345 03/01/2019 1415   FERRITIN 12 (L) 03/01/2019 1415   IRONPCTSAT 7 (LL) 03/01/2019 1415   IRONPCTSAT 7 (L) 09/08/2016 1109   Lipid Panel     Component Value Date/Time   CHOL 156 10/05/2018 1005   TRIG 106 10/05/2018 1005   HDL 61 10/05/2018 1005   CHOLHDL 3 06/22/2018 1113   VLDL 20.6 06/22/2018 1113   LDLCALC 74 10/05/2018 1005   Hepatic Function Panel     Component Value Date/Time   PROT 7.0 02/02/2019 1015   ALBUMIN 4.2 02/02/2019 1015   AST 21 02/02/2019 1015   ALT 26 02/02/2019 1015   ALKPHOS 78 02/02/2019 1015   BILITOT 0.3 02/02/2019 1015   BILIDIR 0.1 06/22/2018 1113      Component Value Date/Time   TSH 2.020 10/05/2018 1005   TSH 1.660 04/29/2018 1007      OBESITY BEHAVIORAL INTERVENTION VISIT  Today's visit was # 11   Starting weight: 220 lbs Starting date: 10/05/2018 Today's weight : 205 lbs Today's date: 03/13/2019 Total lbs lost to date: 15    ASK: We discussed the diagnosis of obesity with Nyliah Rodriguez-Guzman today and Clydine agreed to give [place] permission to discuss obesity behavioral modification therapy today.  ASSESS: Alayshia has the diagnosis of obesity and her BMI today is 31.18 Fritzi is in the action stage of change   ADVISE: Raychell was educated on the multiple health risks of obesity as well as the benefit of weight loss to improve her health. She was advised of the need for long term treatment and the importance of lifestyle modifications to improve her current health and to decrease her risk of future health problems.  AGREE: Multiple dietary modification options and treatment options were discussed and  Yuvia agreed to follow the recommendations documented in the above note.  ARRANGE: Anneth was educated on the importance of frequent visits to treat obesity as outlined per CMS and USPSTF guidelines and agreed to schedule her next follow up appointment today.  I, Trixie Dredge, am acting as  transcriptionist for Dennard Nip, MD  I have reviewed the above documentation for accuracy and completeness, and I agree with the above. -Dennard Nip, MD

## 2019-03-21 MED FILL — DORZOLAMIDE HCL 2% EYE DRP: 2 | 66 days supply | Qty: 10 | Fill #5

## 2019-03-22 DIAGNOSIS — M546 Pain in thoracic spine: Secondary | ICD-10-CM | POA: Diagnosis not present

## 2019-03-22 MED FILL — COMBIGAN EYE DROPS: 0.2-0.5 | 50 days supply | Qty: 5 | Fill #0

## 2019-04-03 ENCOUNTER — Other Ambulatory Visit: Payer: Self-pay

## 2019-04-03 ENCOUNTER — Ambulatory Visit (INDEPENDENT_AMBULATORY_CARE_PROVIDER_SITE_OTHER): Payer: 59 | Admitting: Family Medicine

## 2019-04-03 ENCOUNTER — Encounter (INDEPENDENT_AMBULATORY_CARE_PROVIDER_SITE_OTHER): Payer: Self-pay | Admitting: Family Medicine

## 2019-04-03 VITALS — BP 104/70 | HR 93 | Temp 98.3°F | Ht 68.0 in | Wt 206.0 lb

## 2019-04-03 DIAGNOSIS — R7303 Prediabetes: Secondary | ICD-10-CM | POA: Diagnosis not present

## 2019-04-03 DIAGNOSIS — Z6831 Body mass index (BMI) 31.0-31.9, adult: Secondary | ICD-10-CM

## 2019-04-03 DIAGNOSIS — Z9189 Other specified personal risk factors, not elsewhere classified: Secondary | ICD-10-CM | POA: Diagnosis not present

## 2019-04-03 DIAGNOSIS — E559 Vitamin D deficiency, unspecified: Secondary | ICD-10-CM | POA: Diagnosis not present

## 2019-04-03 DIAGNOSIS — E669 Obesity, unspecified: Secondary | ICD-10-CM | POA: Diagnosis not present

## 2019-04-03 MED ORDER — VITAMIN D (ERGOCALCIFEROL) 1.25 MG (50000 UNIT) PO CAPS: 50000.0000 [IU] | ORAL_CAPSULE | ORAL | 0 refills | Status: DC

## 2019-04-03 MED ORDER — METFORMIN HCL 500 MG PO TABS: 500.0000 mg | ORAL_TABLET | Freq: Two times a day (BID) | ORAL | 0 refills | Status: DC

## 2019-04-04 MED FILL — LINZESS 145 MCG CAPSULE: 145 | 30 days supply | Qty: 30 | Fill #1

## 2019-04-04 NOTE — Progress Notes (Signed)
Office: [phone]  /  Fax: [phone]   HPI:  Chief Complaint: OBESITY Angela [name] is here to discuss her progress with her obesity treatment plan. She is on the Category 3 plan and journaling in the afternoon and states she is following her eating plan approximately 85% of the time. She states she is doing physical therapy 45-50 minutes 5 times per week.  Angela [name] did well with PC over Thanksgiving. She is working on meal planning, but notes increased stress while in the process of buying a house. She is retaining a bit of water weight today.  Today's visit was #12  Starting weight: 220 lbs Starting date: 10/05/2018 Today's weight: 206 lbs  Today's date: 04/03/2019 Total lbs lost to date: 14  Total lbs lost since last in-office visit: 0  Vitamin D deficiency Angela [name] has a diagnosis of Vitamin D deficiency and is stable on prescription Vitamin D. Last Vitamin D was 37.5 on 02/02/2019, not yet at goal. No nausea, vomiting, or muscle weakness.  Insulin Resistance [name] has a diagnosis of insulin resistance and is working on diet and weight loss. She reports decreased polyphagia. No nausea or vomiting.  At risk for diabetes [name] is at higher than average risk for developing diabetes due to her obesity.   ASSESSMENT AND PLAN:  Vitamin D deficiency - Plan: Vitamin D, Ergocalciferol, (DRISDOL) 1.25 MG (50000 UT) CAPS capsule  Prediabetes - Plan: metFORMIN (GLUCOPHAGE) 500 MG tablet  At risk for diabetes mellitus  Class 1 obesity with serious comorbidity and body mass index (BMI) of 31.0 to 31.9 in adult, unspecified obesity type  PLAN:  Vitamin D Deficiency [name] was informed that low Vitamin D levels contributes to fatigue and are associated with obesity, breast, and colon cancer. She agrees to continue to take prescription Vit D @ 50,000 IU every week #4 with 0 refills and will follow-up for routine testing of Vitamin D in 1-2 months. She was informed of the risk of  over-replacement of Vitamin D and agrees to not increase her dose unless she discusses this with [place] first. Leyton agrees to follow-up with our clinic in 3-4 weeks.  Insulin Resistance Angela [name] will continue to work on weight loss, exercise, and decreasing simple carbohydrates to help decrease the risk of diabetes. She was given a refill on her metformin 500 mg BID #60 with 0 refills.  [name] agreed to follow-up with [place] as directed to closely monitor her progress.  Diabetes risk counseling (~15 min) Angela [name] is a 43 y.o. female and has risk factors for diabetes including obesity. We discussed intensive lifestyle modifications today with an emphasis on weight loss as well as increasing exercise and decreasing simple carbohydrates in her diet.  Obesity Angela [name] is currently in the action stage of change. As such, her goal is to continue with weight loss efforts. She has agreed to follow the Category 3 plan and journal 400-550 calories and 40+ grams of protein at supper. Angela [name] has been instructed to work up to a goal of 150 minutes of combined cardio and strengthening exercise per week for weight loss and overall health benefits. We discussed the following Behavioral Modification Strategies today: work on meal planning and easy cooking plans, and holiday eating strategies.  Angela [name] has agreed to follow-up with our clinic in 3-4 weeks. She was informed of the importance of frequent follow-up visits to maximize her success with intensive lifestyle modifications for her multiple health conditions.  ALLERGIES: Allergies  Allergen Reactions  . Quinoa-Kale-Hemp [Alimentum]  MEDICATIONS: Current Outpatient Medications on File Prior to Visit  Medication Sig Dispense Refill  . brimonidine-timolol (COMBIGAN) 0.2-0.5 % ophthalmic solution Place 1 drop into the right eye every 12 (twelve) hours.     . cetirizine (ZYRTEC) 10 MG tablet Take 10 mg by mouth daily as needed for allergies.    . cyclobenzaprine  (FLEXERIL) 10 MG tablet Take 1 tablet (10 mg total) by mouth at bedtime as needed for muscle spasms. 30 tablet 0  . dorzolamide (TRUSOPT) 2 % ophthalmic solution Place 1 drop into the right eye 3 (three) times daily.    . fluticasone (FLONASE) 50 MCG/ACT nasal spray Place 1 spray into both nostrils 2 (two) times daily as needed for allergies.     Marland Kitchen ibuprofen (ADVIL,MOTRIN) 600 MG tablet Take 1 tablet (600 mg total) by mouth every 6 (six) hours as needed for mild pain, moderate pain or cramping. 30 tablet 0  . latanoprost (XALATAN) 0.005 % ophthalmic solution Place 1 drop into the right eye at bedtime.     Marland Kitchen linaclotide (LINZESS) 145 MCG CAPS capsule Take 1 capsule (145 mcg total) by mouth daily before breakfast. 30 capsule 2  . psyllium (METAMUCIL) 58.6 % packet Take 1 packet by mouth daily.    . RHOPRESSA 0.02 % SOLN      No current facility-administered medications on file prior to visit.    PAST MEDICAL HISTORY: Past Medical History:  Diagnosis Date  . Allergy Enviromental  . Anemia may/2017  . Asthma    as a child  . Back pain gerd  . Constipation   . Glaucoma   . Heart murmur childhood  . IBS (irritable bowel syndrome)   . Joint pain   . Menorrhagia   . Palpitations   . Plantar fasciitis   . Prediabetes   . Swelling    feet, legs    PAST SURGICAL HISTORY: Past Surgical History:  Procedure Laterality Date  . CHOLECYSTECTOMY  2005  . COLONOSCOPY WITH PROPOFOL N/A 10/27/2018   Procedure: COLONOSCOPY WITH PROPOFOL;  Surgeon: Lin Landsman, MD;  Location: [facility];  Service: Gastroenterology;  Laterality: N/A;  . DILATATION & CURETTAGE/HYSTEROSCOPY WITH MYOSURE N/A 06/23/2018   Procedure: DILATATION & CURETTAGE/HYSTEROSCOPY WITH MYOSURE;  Surgeon: Will Bonnet, MD;  Location: [facility];  Service: Gynecology;  Laterality: N/A;  . DILATION AND CURETTAGE OF UTERUS    . ERCP     3 times  . ESOPHAGOGASTRODUODENOSCOPY (EGD) WITH PROPOFOL  10/27/2018   Procedure:  ESOPHAGOGASTRODUODENOSCOPY (EGD) WITH PROPOFOL;  Surgeon: Lin Landsman, MD;  Location: [facility];  Service: Gastroenterology;;  . EYE SURGERY    . MENISCUS REPAIR Right    x 2    SOCIAL HISTORY: Social History   Tobacco Use  . Smoking status: Never Smoker  . Smokeless tobacco: Never Used  Substance Use Topics  . Alcohol use: Yes    Alcohol/week: 2.0 standard drinks    Types: 2 Glasses of wine per week    Comment: occasional  . Drug use: No    FAMILY HISTORY: Family History  Problem Relation Age of Onset  . Colon cancer Mother 64  . Diabetes Mother   . Hypertension Mother   . Arthritis Mother   . Transient ischemic attack Mother   . Liver disease Mother   . Obesity Mother   . Diabetes Father   . Heart disease Father   . Esophageal cancer Maternal Grandmother   . Cancer Maternal Grandmother   . Diabetes  Maternal Grandmother   . Alcohol abuse Maternal Grandfather   . Breast cancer Maternal Aunt        mat great aunt   ROS: Review of Systems  Gastrointestinal: Negative for nausea and vomiting.  Musculoskeletal:       Negative for muscle weakness.  Endo/Heme/Allergies:       Positive for decreased polyphagia.   PHYSICAL EXAM: Blood pressure 104/70, pulse 93, temperature 98.3 F (36.8 C), temperature source Oral, height 5\' 8"  (1.727 m), weight 206 lb (93.4 kg), last menstrual period 03/27/2019, SpO2 98 %. Body mass index is 31.32 kg/m. Physical Exam Vitals reviewed.  Constitutional:      Appearance: Normal appearance. She is obese.  Cardiovascular:     Rate and Rhythm: Normal rate.     Pulses: Normal pulses.  Pulmonary:     Effort: Pulmonary effort is normal.     Breath sounds: Normal breath sounds.  Musculoskeletal:        General: Normal range of motion.  Skin:    General: Skin is warm and dry.  Neurological:     Mental Status: She is alert and oriented to person, place, and time.  Psychiatric:        Behavior: Behavior normal.   RECENT  LABS AND TESTS: BMET    Component Value Date/Time   NA 139 02/02/2019 1015   K 4.5 02/02/2019 1015   CL 105 02/02/2019 1015   CO2 21 02/02/2019 1015   GLUCOSE 88 02/02/2019 1015   GLUCOSE 105 (H) 06/13/2018 1006   BUN 16 02/02/2019 1015   CREATININE 0.76 02/02/2019 1015   CALCIUM 9.3 02/02/2019 1015   GFRNONAA 96 02/02/2019 1015   GFRAA 110 02/02/2019 1015   Lab Results  Component Value Date   HGBA1C 5.5 02/02/2019   HGBA1C 5.7 (H) 10/05/2018   HGBA1C 5.9 06/22/2018   HGBA1C 5.8 06/21/2017   HGBA1C 5.8 08/31/2016   Lab Results  Component Value Date   INSULIN 21.9 02/02/2019   INSULIN 61.1 (H) 10/05/2018   CBC    Component Value Date/Time   WBC 6.7 03/01/2019 1415   WBC 5.4 09/22/2017 0957   RBC 3.97 03/01/2019 1415   RBC 4.32 09/22/2017 0957   HGB 10.6 (L) 03/01/2019 1415   HCT 32.9 (L) 03/01/2019 1415   PLT 265 03/01/2019 1415   MCV 83 03/01/2019 1415   MCH 26.7 03/01/2019 1415   MCHC 32.2 03/01/2019 1415   MCHC 34.0 09/22/2017 0957   RDW 14.3 03/01/2019 1415   LYMPHSABS 1.6 10/05/2018 1005   EOSABS 0.2 10/05/2018 1005   BASOSABS 0.0 10/05/2018 1005   Iron/TIBC/Ferritin/ %Sat    Component Value Date/Time   IRON 23 (L) 03/01/2019 1415   TIBC 345 03/01/2019 1415   FERRITIN 12 (L) 03/01/2019 1415   IRONPCTSAT 7 (LL) 03/01/2019 1415   IRONPCTSAT 7 (L) 09/08/2016 1109   Lipid Panel     Component Value Date/Time   CHOL 156 10/05/2018 1005   TRIG 106 10/05/2018 1005   HDL 61 10/05/2018 1005   CHOLHDL 3 06/22/2018 1113   VLDL 20.6 06/22/2018 1113   LDLCALC 74 10/05/2018 1005   Hepatic Function Panel     Component Value Date/Time   PROT 7.0 02/02/2019 1015   ALBUMIN 4.2 02/02/2019 1015   AST 21 02/02/2019 1015   ALT 26 02/02/2019 1015   ALKPHOS 78 02/02/2019 1015   BILITOT 0.3 02/02/2019 1015   BILIDIR 0.1 06/22/2018 1113      Component Value  Date/Time   TSH 2.020 10/05/2018 1005   TSH 1.660 04/29/2018 1007      I, Michaelene Song, am acting  as Location manager for Dennard Nip, MD I have reviewed the above documentation for accuracy and completeness, and I agree with the above. -Dennard Nip, MD

## 2019-04-05 DIAGNOSIS — Z79899 Other long term (current) drug therapy: Secondary | ICD-10-CM | POA: Diagnosis not present

## 2019-04-05 DIAGNOSIS — H44511 Absolute glaucoma, right eye: Secondary | ICD-10-CM | POA: Diagnosis not present

## 2019-04-05 DIAGNOSIS — H4031X3 Glaucoma secondary to eye trauma, right eye, severe stage: Secondary | ICD-10-CM | POA: Diagnosis not present

## 2019-04-06 DIAGNOSIS — M546 Pain in thoracic spine: Secondary | ICD-10-CM | POA: Diagnosis not present

## 2019-04-06 MED FILL — ISOPTO ATROPINE 1 % SOLN: 1 | 50 days supply | Qty: 5 | Fill #0

## 2019-04-06 MED FILL — metFORMIN HCL 500 MG TABS: 500 | 30 days supply | Qty: 60 | Fill #0

## 2019-04-10 MED FILL — VIT D2 1.25 MG (50,000 UNIT: 1.25 MG | 28 days supply | Qty: 4 | Fill #0

## 2019-04-12 DIAGNOSIS — M546 Pain in thoracic spine: Secondary | ICD-10-CM | POA: Diagnosis not present

## 2019-04-20 DIAGNOSIS — M546 Pain in thoracic spine: Secondary | ICD-10-CM | POA: Diagnosis not present

## 2019-04-24 DIAGNOSIS — H44511 Absolute glaucoma, right eye: Secondary | ICD-10-CM | POA: Diagnosis not present

## 2019-04-24 DIAGNOSIS — H4031X3 Glaucoma secondary to eye trauma, right eye, severe stage: Secondary | ICD-10-CM | POA: Diagnosis not present

## 2019-04-27 MED FILL — FUSION PLUS CAPSULE: 60 days supply | Qty: 30 | Fill #1

## 2019-05-03 ENCOUNTER — Other Ambulatory Visit: Payer: Self-pay

## 2019-05-03 ENCOUNTER — Ambulatory Visit (INDEPENDENT_AMBULATORY_CARE_PROVIDER_SITE_OTHER): Payer: 59 | Admitting: Family Medicine

## 2019-05-03 ENCOUNTER — Encounter (INDEPENDENT_AMBULATORY_CARE_PROVIDER_SITE_OTHER): Payer: Self-pay | Admitting: Family Medicine

## 2019-05-03 VITALS — BP 107/70 | HR 85 | Temp 98.2°F | Ht 68.0 in | Wt 206.0 lb

## 2019-05-03 DIAGNOSIS — E669 Obesity, unspecified: Secondary | ICD-10-CM | POA: Diagnosis not present

## 2019-05-03 DIAGNOSIS — R7303 Prediabetes: Secondary | ICD-10-CM | POA: Diagnosis not present

## 2019-05-03 DIAGNOSIS — E559 Vitamin D deficiency, unspecified: Secondary | ICD-10-CM | POA: Diagnosis not present

## 2019-05-03 DIAGNOSIS — Z9189 Other specified personal risk factors, not elsewhere classified: Secondary | ICD-10-CM

## 2019-05-03 DIAGNOSIS — Z6831 Body mass index (BMI) 31.0-31.9, adult: Secondary | ICD-10-CM | POA: Diagnosis not present

## 2019-05-06 MED FILL — COMBIGAN EYE DROPS: 0.2-0.5 | 30 days supply | Qty: 5 | Fill #1

## 2019-05-06 MED FILL — LINZESS 145 MCG CAPSULE: 145 | 30 days supply | Qty: 30 | Fill #2

## 2019-05-08 MED ORDER — METFORMIN HCL 500 MG PO TABS: 500.0000 mg | ORAL_TABLET | Freq: Two times a day (BID) | ORAL | 0 refills | Status: DC

## 2019-05-08 MED ORDER — VITAMIN D (ERGOCALCIFEROL) 1.25 MG (50000 UNIT) PO CAPS: 50000.0000 [IU] | ORAL_CAPSULE | ORAL | 0 refills | Status: DC

## 2019-05-08 MED FILL — VIT D2 1.25 MG (50,000 UNIT: 1.25 MG | 28 days supply | Qty: 4 | Fill #0

## 2019-05-08 MED FILL — metFORMIN HCL 500 MG TABS: 500 | 30 days supply | Qty: 60 | Fill #0

## 2019-05-08 NOTE — Progress Notes (Signed)
Chief Complaint:   OBESITY Angela [name] is here to discuss her progress with her obesity treatment plan along with follow-up of her obesity related diagnoses. Angela [name] is on the Category 3 Plan and keeping a food journal and adhering to recommended goals of 400-550 calories and 40+ grams of protein at supper daily and states she is following her eating plan approximately 85% of the time. Angela [name] states she is doing core training for 20 minutes 5 times per week.  Today's visit was #: 44 Starting weight: 220 lbs Starting date: 10/05/2018 Today's weight: 206 lbs Today's date: 05/03/2019 Total lbs lost to date: 14 Total lbs lost since last in-office visit: 0  Interim History: Angela [name] continues to do well with diet and she was able to maintain her weight over the holidays. She is getting a bit bored with breakfast and would like more options.  Subjective:   1. Pre-diabetes Angela [name] is stable on metformin and diet. She denies nausea, vomiting, or muscle weakness.  2. Vitamin D deficiency Angela [name] is stable on Vit D, and she is due to have labs checked soon.  3. At risk for diabetes mellitus Angela [name] is at higher than average risk for developing diabetes due to her obesity.   Assessment/Plan:   1. Pre-diabetes Angela [name] will continue to work on diet, exercise, weight loss, and decreasing simple carbohydrates to help decrease the risk of diabetes. We will refill metformin for 1 month, and we will recheck labs in 3 weeks.  - metFORMIN (GLUCOPHAGE) 500 MG tablet; Take 1 tablet (500 mg total) by mouth 2 (two) times daily with a meal.  Dispense: 60 tablet; Refill: 0  2. Vitamin D deficiency Low Vitamin D level contributes to fatigue and are associated with obesity, breast, and colon cancer. We will refill prescription Vit D for 1 month. She will follow-up for routine testing of Vitamin D, at least 2-3 times per year to avoid over-replacement. We will recheck labs in 3 weeks and will continue to  monitor.  - Vitamin D, Ergocalciferol, (DRISDOL) 1.25 MG (50000 UNIT) CAPS capsule; Take 1 capsule (50,000 Units total) by mouth every 7 (seven) days.  Dispense: 4 capsule; Refill: 0  3. At risk for diabetes mellitus Angela [name] was given approximately 15 minutes of diabetes education and counseling today. We discussed intensive lifestyle modifications today with an emphasis on weight loss as well as increasing exercise and decreasing simple carbohydrates in her diet. We also reviewed medication options with an emphasis on risk versus benefit of those discussed.   4. Class 1 obesity with serious comorbidity and body mass index (BMI) of 31.0 to 31.9 in adult, unspecified obesity type Angela [name] is currently in the action stage of change. As such, her goal is to continue with weight loss efforts. She has agreed to on the Category 3 Plan with breakfast options and keeping a food journal and adhering to recommended goals of 400-550 calories and 40+ grams of protein at supper daily.   Exercise goals: Angela [name] is to continue her current exercise regimen as is.  Behavioral modification strategies: meal planning and cooking strategies.  Angela [name] has agreed to follow-up with our clinic in 3 weeks. She was informed of the importance of frequent follow-up visits to maximize her success with intensive lifestyle modifications for her multiple health conditions.   Objective:   Blood pressure 107/70, pulse 85, temperature 98.2 F (36.8 C), temperature source Oral, height 5\' 8"  (1.727 m), weight 206 lb (93.4 kg), last menstrual period 04/25/2019, SpO2 97 %.  Body mass index is 31.32 kg/m.  General: Cooperative, alert, well developed, in no acute distress. HEENT: Conjunctivae and lids unremarkable. Cardiovascular: Regular rhythm.  Lungs: Normal work of breathing. Neurologic: No focal deficits.   Lab Results  Component Value Date   CREATININE 0.76 02/02/2019   BUN 16 02/02/2019   NA 139 02/02/2019   K 4.5  02/02/2019   CL 105 02/02/2019   CO2 21 02/02/2019   Lab Results  Component Value Date   ALT 26 02/02/2019   AST 21 02/02/2019   ALKPHOS 78 02/02/2019   BILITOT 0.3 02/02/2019   Lab Results  Component Value Date   HGBA1C 5.5 02/02/2019   HGBA1C 5.7 (H) 10/05/2018   HGBA1C 5.9 06/22/2018   HGBA1C 5.8 06/21/2017   HGBA1C 5.8 08/31/2016   Lab Results  Component Value Date   INSULIN 21.9 02/02/2019   INSULIN 61.1 (H) 10/05/2018   Lab Results  Component Value Date   TSH 2.020 10/05/2018   Lab Results  Component Value Date   CHOL 156 10/05/2018   HDL 61 10/05/2018   LDLCALC 74 10/05/2018   TRIG 106 10/05/2018   CHOLHDL 3 06/22/2018   Lab Results  Component Value Date   WBC 6.7 03/01/2019   HGB 10.6 (L) 03/01/2019   HCT 32.9 (L) 03/01/2019   MCV 83 03/01/2019   PLT 265 03/01/2019   Lab Results  Component Value Date   IRON 23 (L) 03/01/2019   TIBC 345 03/01/2019   FERRITIN 12 (L) 03/01/2019   Attestation Statements:   Reviewed by clinician on day of visit: allergies, medications, problem list, medical history, surgical history, family history, social history, and previous encounter notes.   I, Trixie Dredge, am acting as transcriptionist for Dennard Nip, MD.  I have reviewed the above documentation for accuracy and completeness, and I agree with the above. -  Dennard Nip, MD

## 2019-05-15 DIAGNOSIS — M545 Low back pain: Secondary | ICD-10-CM | POA: Diagnosis not present

## 2019-05-15 DIAGNOSIS — E559 Vitamin D deficiency, unspecified: Secondary | ICD-10-CM | POA: Insufficient documentation

## 2019-05-15 DIAGNOSIS — E569 Vitamin deficiency, unspecified: Secondary | ICD-10-CM | POA: Insufficient documentation

## 2019-05-15 DIAGNOSIS — R0781 Pleurodynia: Secondary | ICD-10-CM | POA: Diagnosis not present

## 2019-05-15 DIAGNOSIS — E611 Iron deficiency: Secondary | ICD-10-CM | POA: Insufficient documentation

## 2019-05-25 ENCOUNTER — Ambulatory Visit (INDEPENDENT_AMBULATORY_CARE_PROVIDER_SITE_OTHER): Payer: 59 | Admitting: Family Medicine

## 2019-05-25 ENCOUNTER — Other Ambulatory Visit: Payer: Self-pay

## 2019-05-25 ENCOUNTER — Encounter (INDEPENDENT_AMBULATORY_CARE_PROVIDER_SITE_OTHER): Payer: Self-pay | Admitting: Family Medicine

## 2019-05-25 VITALS — BP 112/72 | HR 79 | Temp 98.2°F | Ht 68.0 in | Wt 204.0 lb

## 2019-05-25 DIAGNOSIS — E669 Obesity, unspecified: Secondary | ICD-10-CM

## 2019-05-25 DIAGNOSIS — Z9189 Other specified personal risk factors, not elsewhere classified: Secondary | ICD-10-CM

## 2019-05-25 DIAGNOSIS — R7303 Prediabetes: Secondary | ICD-10-CM | POA: Diagnosis not present

## 2019-05-25 DIAGNOSIS — Z6831 Body mass index (BMI) 31.0-31.9, adult: Secondary | ICD-10-CM

## 2019-05-25 DIAGNOSIS — D508 Other iron deficiency anemias: Secondary | ICD-10-CM | POA: Diagnosis not present

## 2019-05-25 DIAGNOSIS — E559 Vitamin D deficiency, unspecified: Secondary | ICD-10-CM | POA: Diagnosis not present

## 2019-05-25 MED ORDER — METFORMIN HCL 500 MG PO TABS: 500.0000 mg | ORAL_TABLET | Freq: Two times a day (BID) | ORAL | 0 refills | Status: DC

## 2019-05-25 NOTE — Progress Notes (Signed)
Chief Complaint:   OBESITY Angela [name] is here to discuss her progress with her obesity treatment plan along with follow-up of her obesity related diagnoses. Angela [name] is on the Category 3 Plan and keeping a food journal of 450 to 600 calories and 10 grams of protein at supper daily plan and states she is following her eating plan approximately 90% of the time. Angela [name] states she is walking and doing core exercises 15 minutes 3 to 4 times per week.  Today's visit was #: 14 Starting weight: 220 lbs Starting date: 02/02/2019 Today's weight: 204 lbs Today's date: 05/25/2019 Total lbs lost to date: 16 Total lbs lost since last in-office visit: 2  Interim History: Angela [name] has been mostly working and staying at home. She is following the plan, except for occasional dinners, when she will journal. Angela [name] has no hunger or cravings. She is planning to possibly take time off in April, but it is not definite.  Subjective:   Vitamin D deficiency  Angela [name] Vitamin D level was 37.5 on 02/02/19. She is on prescription vit D. She admits fatigue and denies nausea, vomiting or muscle weakness. Labs were discussed with patient today.  Prediabetes  Angela [name] is on metformin twice daily. She has no GI side effects of metformin. Her carb cravings are controlled. She continues to work on diet and exercise to decrease her risk of diabetes. Labs were discussed with patient today.  Lab Results  Component Value Date   HGBA1C 5.5 02/02/2019   Lab Results  Component Value Date   INSULIN 21.9 02/02/2019   INSULIN 61.1 (H) 10/05/2018   Other iron deficiency anemia  Angela [name]'s last anemia panel is still showing iron deficiency. Her hematocrit and hemoglobin was slightly low at the last lab draw. Labs were discussed with patient today.  At risk for osteoporosis Angela [name] is at higher risk of osteopenia and osteoporosis due to Vitamin D deficiency.   Assessment/Plan:   Vitamin D deficiency  Low Vitamin D  level contributes to fatigue and are associated with obesity, breast, and colon cancer. Angela [name] will continue to take prescription Vitamin D @50 ,000 IU every week and we will check vitamin D level today. She will follow-up for routine testing of Vitamin D, at least 2-3 times per year to avoid over-replacement.  Prediabetes  Angela [name] will continue to work on weight loss, exercise, and decreasing simple carbohydrates to help decrease the risk of diabetes. We will check A1c and insulin level today. Angela [name] agrees to continue metFORMIN (GLUCOPHAGE) 500 MG tablet tow times daily #60 with no refills.  Other iron deficiency anemia  We will check CBC and anemia panel today.  At risk for osteoporosis Angela [name] was given approximately 15 minutes of osteoporosis prevention counseling today. Angela [name] is at risk for osteopenia and osteoporosis due to her Vitamin D deficiency. She was encouraged to take her Vitamin D and follow her higher calcium diet and increase strengthening exercise to help strengthen her bones and decrease her risk of osteopenia and osteoporosis.  Repetitive spaced learning was employed today to elicit superior memory formation and behavioral change.  Class 1 obesity with serious comorbidity and body mass index (BMI) of 31.0 to 31.9 in adult, unspecified obesity type Angela [name] is currently in the action stage of change. As such, her goal is to continue with weight loss efforts. She has agreed to the Category 3 Plan and keeping a food journal and adhering to recommended goals of 450 to 600 calories and 40 grams of protein at  supper daily.   Exercise goals: Angela [name] will continue her current exercise regimen.  Behavioral modification strategies: increasing lean protein intake, increasing vegetables, meal planning and cooking strategies and planning for success.  Angela [name] has agreed to follow-up with our clinic in 3 to 4 weeks. She was informed of the importance of frequent follow-up visits to maximize her  success with intensive lifestyle modifications for her multiple health conditions.   Angela [name] was informed we would discuss her lab results at her next visit unless there is a critical issue that needs to be addressed sooner. Angela [name] agreed to keep her next visit at the agreed upon time to discuss these results.  Objective:   Blood pressure 112/72, pulse 79, temperature 98.2 F (36.8 C), temperature source Oral, height 5\' 8"  (1.727 m), weight 204 lb (92.5 kg), last menstrual period 05/21/2019, SpO2 99 %. Body mass index is 31.02 kg/m.  General: Cooperative, alert, well developed, in no acute distress. HEENT: Conjunctivae and lids unremarkable. Cardiovascular: Regular rhythm.  Lungs: Normal work of breathing. Neurologic: No focal deficits.   Lab Results  Component Value Date   CREATININE 0.76 02/02/2019   BUN 16 02/02/2019   NA 139 02/02/2019   K 4.5 02/02/2019   CL 105 02/02/2019   CO2 21 02/02/2019   Lab Results  Component Value Date   ALT 26 02/02/2019   AST 21 02/02/2019   ALKPHOS 78 02/02/2019   BILITOT 0.3 02/02/2019   Lab Results  Component Value Date   HGBA1C 5.5 02/02/2019   HGBA1C 5.7 (H) 10/05/2018   HGBA1C 5.9 06/22/2018   HGBA1C 5.8 06/21/2017   HGBA1C 5.8 08/31/2016   Lab Results  Component Value Date   INSULIN 21.9 02/02/2019   INSULIN 61.1 (H) 10/05/2018   Lab Results  Component Value Date   TSH 2.020 10/05/2018   Lab Results  Component Value Date   CHOL 156 10/05/2018   HDL 61 10/05/2018   LDLCALC 74 10/05/2018   TRIG 106 10/05/2018   CHOLHDL 3 06/22/2018   Lab Results  Component Value Date   WBC 6.7 03/01/2019   HGB 10.6 (L) 03/01/2019   HCT 32.9 (L) 03/01/2019   MCV 83 03/01/2019   PLT 265 03/01/2019   Lab Results  Component Value Date   IRON 23 (L) 03/01/2019   TIBC 345 03/01/2019   FERRITIN 12 (L) 03/01/2019    Ref. Range 02/02/2019 10:15  Vitamin D, 25-Hydroxy Latest Ref Range: 30.0 - 100.0 ng/mL 37.5    Attestation  Statements:   Reviewed by clinician on day of visit: allergies, medications, problem list, medical history, surgical history, family history, social history, and previous encounter notes.  I, Doreene Nest, am acting as transcriptionist for Eber Jones, MD.  I have reviewed the above documentation for accuracy and completeness, and I agree with the above. - Ilene Qua, MD

## 2019-05-26 LAB — CBC WITH DIFFERENTIAL/PLATELET
Basophils Absolute: 0 10*3/uL (ref 0.0–0.2)
Basos: 1 %
EOS (ABSOLUTE): 0.3 10*3/uL (ref 0.0–0.4)
Eos: 6 %
Hemoglobin: 12.3 g/dL (ref 11.1–15.9)
Immature Grans (Abs): 0 10*3/uL (ref 0.0–0.1)
Immature Granulocytes: 0 %
Lymphocytes Absolute: 1.7 10*3/uL (ref 0.7–3.1)
Lymphs: 34 %
MCH: 29.3 pg (ref 26.6–33.0)
MCHC: 33.3 g/dL (ref 31.5–35.7)
MCV: 88 fL (ref 79–97)
Monocytes Absolute: 0.3 10*3/uL (ref 0.1–0.9)
Monocytes: 6 %
Neutrophils Absolute: 2.6 10*3/uL (ref 1.4–7.0)
Neutrophils: 53 %
Platelets: 223 10*3/uL (ref 150–450)
RBC: 4.2 x10E6/uL (ref 3.77–5.28)
RDW: 15.5 % — ABNORMAL HIGH (ref 11.7–15.4)
WBC: 4.8 10*3/uL (ref 3.4–10.8)

## 2019-05-26 LAB — ANEMIA PANEL
Ferritin: 39 ng/mL (ref 15–150)
Folate, Hemolysate: 324 ng/mL
Folate, RBC: 878 ng/mL (ref >498)
Hematocrit: 36.9 % (ref 34.0–46.6)
Iron Saturation: 18 % (ref 15–55)
Iron: 58 ug/dL (ref 27–159)
Retic Ct Pct: 1.1 % (ref 0.6–2.6)
Total Iron Binding Capacity: 319 ug/dL (ref 250–450)
UIBC: 261 ug/dL (ref 131–425)
Vitamin B-12: 797 pg/mL (ref 232–1245)

## 2019-05-26 LAB — HEMOGLOBIN A1C
Est. average glucose Bld gHb Est-mCnc: 111 mg/dL
Hgb A1c MFr Bld: 5.5 % (ref 4.8–5.6)

## 2019-05-26 LAB — VITAMIN D 25 HYDROXY (VIT D DEFICIENCY, FRACTURES): Vit D, 25-Hydroxy: 35.6 ng/mL (ref 30.0–100.0)

## 2019-05-26 LAB — INSULIN, RANDOM: INSULIN: 24.5 u[IU]/mL (ref 2.6–24.9)

## 2019-06-05 ENCOUNTER — Other Ambulatory Visit (INDEPENDENT_AMBULATORY_CARE_PROVIDER_SITE_OTHER): Payer: Self-pay | Admitting: Family Medicine

## 2019-06-05 ENCOUNTER — Other Ambulatory Visit: Payer: Self-pay | Admitting: Gastroenterology

## 2019-06-05 ENCOUNTER — Other Ambulatory Visit: Payer: Self-pay

## 2019-06-05 ENCOUNTER — Ambulatory Visit (INDEPENDENT_AMBULATORY_CARE_PROVIDER_SITE_OTHER): Payer: 59 | Admitting: Gastroenterology

## 2019-06-05 ENCOUNTER — Encounter: Payer: Self-pay | Admitting: Gastroenterology

## 2019-06-05 VITALS — BP 109/70 | HR 86 | Temp 98.8°F | Resp 17 | Ht 68.0 in | Wt 210.2 lb

## 2019-06-05 DIAGNOSIS — D509 Iron deficiency anemia, unspecified: Secondary | ICD-10-CM | POA: Diagnosis not present

## 2019-06-05 DIAGNOSIS — K5909 Other constipation: Secondary | ICD-10-CM

## 2019-06-05 DIAGNOSIS — E559 Vitamin D deficiency, unspecified: Secondary | ICD-10-CM

## 2019-06-05 MED ORDER — LINACLOTIDE 145 MCG PO CAPS: 145.0000 ug | ORAL_CAPSULE | Freq: Every day | ORAL | 0 refills | Status: DC

## 2019-06-05 MED FILL — LINZESS 145 MCG CAPSULE: 145 | 30 days supply | Qty: 30 | Fill #0

## 2019-06-05 MED FILL — metFORMIN HCL 500 MG TABS: 500 | 30 days supply | Qty: 60 | Fill #0

## 2019-06-05 MED FILL — VIT D2 1.25 MG (50,000 UNIT: 1.25 MG | 28 days supply | Qty: 4 | Fill #0

## 2019-06-05 MED FILL — ATROPINE 1% EYE DROPS: 1 | 90 days supply | Qty: 5 | Fill #1

## 2019-06-05 NOTE — Progress Notes (Signed)
[name], MD [address]  [place]  [place], [place] [address]  Main: [phone]  Fax: [phone]    Gastroenterology Consultation  Referring Provider:     Pleas Koch, NP Primary Care Physician:  Pleas Koch, NP Primary Gastroenterologist:  Dr. [name] Reason for Consultation:     Chronic constipation        HPI:   Angela [name] is a 44 y.o. female referred by Dr. Carlis Abbott, Leticia Penna, NP  for consultation & management of chronic constipation. Patient reports that she has been experiencing irregular bowel habits for last 2 years, reports having hard stools, frequency every 5 days followed by several bowel movements.  She has joined weight loss program with [place] and trying to eat healthy.  She is trying to incorporate more fiber in her diet.  She states MiraLAX is not working.  She takes Metamucil which does regulate her bowel movements however, she experiences right lower quadrant pain with it in 2 to 3 days after taking Metamucil.  She denies rectal bleeding.  She also has history of iron deficiency for which she was taking oral iron.  She stopped oral replacement due to worsening of constipation.  She is a Software engineer at [employer] She does not smoke or drink alcohol  Follow-up visit 06/05/2019 Patient has been seeing Dr. Leafy Ro for healthy weight and weight loss management.  She has done great with their meal management program and physical activity.  She reports that her constipation has significantly improved.  She continues to take Linzess 145 MCG daily.  She is taking fusion plus in her anemia has resolved.  Iron levels are improving.  She does not have any concerns today  NSAIDs: None  Antiplts/Anticoagulants/Anti thrombotics: None  GI Procedures:  EGD and colonoscopy 10/27/2018 - Normal duodenal bulb and second portion of the duodenum. - A few gastric polyps. Resected and retrieved. - Normal gastroesophageal  junction and esophagus.  - One 5 mm polyp in the cecum, removed with mucosal resection. Resected and retrieved. Clip was placed. - One 8 mm polyp in the ascending colon, removed with a hot snare. Resected and retrieved. Clip was placed. - One 5 mm polyp in the descending colon, removed with a cold snare. Resected and retrieved. - The distal rectum and anal verge are normal on retroflexion view. - The examination was otherwise normal. - Examined portion of the terminal ileum was normal - Mucosal resection was performed. Resection and retrieval were complete.  DIAGNOSIS:  A. STOMACH POLYP X2, FUNDUS; HOT SNARE:  - FUNDIC GLAND POLYP, 2 FRAGMENTS.  - NEGATIVE FOR DYSPLASIA AND MALIGNANCY.   B. COLON POLYP, CECUM; HOT SNARE:  - SESSILE SERRATED POLYP.  - NEGATIVE FOR DYSPLASIA AND MALIGNANCY.   C. COLON POLYP, ASCENDING; HOT SNARE:  - TUBULAR ADENOMA.  - NEGATIVE FOR HIGH-GRADE DYSPLASIA AND MALIGNANCY.   D. COLON POLYP, DESCENDING; COLD SNARE:  - TUBULAR ADENOMA.  - NEGATIVE FOR HIGH-GRADE DYSPLASIA AND MALIGNANCY.  Past Medical History:  Diagnosis Date  . Allergy Enviromental  . Anemia may/2017  . Asthma    as a child  . Back pain gerd  . Constipation   . Glaucoma   . Heart murmur childhood  . IBS (irritable bowel syndrome)   . Joint pain   . Menorrhagia   . Palpitations   . Plantar fasciitis   . Prediabetes   . Swelling    feet, legs    Past  Surgical History:  Procedure Laterality Date  . CHOLECYSTECTOMY  2005  . COLONOSCOPY WITH PROPOFOL N/A 10/27/2018   Procedure: COLONOSCOPY WITH PROPOFOL;  Surgeon: Lin Landsman, MD;  Location: [facility] ENDOSCOPY;  Service: Gastroenterology;  Laterality: N/A;  . DILATATION & CURETTAGE/HYSTEROSCOPY WITH MYOSURE N/A 06/23/2018   Procedure: DILATATION & CURETTAGE/HYSTEROSCOPY WITH MYOSURE;  Surgeon: Will Bonnet, MD;  Location: [facility] ORS;  Service: Gynecology;  Laterality: N/A;  . DILATION AND CURETTAGE OF UTERUS    .  ERCP     3 times  . ESOPHAGOGASTRODUODENOSCOPY (EGD) WITH PROPOFOL  10/27/2018   Procedure: ESOPHAGOGASTRODUODENOSCOPY (EGD) WITH PROPOFOL;  Surgeon: Lin Landsman, MD;  Location: [facility] ENDOSCOPY;  Service: Gastroenterology;;  . EYE SURGERY    . MENISCUS REPAIR Right    x 2    Current Outpatient Medications:  .  atropine 1 % ophthalmic solution, Place 1 drop into the right eye daily., Disp: , Rfl:  .  brimonidine-timolol (COMBIGAN) 0.2-0.5 % ophthalmic solution, Place 1 drop into the right eye every 12 (twelve) hours. , Disp: , Rfl:  .  cetirizine (ZYRTEC) 10 MG tablet, Take 10 mg by mouth daily as needed for allergies., Disp: , Rfl:  .  fluticasone (FLONASE) 50 MCG/ACT nasal spray, Place 1 spray into both nostrils 2 (two) times daily as needed for allergies. , Disp: , Rfl:  .  ibuprofen (ADVIL,MOTRIN) 600 MG tablet, Take 1 tablet (600 mg total) by mouth every 6 (six) hours as needed for mild pain, moderate pain or cramping., Disp: 30 tablet, Rfl: 0 .  Iron-FA-B Cmp-C-Biot-Probiotic (FUSION PLUS) CAPS, , Disp: , Rfl:  .  metFORMIN (GLUCOPHAGE) 500 MG tablet, Take 1 tablet (500 mg total) by mouth 2 (two) times daily with a meal., Disp: 60 tablet, Rfl: 0 .  psyllium (METAMUCIL) 58.6 % packet, Take 1 packet by mouth daily., Disp: , Rfl:  .  Vitamin D, Ergocalciferol, (DRISDOL) 1.25 MG (50000 UNIT) CAPS capsule, Take 1 capsule (50,000 Units total) by mouth every 7 (seven) days., Disp: 4 capsule, Rfl: 0 .  linaclotide (LINZESS) 145 MCG CAPS capsule, Take 1 capsule (145 mcg total) by mouth daily before breakfast., Disp: 90 capsule, Rfl: 0    Family History  Problem Relation Age of Onset  . Colon cancer Mother 81  . Diabetes Mother   . Hypertension Mother   . Arthritis Mother   . Transient ischemic attack Mother   . Liver disease Mother   . Obesity Mother   . Diabetes Father   . Heart disease Father   . Esophageal cancer Maternal Grandmother   . Cancer Maternal Grandmother   .  Diabetes Maternal Grandmother   . Alcohol abuse Maternal Grandfather   . Breast cancer Maternal Aunt        mat great aunt     Social History   Tobacco Use  . Smoking status: Never Smoker  . Smokeless tobacco: Never Used  Substance Use Topics  . Alcohol use: Yes    Alcohol/week: 2.0 standard drinks    Types: 2 Glasses of wine per week    Comment: occasional  . Drug use: No    Allergies as of 06/05/2019 - Review Complete 06/05/2019  Allergen Reaction Noted  . Quinoa-kale-hemp [alimentum]  10/05/2018    Review of Systems:    All systems reviewed and negative except where noted in HPI.   Physical Exam:  BP 109/70 (BP Location: Left Arm, Patient Position: Sitting, Cuff Size: Large)   Pulse 86  Temp 98.8 F (37.1 C)   Resp 17   Ht 5\' 8"  (1.727 m)   Wt 210 lb 3.2 oz (95.3 kg)   LMP 05/21/2019   BMI 31.96 kg/m  Patient's last menstrual period was 05/21/2019.  General:   Alert,  Well-developed, well-nourished, pleasant and cooperative in NAD Head:  Normocephalic and atraumatic. Eyes:  Sclera clear, no icterus.   Conjunctiva pink. Ears:  Normal auditory acuity. Nose:  No deformity, discharge, or lesions. Mouth:  No deformity or lesions,oropharynx pink & moist. Neck:  Supple; no masses or thyromegaly. Lungs:  Respirations even and unlabored.  Clear throughout to auscultation.   No wheezes, crackles, or rhonchi. No acute distress. Heart:  Regular rate and rhythm; no murmurs, clicks, rubs, or gallops. Abdomen:  Normal bowel sounds. Soft, non-tender and non-distended without masses, hepatosplenomegaly or hernias noted.  No guarding or rebound tenderness.   Rectal: Not performed Msk:  Symmetrical without gross deformities. Good, equal movement & strength bilaterally. Pulses:  Normal pulses noted. Extremities:  No clubbing or edema.  No cyanosis. Neurologic:  Alert and oriented x3;  grossly normal neurologically. Skin:  Intact without significant lesions or rashes. No  jaundice. Psych:  Alert and cooperative. Normal mood and affect.  Imaging Studies: Reviewed  Assessment and Plan:   Kenneshia [name] is a 44 y.o. female with obesity, prediabetes on Metformin seen in consultation for iron deficiency anemia and chronic constipation.  Chronic constipation TSH is normal Continue high-fiber diet Continue Linzess 145 MCG daily, prescription provided  Iron deficiency anemia EGD and colonoscopy are unremarkable, normal terminal ileum Continue fusion plus for 3 more months, recheck iron studies and CBC in 3 months by Dr. Leafy Ro or by her PCP We will hold off on video capsule endoscopy at this time  Obesity, prediabetes Congratulated her on weight loss and weight management program   Follow up as needed   [name], MD

## 2019-06-15 ENCOUNTER — Other Ambulatory Visit: Payer: Self-pay

## 2019-06-15 ENCOUNTER — Ambulatory Visit (INDEPENDENT_AMBULATORY_CARE_PROVIDER_SITE_OTHER): Payer: 59 | Admitting: Family Medicine

## 2019-06-15 ENCOUNTER — Encounter (INDEPENDENT_AMBULATORY_CARE_PROVIDER_SITE_OTHER): Payer: Self-pay | Admitting: Family Medicine

## 2019-06-15 VITALS — BP 95/65 | HR 95 | Temp 98.3°F | Ht 68.0 in | Wt 204.0 lb

## 2019-06-15 DIAGNOSIS — E559 Vitamin D deficiency, unspecified: Secondary | ICD-10-CM

## 2019-06-15 DIAGNOSIS — Z6831 Body mass index (BMI) 31.0-31.9, adult: Secondary | ICD-10-CM | POA: Diagnosis not present

## 2019-06-15 DIAGNOSIS — Z9189 Other specified personal risk factors, not elsewhere classified: Secondary | ICD-10-CM

## 2019-06-15 DIAGNOSIS — E669 Obesity, unspecified: Secondary | ICD-10-CM

## 2019-06-15 DIAGNOSIS — R7303 Prediabetes: Secondary | ICD-10-CM

## 2019-06-15 MED ORDER — VITAMIN D (ERGOCALCIFEROL) 1.25 MG (50000 UNIT) PO CAPS: ORAL_CAPSULE | ORAL | 0 refills | Status: DC

## 2019-06-15 MED ORDER — METFORMIN HCL 500 MG PO TABS: 500.0000 mg | ORAL_TABLET | Freq: Two times a day (BID) | ORAL | 0 refills | Status: DC

## 2019-06-15 MED FILL — COMBIGAN EYE DROPS: 0.2-0.5 | 30 days supply | Qty: 5 | Fill #2

## 2019-06-15 NOTE — Progress Notes (Signed)
Chief Complaint:   OBESITY Angela [name] is here to discuss her progress with her obesity treatment plan along with follow-up of her obesity related diagnoses. Angela [name] is on the Category 3 Plan and keeping a food journal of 450 to 600 calories and 40 grams of protein at supper daily and states she is following her eating plan approximately 80 to 85% of the time. Angela [name] states she is walking 10 minutes 2 times per week.  Today's visit was #: 15 Starting weight: 220 lbs Starting date: 02/02/2019 Today's weight: 204 lbs Today's date: 06/15/2019 Total lbs lost to date: 16 Total lbs lost since last in-office visit: 0  Interim History: Rhina's life has not had much change in the past two weeks. The past week, patient has eaten out more than normal, secondary to how busy she has been. She is ordering from [facility] or the [facility].  Subjective:   Prediabetes Angela [name] has a diagnosis of prediabetes and her last Hgb A1c was 5.5 and last insulin level was 24.5 (05/25/19). She has no GI side effects of metformin. [name] continues to work on diet and exercise to decrease her risk of diabetes.   Lab Results  Component Value Date   HGBA1C 5.5 05/25/2019   Lab Results  Component Value Date   INSULIN 24.5 05/25/2019   INSULIN 21.9 02/02/2019   INSULIN 61.1 (H) 10/05/2018   Vitamin D deficiency Cambell's Vitamin D level was 35.6 on 05/25/19. She is currently taking vit D. She admits fatigue and denies nausea, vomiting or muscle weakness.  At risk for diabetes mellitus Angela [name] is at higher than average risk for developing diabetes due to her obesity and prediabetes.   Assessment/Plan:   Prediabetes  Angela [name] agree to continue metformin 500 mg two times daily #60 with no refills and she will continue to work on weight loss, exercise, and decreasing simple carbohydrates to help decrease the risk of diabetes.   Vitamin D deficiency  Low Vitamin D level contributes to fatigue and are  associated with obesity, breast, and colon cancer. Angela [name] agrees to continue to take prescription Vitamin D @50 ,000 IU every week #4 with no refills and she will follow-up for routine testing of Vitamin D, at least 2-3 times per year to avoid over-replacement.  At risk for diabetes mellitus Good blood sugar control is important to decrease the likelihood of diabetic complications such as nephropathy, neuropathy, limb loss, blindness, coronary artery disease, and death. Intensive lifestyle modification including diet, exercise and weight loss are the first line of treatment for diabetes.   Class 1 obesity with serious comorbidity and body mass index (BMI) of 31.0 to 31.9 in adult, unspecified obesity type Angela [name] is currently in the action stage of change. As such, her goal is to continue with weight loss efforts. She has agreed to the Category 3 Plan.   Behavioral modification strategies: increasing lean protein intake, increasing vegetables, meal planning and cooking strategies, keeping healthy foods in the home and planning for success. [name] will do more on the Category 3 plan for dinner the next few weeks.  Angela [name] has agreed to follow-up with our clinic in 2 to 3 weeks. She was informed of the importance of frequent follow-up visits to maximize her success with intensive lifestyle modifications for her multiple health conditions.   Objective:   Blood pressure 95/65, pulse 95, temperature 98.3 F (36.8 C), temperature source Oral, height 5\' 8"  (1.727 m), weight 204 lb (92.5 kg), last menstrual period 05/21/2019, SpO2 97 %.  Body mass index is 31.02 kg/m.  General: Cooperative, alert, well developed, in no acute distress. HEENT: Conjunctivae and lids unremarkable. Cardiovascular: Regular rhythm.  Lungs: Normal work of breathing. Neurologic: No focal deficits.   Lab Results  Component Value Date   CREATININE 0.76 02/02/2019   BUN 16 02/02/2019   NA 139 02/02/2019   K 4.5 02/02/2019    CL 105 02/02/2019   CO2 21 02/02/2019   Lab Results  Component Value Date   ALT 26 02/02/2019   AST 21 02/02/2019   ALKPHOS 78 02/02/2019   BILITOT 0.3 02/02/2019   Lab Results  Component Value Date   HGBA1C 5.5 05/25/2019   HGBA1C 5.5 02/02/2019   HGBA1C 5.7 (H) 10/05/2018   HGBA1C 5.9 06/22/2018   HGBA1C 5.8 06/21/2017   Lab Results  Component Value Date   INSULIN 24.5 05/25/2019   INSULIN 21.9 02/02/2019   INSULIN 61.1 (H) 10/05/2018   Lab Results  Component Value Date   TSH 2.020 10/05/2018   Lab Results  Component Value Date   CHOL 156 10/05/2018   HDL 61 10/05/2018   LDLCALC 74 10/05/2018   TRIG 106 10/05/2018   CHOLHDL 3 06/22/2018   Lab Results  Component Value Date   WBC 4.8 05/25/2019   HGB 12.3 05/25/2019   HCT 36.9 05/25/2019   MCV 88 05/25/2019   PLT 223 05/25/2019   Lab Results  Component Value Date   IRON 58 05/25/2019   TIBC 319 05/25/2019   FERRITIN 39 05/25/2019    Ref. Range 05/25/2019 08:44  Vitamin D, 25-Hydroxy Latest Ref Range: 30.0 - 100.0 ng/mL 35.6    Attestation Statements:   Reviewed by clinician on day of visit: allergies, medications, problem list, medical history, surgical history, family history, social history, and previous encounter notes.  I, Doreene Nest, am acting as transcriptionist for Coralie Common, MD.  I have reviewed the above documentation for accuracy and completeness, and I agree with the above. - Ilene Qua, MD

## 2019-06-26 MED FILL — FUSION PLUS CAPSULE: 60 days supply | Qty: 30 | Fill #2

## 2019-07-05 ENCOUNTER — Other Ambulatory Visit: Payer: Self-pay

## 2019-07-05 ENCOUNTER — Ambulatory Visit (INDEPENDENT_AMBULATORY_CARE_PROVIDER_SITE_OTHER): Payer: 59 | Admitting: Physician Assistant

## 2019-07-05 VITALS — BP 111/72 | HR 93 | Temp 98.5°F | Ht 68.0 in | Wt 205.0 lb

## 2019-07-05 DIAGNOSIS — E559 Vitamin D deficiency, unspecified: Secondary | ICD-10-CM

## 2019-07-05 DIAGNOSIS — Z6831 Body mass index (BMI) 31.0-31.9, adult: Secondary | ICD-10-CM | POA: Diagnosis not present

## 2019-07-05 DIAGNOSIS — R7303 Prediabetes: Secondary | ICD-10-CM | POA: Diagnosis not present

## 2019-07-05 DIAGNOSIS — Z9189 Other specified personal risk factors, not elsewhere classified: Secondary | ICD-10-CM | POA: Diagnosis not present

## 2019-07-05 DIAGNOSIS — E669 Obesity, unspecified: Secondary | ICD-10-CM | POA: Diagnosis not present

## 2019-07-05 MED ORDER — METFORMIN HCL 500 MG PO TABS: 500.0000 mg | ORAL_TABLET | Freq: Two times a day (BID) | ORAL | 0 refills | Status: DC

## 2019-07-05 MED ORDER — VITAMIN D (ERGOCALCIFEROL) 1.25 MG (50000 UNIT) PO CAPS: ORAL_CAPSULE | ORAL | 0 refills | Status: DC

## 2019-07-05 MED FILL — METFORMIN HCL 500 MG TABS: 500 | 30 days supply | Qty: 60 | Fill #0

## 2019-07-05 MED FILL — VIT D2 1.25 MG (50,000 UNIT: 1.25 MG | 28 days supply | Qty: 4 | Fill #0

## 2019-07-05 MED FILL — LINZESS 145 MCG CAPSULE: 145 | 30 days supply | Qty: 30 | Fill #1

## 2019-07-05 NOTE — Progress Notes (Signed)
Chief Complaint:   OBESITY Angela [name] is here to discuss her progress with her obesity treatment plan along with follow-up of her obesity related diagnoses. Angela [name] is on the Category 3 Plan and journaling 300-500 calories and 40 grams of protein at supper and states she is following her eating plan approximately 85% of the time. Angela [name] states she is walking 15 minutes 3-4 times per week.  Today's visit was #: 31 Starting weight: 220 lbs Starting date: 10/05/2018 Today's weight: 205 lbs Today's date: 07/05/2019 Total lbs lost to date: 15 Total lbs lost since last in-office visit: 0  Interim History: Angela [name] reports a lot of stress at work. She states that she has not been eating out as much. She also has been snacking more in the evenings.  Subjective:   Prediabetes. Angela [name] has a diagnosis of prediabetes based on her elevated HgA1c and was informed this puts her at greater risk of developing diabetes. She continues to work on diet and exercise to decrease her risk of diabetes. She denies nausea or hypoglycemia. Angela [name] is on metformin twice daily. No nausea, vomiting, or diarrhea.  Lab Results  Component Value Date   HGBA1C 5.5 05/25/2019   Lab Results  Component Value Date   INSULIN 24.5 05/25/2019   INSULIN 21.9 02/02/2019   INSULIN 61.1 (H) 10/05/2018   Vitamin D deficiency. Angela [name] is on Vitamin D once weekly. No nausea, vomiting, or muscle weakness.  Last Vitamin D 35.6 on 05/25/2019. She is walking outside a few times a week.  At risk for diabetes mellitus. Angela [name] is at higher than average risk for developing diabetes due to her obesity.   Assessment/Plan:   Prediabetes. Angela [name] will continue to work on weight loss, exercise, and decreasing simple carbohydrates to help decrease the risk of diabetes. She was given a refill on her metFORMIN (GLUCOPHAGE) 500 MG tablet #60 with 0 refills.  Vitamin D deficiency. Low Vitamin D level contributes to fatigue and are  associated with obesity, breast, and colon cancer. She was given a refill on her Vitamin D, Ergocalciferol, (DRISDOL) 1.25 MG (50000 UNIT) CAPS capsule every week #4 with 0 refills and will follow-up for routine testing of Vitamin D, at least 2-3 times per year to avoid over-replacement.   At risk for diabetes mellitus. Angela [name] was given approximately 15 minutes of diabetes education and counseling today. We discussed intensive lifestyle modifications today with an emphasis on weight loss as well as increasing exercise and decreasing simple carbohydrates in her diet. We also reviewed medication options with an emphasis on risk versus benefit of those discussed.   Repetitive spaced learning was employed today to elicit superior memory formation and behavioral change.  Class 1 obesity with serious comorbidity and body mass index (BMI) of 31.0 to 31.9 in adult, unspecified obesity type.  Angela [name] is currently in the action stage of change. As such, her goal is to continue with weight loss efforts. She has agreed to the Category 3 Plan and will journal 450-600 calories and 40 grams of protein at supper.   Exercise goals: For substantial health benefits, adults should do at least 150 minutes (2 hours and 30 minutes) a week of moderate-intensity, or 75 minutes (1 hour and 15 minutes) a week of vigorous-intensity aerobic physical activity, or an equivalent combination of moderate- and vigorous-intensity aerobic activity. Aerobic activity should be performed in episodes of at least 10 minutes, and preferably, it should be spread throughout the week.  Behavioral modification strategies: meal planning  and cooking strategies and better snacking choices.  Angela [name] has agreed to follow-up with our clinic in 3 weeks. She was informed of the importance of frequent follow-up visits to maximize her success with intensive lifestyle modifications for her multiple health conditions.   Objective:   Blood pressure 111/72,  pulse 93, temperature 98.5 F (36.9 C), temperature source Oral, height 5\' 8"  (1.727 m), weight 205 lb (93 kg), last menstrual period 06/23/2019, SpO2 99 %. Body mass index is 31.17 kg/m.  General: Cooperative, alert, well developed, in no acute distress. HEENT: Conjunctivae and lids unremarkable. Cardiovascular: Regular rhythm.  Lungs: Normal work of breathing. Neurologic: No focal deficits.   Lab Results  Component Value Date   CREATININE 0.76 02/02/2019   BUN 16 02/02/2019   NA 139 02/02/2019   K 4.5 02/02/2019   CL 105 02/02/2019   CO2 21 02/02/2019   Lab Results  Component Value Date   ALT 26 02/02/2019   AST 21 02/02/2019   ALKPHOS 78 02/02/2019   BILITOT 0.3 02/02/2019   Lab Results  Component Value Date   HGBA1C 5.5 05/25/2019   HGBA1C 5.5 02/02/2019   HGBA1C 5.7 (H) 10/05/2018   HGBA1C 5.9 06/22/2018   HGBA1C 5.8 06/21/2017   Lab Results  Component Value Date   INSULIN 24.5 05/25/2019   INSULIN 21.9 02/02/2019   INSULIN 61.1 (H) 10/05/2018   Lab Results  Component Value Date   TSH 2.020 10/05/2018   Lab Results  Component Value Date   CHOL 156 10/05/2018   HDL 61 10/05/2018   LDLCALC 74 10/05/2018   TRIG 106 10/05/2018   CHOLHDL 3 06/22/2018   Lab Results  Component Value Date   WBC 4.8 05/25/2019   HGB 12.3 05/25/2019   HCT 36.9 05/25/2019   MCV 88 05/25/2019   PLT 223 05/25/2019   Lab Results  Component Value Date   IRON 58 05/25/2019   TIBC 319 05/25/2019   FERRITIN 39 05/25/2019   Attestation Statements:   Reviewed by clinician on day of visit: allergies, medications, problem list, medical history, surgical history, family history, social history, and previous encounter notes.  IMichaelene Song, am acting as transcriptionist for [name], PA-C   I have reviewed the above documentation for accuracy and completeness, and I agree with the above. [name], PA-C

## 2019-07-26 ENCOUNTER — Encounter (INDEPENDENT_AMBULATORY_CARE_PROVIDER_SITE_OTHER): Payer: Self-pay | Admitting: Physician Assistant

## 2019-07-26 ENCOUNTER — Ambulatory Visit (INDEPENDENT_AMBULATORY_CARE_PROVIDER_SITE_OTHER): Payer: 59 | Admitting: Physician Assistant

## 2019-07-26 ENCOUNTER — Other Ambulatory Visit: Payer: Self-pay

## 2019-07-26 VITALS — BP 116/74 | HR 79 | Temp 98.5°F | Ht 68.0 in | Wt 204.0 lb

## 2019-07-26 DIAGNOSIS — E559 Vitamin D deficiency, unspecified: Secondary | ICD-10-CM

## 2019-07-26 DIAGNOSIS — E669 Obesity, unspecified: Secondary | ICD-10-CM | POA: Diagnosis not present

## 2019-07-26 DIAGNOSIS — Z6831 Body mass index (BMI) 31.0-31.9, adult: Secondary | ICD-10-CM

## 2019-07-26 DIAGNOSIS — R7303 Prediabetes: Secondary | ICD-10-CM | POA: Diagnosis not present

## 2019-07-26 DIAGNOSIS — Z9189 Other specified personal risk factors, not elsewhere classified: Secondary | ICD-10-CM | POA: Diagnosis not present

## 2019-07-26 MED ORDER — VITAMIN D (ERGOCALCIFEROL) 1.25 MG (50000 UNIT) PO CAPS: ORAL_CAPSULE | ORAL | 0 refills | Status: DC

## 2019-07-26 MED ORDER — METFORMIN HCL 500 MG PO TABS: 500.0000 mg | ORAL_TABLET | Freq: Two times a day (BID) | ORAL | 0 refills | Status: DC

## 2019-07-26 NOTE — Progress Notes (Signed)
Chief Complaint:   OBESITY Angela [name] is here to discuss her progress with her obesity treatment plan along with follow-up of her obesity related diagnoses. Angela [name] is on the Category 3 Plan and states she is following her eating plan approximately 90% of the time. Angela [name] states she is walking/high intensity workout 10-45 minutes 3 times per week.  Today's visit was #: 72 Starting weight: 220 lbs Starting date: 10/05/2018 Today's weight: 204 lbs Today's date: 07/26/2019 Total lbs lost to date: 16 Total lbs lost since last in-office visit: 1  Interim History: Angela [name] did well with weight loss. She is hungry between lunch and dinner. Next week she is traveling to [facility] for her anniversary.  Subjective:   Vitamin D deficiency. Angela [name] is on prescription Vitamin D. No nausea, vomiting, or muscle weakness. Last Vitamin D 35.6 on 05/25/2019.  Prediabetes. Angela [name] has a diagnosis of prediabetes based on her elevated HgA1c and was informed this puts her at greater risk of developing diabetes. She continues to work on diet and exercise to decrease her risk of diabetes. She denies nausea, vomiting, or diarrhea. No polyphagia. Angela [name] is on metformin.   Lab Results  Component Value Date   HGBA1C 5.5 05/25/2019   Lab Results  Component Value Date   INSULIN 24.5 05/25/2019   INSULIN 21.9 02/02/2019   INSULIN 61.1 (H) 10/05/2018   At risk for osteoporosis. Angela [name] is at higher risk of osteopenia and osteoporosis due to Vitamin D deficiency.   Assessment/Plan:   Vitamin D deficiency. Low Vitamin D level contributes to fatigue and are associated with obesity, breast, and colon cancer. She was given a refill on her Vitamin D, Ergocalciferol, (DRISDOL) 1.25 MG (50000 UNIT) CAPS capsule every week #4 with 0 refills and will follow-up for routine testing of Vitamin D, at least 2-3 times per year to avoid over-replacement.    Prediabetes. Angela [name] will continue to work on weight loss,  exercise, and decreasing simple carbohydrates to help decrease the risk of diabetes. She was given a refill on her metFORMIN (GLUCOPHAGE) 500 MG tablet #30 with 0 refills.  At risk for osteoporosis. Angela [name] was given approximately 15 minutes of osteoporosis prevention counseling today. Angela [name] is at risk for osteopenia and osteoporosis due to her Vitamin D deficiency. She was encouraged to take her Vitamin D and follow her higher calcium diet and increase strengthening exercise to help strengthen her bones and decrease her risk of osteopenia and osteoporosis.  Repetitive spaced learning was employed today to elicit superior memory formation and behavioral change.  Class 1 obesity with serious comorbidity and body mass index (BMI) of 31.0 to 31.9 in adult, unspecified obesity type.  Angela [name] is currently in the action stage of change. As such, her goal is to continue with weight loss efforts. She has agreed to the Category 3 Plan.   Exercise goals: For substantial health benefits, adults should do at least 150 minutes (2 hours and 30 minutes) a week of moderate-intensity, or 75 minutes (1 hour and 15 minutes) a week of vigorous-intensity aerobic physical activity, or an equivalent combination of moderate- and vigorous-intensity aerobic activity. Aerobic activity should be performed in episodes of at least 10 minutes, and preferably, it should be spread throughout the week.  Behavioral modification strategies: meal planning and cooking strategies, keeping healthy foods in the home and travel eating strategies.  Angela [name] has agreed to follow-up with our clinic in 3-4 weeks. She was informed of the importance of frequent follow-up visits to maximize  her success with intensive lifestyle modifications for her multiple health conditions.   Objective:   Blood pressure 116/74, pulse 79, temperature 98.5 F (36.9 C), temperature source Oral, height 5\' 8"  (1.727 m), weight 204 lb (92.5 kg), last menstrual period  07/18/2019, SpO2 98 %. Body mass index is 31.02 kg/m.  General: Cooperative, alert, well developed, in no acute distress. HEENT: Conjunctivae and lids unremarkable. Cardiovascular: Regular rhythm.  Lungs: Normal work of breathing. Neurologic: No focal deficits.   Lab Results  Component Value Date   CREATININE 0.76 02/02/2019   BUN 16 02/02/2019   NA 139 02/02/2019   K 4.5 02/02/2019   CL 105 02/02/2019   CO2 21 02/02/2019   Lab Results  Component Value Date   ALT 26 02/02/2019   AST 21 02/02/2019   ALKPHOS 78 02/02/2019   BILITOT 0.3 02/02/2019   Lab Results  Component Value Date   HGBA1C 5.5 05/25/2019   HGBA1C 5.5 02/02/2019   HGBA1C 5.7 (H) 10/05/2018   HGBA1C 5.9 06/22/2018   HGBA1C 5.8 06/21/2017   Lab Results  Component Value Date   INSULIN 24.5 05/25/2019   INSULIN 21.9 02/02/2019   INSULIN 61.1 (H) 10/05/2018   Lab Results  Component Value Date   TSH 2.020 10/05/2018   Lab Results  Component Value Date   CHOL 156 10/05/2018   HDL 61 10/05/2018   LDLCALC 74 10/05/2018   TRIG 106 10/05/2018   CHOLHDL 3 06/22/2018   Lab Results  Component Value Date   WBC 4.8 05/25/2019   HGB 12.3 05/25/2019   HCT 36.9 05/25/2019   MCV 88 05/25/2019   PLT 223 05/25/2019   Lab Results  Component Value Date   IRON 58 05/25/2019   TIBC 319 05/25/2019   FERRITIN 39 05/25/2019   Attestation Statements:   Reviewed by clinician on day of visit: allergies, medications, problem list, medical history, surgical history, family history, social history, and previous encounter notes.  IMichaelene [name], am acting as transcriptionist for Angela Potash, PA-C   I have reviewed the above documentation for accuracy and completeness, and I agree with the above. Angela Potash, PA-C

## 2019-08-02 MED FILL — VIT D2 1.25 MG (50,000 UNIT: 1.25 MG | 28 days supply | Qty: 4 | Fill #0

## 2019-08-02 MED FILL — LINZESS 145 MCG CAPSULE: 145 | 30 days supply | Qty: 30 | Fill #2

## 2019-08-02 MED FILL — METFORMIN HCL 500 MG TABS: 500 | 30 days supply | Qty: 60 | Fill #0

## 2019-08-14 MED FILL — COMBIGAN EYE DROPS: 0.2-0.5 | 30 days supply | Qty: 5 | Fill #3

## 2019-08-23 ENCOUNTER — Ambulatory Visit (INDEPENDENT_AMBULATORY_CARE_PROVIDER_SITE_OTHER): Payer: 59 | Admitting: Family Medicine

## 2019-08-23 ENCOUNTER — Ambulatory Visit (INDEPENDENT_AMBULATORY_CARE_PROVIDER_SITE_OTHER): Payer: 59 | Admitting: Primary Care

## 2019-08-23 ENCOUNTER — Other Ambulatory Visit: Payer: Self-pay

## 2019-08-23 ENCOUNTER — Encounter: Payer: Self-pay | Admitting: Primary Care

## 2019-08-23 VITALS — BP 126/82 | HR 75 | Temp 96.2°F | Ht 68.0 in | Wt 208.8 lb

## 2019-08-23 DIAGNOSIS — Z0000 Encounter for general adult medical examination without abnormal findings: Secondary | ICD-10-CM | POA: Diagnosis not present

## 2019-08-23 DIAGNOSIS — E569 Vitamin deficiency, unspecified: Secondary | ICD-10-CM | POA: Diagnosis not present

## 2019-08-23 DIAGNOSIS — Z800 Family history of malignant neoplasm of digestive organs: Secondary | ICD-10-CM

## 2019-08-23 DIAGNOSIS — H409 Unspecified glaucoma: Secondary | ICD-10-CM | POA: Diagnosis not present

## 2019-08-23 DIAGNOSIS — R7303 Prediabetes: Secondary | ICD-10-CM | POA: Diagnosis not present

## 2019-08-23 DIAGNOSIS — D509 Iron deficiency anemia, unspecified: Secondary | ICD-10-CM | POA: Diagnosis not present

## 2019-08-23 DIAGNOSIS — N920 Excessive and frequent menstruation with regular cycle: Secondary | ICD-10-CM

## 2019-08-23 DIAGNOSIS — K5909 Other constipation: Secondary | ICD-10-CM

## 2019-08-23 MED ORDER — LINACLOTIDE 145 MCG PO CAPS: ORAL_CAPSULE | ORAL | 0 refills | Status: DC

## 2019-08-23 NOTE — Progress Notes (Signed)
Subjective:    Patient ID: Angela [name], female    DOB: 05/04/1975, 44 y.o.   MRN: [id number]  HPI  This visit occurred during the SARS-CoV-2 public health emergency.  Safety protocols were in place, including screening questions prior to the visit, additional usage of staff PPE, and extensive cleaning of exam room while observing appropriate contact time as indicated for disinfecting solutions.   Angela [name] is a 44 year old female who presents today for complete physical.  Immunizations: -Tetanus: Completed in 2016 -Influenza: Completed this season  -Covid-19: Completed series  Diet: She endorses a healthy diet.  Exercise: She is boxing once weekly, some walking during the week.   Eye exam: Completes regularly for glaucoma Dental exam: Completes semi-annually   Pap Smear: Completed in January 2020 Mammogram: Completed in June 2020  BP Readings from Last 3 Encounters:  08/23/19 126/82  07/26/19 116/74  07/05/19 111/72   Wt Readings from Last 3 Encounters:  08/23/19 208 lb 12 oz (94.7 kg)  07/26/19 204 lb (92.5 kg)  07/05/19 205 lb (93 kg)     Review of Systems  Constitutional: Negative for unexpected weight change.  HENT: Negative for rhinorrhea.   Respiratory: Negative for cough and shortness of breath.   Cardiovascular: Negative for chest pain.  Gastrointestinal: Negative for diarrhea.  Genitourinary: Positive for menstrual problem. Negative for difficulty urinating.  Musculoskeletal: Negative for arthralgias and myalgias.  Skin: Negative for rash.  Allergic/Immunologic: Negative for environmental allergies.  Neurological: Negative for dizziness and headaches.  Psychiatric/Behavioral: The patient is not nervous/anxious.        Past Medical History:  Diagnosis Date  . Allergy Enviromental  . Anemia may/2017  . Asthma    as a child  . Back pain gerd  . Constipation   . Dysuria 11/25/2018  . Glaucoma   . Heart murmur childhood  . IBS  (irritable bowel syndrome)   . Joint pain   . Menorrhagia   . Palpitations   . Plantar fasciitis   . Prediabetes   . Swelling    feet, legs  . Torn meniscus    Right  . Vaginal burning 11/25/2018     Social History   Socioeconomic History  . Marital status: Married    Spouse name: Financial planner  . Number of children: 0  . Years of education: Not on file  . Highest education level: Not on file  Occupational History  . Occupation: Agricultural consultant: [employer]  Tobacco Use  . Smoking status: Never Smoker  . Smokeless tobacco: Never Used  Substance and Sexual Activity  . Alcohol use: Yes    Alcohol/week: 2.0 standard drinks    Types: 2 Glasses of wine per week    Comment: occasional  . Drug use: No  . Sexual activity: Yes    Birth control/protection: None  Other Topics Concern  . Not on file  Social History Narrative   Married.   No children.   Works in the [employer].   Enjoys watching movies, walking, reading.    Social Determinants of Health   Financial Resource Strain:   . Difficulty of Paying Living Expenses:   Food Insecurity:   . Worried About Charity fundraiser in the Last Year:   . Arboriculturist in the Last Year:   Transportation Needs:   . Film/video editor (Medical):   Marland Kitchen Lack of Transportation (Non-Medical):   Physical Activity:   . Days  of Exercise per Week:   . Minutes of Exercise per Session:   Stress:   . Feeling of Stress :   Social Connections:   . Frequency of Communication with Friends and Family:   . Frequency of Social Gatherings with Friends and Family:   . Attends Religious Services:   . Active Member of Clubs or Organizations:   . Attends Archivist Meetings:   Marland Kitchen Marital Status:   Intimate Partner Violence:   . Fear of Current or Ex-Partner:   . Emotionally Abused:   Marland Kitchen Physically Abused:   . Sexually Abused:     Past Surgical History:  Procedure Laterality Date  . CHOLECYSTECTOMY  2005   . COLONOSCOPY WITH PROPOFOL N/A 10/27/2018   Procedure: COLONOSCOPY WITH PROPOFOL;  Surgeon: Lin Landsman, MD;  Location[facility] ENDOSCOPY;  Service: Gastroenterology;  Laterality: N/A;  . DILATATION & CURETTAGE/HYSTEROSCOPY WITH MYOSURE N/A 06/23/2018   Procedure: DILATATION & CURETTAGE/HYSTEROSCOPY WITH MYOSURE;  Surgeon: Will Bonnet, MD;  Location: [facility] ORS;  Service: Gynecology;  Laterality: N/A;  . DILATION AND CURETTAGE OF UTERUS    . ERCP     3 times  . ESOPHAGOGASTRODUODENOSCOPY (EGD) WITH PROPOFOL  10/27/2018   Procedure: ESOPHAGOGASTRODUODENOSCOPY (EGD) WITH PROPOFOL;  Surgeon: Lin Landsman, MD;  Location: [facility] ENDOSCOPY;  Service: Gastroenterology;;  . EYE SURGERY    . MENISCUS REPAIR Right    x 2    Family History  Problem Relation Age of Onset  . Colon cancer Mother 81  . Diabetes Mother   . Hypertension Mother   . Arthritis Mother   . Transient ischemic attack Mother   . Liver disease Mother   . Obesity Mother   . Diabetes Father   . Heart disease Father   . Esophageal cancer Maternal Grandmother   . Cancer Maternal Grandmother   . Diabetes Maternal Grandmother   . Alcohol abuse Maternal Grandfather   . Breast cancer Maternal Aunt        mat great aunt    Allergies  Allergen Reactions  . Quinoa-Kale-Hemp [Alimentum]     Current Outpatient Medications on File Prior to Visit  Medication Sig Dispense Refill  . atropine 1 % ophthalmic solution Place 1 drop into the right eye daily.    . brimonidine-timolol (COMBIGAN) 0.2-0.5 % ophthalmic solution Place 1 drop into the right eye every 12 (twelve) hours.     . cetirizine (ZYRTEC) 10 MG tablet Take 10 mg by mouth daily as needed for allergies.    Marland Kitchen ibuprofen (ADVIL,MOTRIN) 600 MG tablet Take 1 tablet (600 mg total) by mouth every 6 (six) hours as needed for mild pain, moderate pain or cramping. 30 tablet 0  . Iron-FA-B Cmp-C-Biot-Probiotic (FUSION PLUS) CAPS     . LINZESS 145 MCG CAPS capsule TAKE 1  CAPSULE BY MOUTH ONCE DAILY BEFORE BREAKFAST 30 capsule 2  . metFORMIN (GLUCOPHAGE) 500 MG tablet Take 1 tablet (500 mg total) by mouth 2 (two) times daily with a meal. 60 tablet 0  . psyllium (METAMUCIL) 58.6 % packet Take 1 packet by mouth daily.    . Vitamin D, Ergocalciferol, (DRISDOL) 1.25 MG (50000 UNIT) CAPS capsule TAKE 1 CAPSULE BY MOUTH EVERY 7 DAYS 4 capsule 0   No current facility-administered medications on file prior to visit.    BP 126/82   Pulse 75   Temp (!) 96.2 F (35.7 C) (Temporal)   Ht 5\' 8"  (1.727 m)   Wt 208 lb 12 oz (  94.7 kg)   SpO2 98%   BMI 31.74 kg/m    Objective:   Physical Exam  Constitutional: She is oriented to person, place, and time. She appears well-nourished.  HENT:  Right Ear: Tympanic membrane and ear canal normal.  Left Ear: Tympanic membrane and ear canal normal.  Mouth/Throat: Oropharynx is clear and moist.  Eyes: Pupils are equal, round, and reactive to light. EOM are normal.  Cardiovascular: Normal rate and regular rhythm.  Respiratory: Effort normal and breath sounds normal.  GI: Soft. Bowel sounds are normal. There is no abdominal tenderness.  Musculoskeletal:        General: Normal range of motion.     Cervical back: Neck supple.  Neurological: She is alert and oriented to person, place, and time. No cranial nerve deficit.  Reflex Scores:      Patellar reflexes are 2+ on the right side and 2+ on the left side. Skin: Skin is warm and dry.  Psychiatric: She has a normal mood and affect.           Assessment & Plan:

## 2019-08-23 NOTE — Assessment & Plan Note (Signed)
Secondary to menorrhagia during regular cycles, following with GYN.

## 2019-08-23 NOTE — Assessment & Plan Note (Signed)
Continued, following with GYN.

## 2019-08-23 NOTE — Assessment & Plan Note (Signed)
Immunizations UTD. Pap smear UTD, follows with GYN. Mammogram UTD, due in Summer 2021. Colonoscopy UTD, due again in 2023.  Commended her on efforts of weight loss.  Exam today unremarkable. Labs reviewed.

## 2019-08-23 NOTE — Assessment & Plan Note (Signed)
Following with GI, next colonoscopy due in 2023.

## 2019-08-23 NOTE — Patient Instructions (Signed)
Continue exercising. You should be getting 150 minutes of moderate intensity exercise weekly.  Continue to work on a healthy diet. Ensure you are consuming 64 ounces of water daily.  It was a pleasure to see you today!   Preventive Care 45-45 Years Old, Female Preventive care refers to visits with your health care provider and lifestyle choices that can promote health and wellness. This includes:  A yearly physical exam. This may also be called an annual well check.  Regular dental visits and eye exams.  Immunizations.  Screening for certain conditions.  Healthy lifestyle choices, such as eating a healthy diet, getting regular exercise, not using drugs or products that contain nicotine and tobacco, and limiting alcohol use. What can I expect for my preventive care visit? Physical exam Your health care provider will check your:  Height and weight. This may be used to calculate body mass index (BMI), which tells if you are at a healthy weight.  Heart rate and blood pressure.  Skin for abnormal spots. Counseling Your health care provider may ask you questions about your:  Alcohol, tobacco, and drug use.  Emotional well-being.  Home and relationship well-being.  Sexual activity.  Eating habits.  Work and work Statistician.  Method of birth control.  Menstrual cycle.  Pregnancy history. What immunizations do I need?  Influenza (flu) vaccine  This is recommended every year. Tetanus, diphtheria, and pertussis (Tdap) vaccine  You may need a Td booster every 10 years. Varicella (chickenpox) vaccine  You may need this if you have not been vaccinated. Zoster (shingles) vaccine  You may need this after age 45. Measles, mumps, and rubella (MMR) vaccine  You may need at least one dose of MMR if you were born in 1957 or later. You may also need a second dose. Pneumococcal conjugate (PCV13) vaccine  You may need this if you have certain conditions and were not  previously vaccinated. Pneumococcal polysaccharide (PPSV23) vaccine  You may need one or two doses if you smoke cigarettes or if you have certain conditions. Meningococcal conjugate (MenACWY) vaccine  You may need this if you have certain conditions. Hepatitis A vaccine  You may need this if you have certain conditions or if you travel or work in places where you may be exposed to hepatitis A. Hepatitis B vaccine  You may need this if you have certain conditions or if you travel or work in places where you may be exposed to hepatitis B. Haemophilus influenzae type b (Hib) vaccine  You may need this if you have certain conditions. Human papillomavirus (HPV) vaccine  If recommended by your health care provider, you may need three doses over 6 months. You may receive vaccines as individual doses or as more than one vaccine together in one shot (combination vaccines). Talk with your health care provider about the risks and benefits of combination vaccines. What tests do I need? Blood tests  Lipid and cholesterol levels. These may be checked every 5 years, or more frequently if you are over 45 years old.  Hepatitis C test.  Hepatitis B test. Screening  Lung cancer screening. You may have this screening every year starting at age 45 if you have a 30-pack-year history of smoking and currently smoke or have quit within the past 15 years.  Colorectal cancer screening. All adults should have this screening starting at age 45 and continuing until age 39. Your health care provider may recommend screening at age 45 if you are at increased risk. You  will have tests every 1-10 years, depending on your results and the type of screening test.  Diabetes screening. This is done by checking your blood sugar (glucose) after you have not eaten for a while (fasting). You may have this done every 1-3 years.  Mammogram. This may be done every 1-2 years. Talk with your health care provider about when you  should start having regular mammograms. This may depend on whether you have a family history of breast cancer.  BRCA-related cancer screening. This may be done if you have a family history of breast, ovarian, tubal, or peritoneal cancers.  Pelvic exam and Pap test. This may be done every 3 years starting at age 45. Starting at age 45, this may be done every 5 years if you have a Pap test in combination with an HPV test. if you have a Pap test in combination with an HPV test. Other tests  Sexually transmitted disease (STD) testing.  Bone density scan. This is done to screen for osteoporosis. You may have this scan if you are at high risk for osteoporosis. Follow these instructions at home: Eating and drinking  Eat a diet that includes fresh fruits and vegetables, whole grains, lean protein, and low-fat dairy.  Take vitamin and mineral supplements as recommended by your health care provider.  Do not drink alcohol if: ? Your health care provider tells you not to drink. ? You are pregnant, may be pregnant, or are planning to become pregnant.  If you drink alcohol: ? Limit how much you have to 0-1 drink a day. ? Be aware of how much alcohol is in your drink. In the [place] one drink equals one 12 oz bottle of beer (355 mL), one 5 oz glass of wine (148 mL), or one 1 oz glass of hard liquor (44 mL). Lifestyle  Take daily care of your teeth and gums.  Stay active. Exercise for at least 30 minutes on 5 or more days each week.  Do not use any products that contain nicotine or tobacco, such as cigarettes, e-cigarettes, and chewing tobacco. If you need help quitting, ask your health care provider.  If you are sexually active, practice safe sex. Use a condom or other form of birth control (contraception) in order to prevent pregnancy and STIs (sexually transmitted infections).  If told by your health care provider, take low-dose aspirin daily starting at age 45. What's next?  Visit your health care provider once a year for a well check  visit.  Ask your health care provider how often you should have your eyes and teeth checked.  Stay up to date on all vaccines. This information is not intended to replace advice given to you by your health care provider. Make sure you discuss any questions you have with your health care provider. Document Revised: 12/16/2017 Document Reviewed: 12/16/2017 Elsevier Patient Education  2020 [employer].

## 2019-08-23 NOTE — Assessment & Plan Note (Signed)
Managed on metformin 500 mg BID, [facility]. Commended her on weight loss. Continue metformin.

## 2019-08-23 NOTE — Assessment & Plan Note (Addendum)
Well managed on Linzess for which she takes daily for the most part. Also decreased oral iron. Following with GI.   Due for repeat colonoscopy in 2023.

## 2019-08-23 NOTE — Assessment & Plan Note (Signed)
Following with ophthalmology, continue drops °

## 2019-08-23 NOTE — Telephone Encounter (Signed)
Updated in her records

## 2019-08-23 NOTE — Assessment & Plan Note (Signed)
Compliant to vitamin D 50,000 units weekly, prescribed by [facility].

## 2019-08-27 ENCOUNTER — Encounter (INDEPENDENT_AMBULATORY_CARE_PROVIDER_SITE_OTHER): Payer: Self-pay | Admitting: Family Medicine

## 2019-08-28 DIAGNOSIS — H4031X3 Glaucoma secondary to eye trauma, right eye, severe stage: Secondary | ICD-10-CM | POA: Diagnosis not present

## 2019-08-28 DIAGNOSIS — H44511 Absolute glaucoma, right eye: Secondary | ICD-10-CM | POA: Diagnosis not present

## 2019-09-06 ENCOUNTER — Other Ambulatory Visit: Payer: Self-pay | Admitting: Obstetrics and Gynecology

## 2019-09-06 ENCOUNTER — Other Ambulatory Visit: Payer: Self-pay | Admitting: Primary Care

## 2019-09-06 DIAGNOSIS — Z1231 Encounter for screening mammogram for malignant neoplasm of breast: Secondary | ICD-10-CM

## 2019-09-07 ENCOUNTER — Other Ambulatory Visit: Payer: Self-pay

## 2019-09-07 ENCOUNTER — Encounter (INDEPENDENT_AMBULATORY_CARE_PROVIDER_SITE_OTHER): Payer: Self-pay | Admitting: Family Medicine

## 2019-09-07 ENCOUNTER — Ambulatory Visit (INDEPENDENT_AMBULATORY_CARE_PROVIDER_SITE_OTHER): Payer: 59 | Admitting: Family Medicine

## 2019-09-07 VITALS — BP 124/79 | HR 84 | Temp 98.4°F | Ht 68.0 in | Wt 203.0 lb

## 2019-09-07 DIAGNOSIS — E559 Vitamin D deficiency, unspecified: Secondary | ICD-10-CM | POA: Diagnosis not present

## 2019-09-07 DIAGNOSIS — Z6831 Body mass index (BMI) 31.0-31.9, adult: Secondary | ICD-10-CM

## 2019-09-07 DIAGNOSIS — Z9189 Other specified personal risk factors, not elsewhere classified: Secondary | ICD-10-CM | POA: Diagnosis not present

## 2019-09-07 DIAGNOSIS — R7303 Prediabetes: Secondary | ICD-10-CM | POA: Diagnosis not present

## 2019-09-07 DIAGNOSIS — E66811 Obesity, class 1: Secondary | ICD-10-CM

## 2019-09-07 DIAGNOSIS — E669 Obesity, unspecified: Secondary | ICD-10-CM | POA: Diagnosis not present

## 2019-09-07 MED ORDER — METFORMIN HCL 500 MG PO TABS: 500.0000 mg | ORAL_TABLET | Freq: Two times a day (BID) | ORAL | 0 refills | Status: DC

## 2019-09-07 MED ORDER — VITAMIN D (ERGOCALCIFEROL) 1.25 MG (50000 UNIT) PO CAPS: ORAL_CAPSULE | ORAL | 0 refills | Status: DC

## 2019-09-11 NOTE — Progress Notes (Signed)
Chief Complaint:   OBESITY Angela [name] is here to discuss her progress with her obesity treatment plan along with follow-up of her obesity related diagnoses. Angela [name] is on the Category 3 Plan and states she is following her eating plan approximately 80-85% of the time. Angela [name] states she is walking for 15 minutes 1 time per week and boxing for 45 minutes 2 times per week.  Today's visit was #: 19 Starting weight: 220 lbs Starting date: 10/05/2018 Today's weight: 203 lbs Today's date: 09/07/2019 Total lbs lost to date: 17 lbs Total lbs lost since last in-office visit: 1 lb  Interim History: Angela [name] says that she is following the plan closely unless she is grilling, and then she is following recipes.  Denies hunger unless too much time passes between meals.  She is using 2 ounce protein substitutes occasionally.  She will be going to [facility] for [place] Day weekend.  Subjective:   1. Prediabetes Angela [name] has a diagnosis of prediabetes based on her elevated HgA1c and was informed this puts her at greater risk of developing diabetes. She continues to work on diet and exercise to decrease her risk of diabetes. She denies nausea or hypoglycemia.  She is taking metformin 500 mg twice daily.  Lab Results  Component Value Date   HGBA1C 5.5 05/25/2019   Lab Results  Component Value Date   INSULIN 24.5 05/25/2019   INSULIN 21.9 02/02/2019   INSULIN 61.1 (H) 10/05/2018   2. Vitamin D deficiency Angela [name]'s Vitamin D level was 35.6 on 05/25/2019. She is currently taking prescription vitamin D 50,000 IU each week. She denies nausea, vomiting or muscle weakness.  She endorses fatigue.  3. At risk for osteoporosis Angela [name] is at higher risk of osteopenia and osteoporosis due to Vitamin D deficiency.   Assessment/Plan:   1. Prediabetes Angela [name] will continue to work on weight loss, exercise, and decreasing simple carbohydrates to help decrease the risk of diabetes.  - metFORMIN (GLUCOPHAGE) 500 MG tablet;  Take 1 tablet (500 mg total) by mouth 2 (two) times daily with a meal.  Dispense: 60 tablet; Refill: 0  2. Vitamin D deficiency Low Vitamin D level contributes to fatigue and are associated with obesity, breast, and colon cancer. She agrees to continue to take prescription Vitamin D @50 ,000 IU every week and will follow-up for routine testing of Vitamin D, at least 2-3 times per year to avoid over-replacement. - Vitamin D, Ergocalciferol, (DRISDOL) 1.25 MG (50000 UNIT) CAPS capsule; TAKE 1 CAPSULE BY MOUTH EVERY 7 DAYS  Dispense: 4 capsule; Refill: 0  3. At risk for osteoporosis Angela [name] was given approximately 15 minutes of osteoporosis prevention counseling today. Angela [name] is at risk for osteopenia and osteoporosis due to her Vitamin D deficiency. She was encouraged to take her Vitamin D and follow her higher calcium diet and increase strengthening exercise to help strengthen her bones and decrease her risk of osteopenia and osteoporosis.  Repetitive spaced learning was employed today to elicit superior memory formation and behavioral change.  4. Class 1 obesity with serious comorbidity and body mass index (BMI) of 31.0 to 31.9 in adult, unspecified obesity type Angela [name] is currently in the action stage of change. As such, her goal is to continue with weight loss efforts. She has agreed to the Category 3 Plan.   Exercise goals: Increase resistance training to 3 times weekly for 15-20 minutes.  Behavioral modification strategies: increasing lean protein intake, increasing vegetables, meal planning and cooking strategies, keeping healthy foods in the  home and planning for success.  Angela [name] has agreed to follow-up with our clinic in 4 weeks. She was informed of the importance of frequent follow-up visits to maximize her success with intensive lifestyle modifications for her multiple health conditions.   Objective:   Blood pressure 124/79, pulse 84, temperature 98.4 F (36.9 C), temperature source  Oral, height 5\' 8"  (1.727 m), weight 203 lb (92.1 kg), last menstrual period 08/23/2019, SpO2 95 %. Body mass index is 30.87 kg/m.  General: Cooperative, alert, well developed, in no acute distress. HEENT: Conjunctivae and lids unremarkable. Cardiovascular: Regular rhythm.  Lungs: Normal work of breathing. Neurologic: No focal deficits.   Lab Results  Component Value Date   CREATININE 0.76 02/02/2019   BUN 16 02/02/2019   NA 139 02/02/2019   K 4.5 02/02/2019   CL 105 02/02/2019   CO2 21 02/02/2019   Lab Results  Component Value Date   ALT 26 02/02/2019   AST 21 02/02/2019   ALKPHOS 78 02/02/2019   BILITOT 0.3 02/02/2019   Lab Results  Component Value Date   HGBA1C 5.5 05/25/2019   HGBA1C 5.5 02/02/2019   HGBA1C 5.7 (H) 10/05/2018   HGBA1C 5.9 06/22/2018   HGBA1C 5.8 06/21/2017   Lab Results  Component Value Date   INSULIN 24.5 05/25/2019   INSULIN 21.9 02/02/2019   INSULIN 61.1 (H) 10/05/2018   Lab Results  Component Value Date   TSH 2.020 10/05/2018   Lab Results  Component Value Date   CHOL 156 10/05/2018   HDL 61 10/05/2018   LDLCALC 74 10/05/2018   TRIG 106 10/05/2018   CHOLHDL 3 06/22/2018   Lab Results  Component Value Date   WBC 4.8 05/25/2019   HGB 12.3 05/25/2019   HCT 36.9 05/25/2019   MCV 88 05/25/2019   PLT 223 05/25/2019   Lab Results  Component Value Date   IRON 58 05/25/2019   TIBC 319 05/25/2019   FERRITIN 39 05/25/2019   Attestation Statements:   Reviewed by clinician on day of visit: allergies, medications, problem list, medical history, surgical history, family history, social history, and previous encounter notes.  I, Water quality scientist, CMA, am acting as transcriptionist for Coralie Common, MD.  I have reviewed the above documentation for accuracy and completeness, and I agree with the above. - Jinny Blossom, MD

## 2019-09-12 MED FILL — ATROPINE 1% EYE DROPS: 1 | 90 days supply | Qty: 5 | Fill #2

## 2019-09-27 ENCOUNTER — Encounter: Payer: Self-pay | Admitting: Obstetrics and Gynecology

## 2019-09-27 ENCOUNTER — Other Ambulatory Visit: Payer: Self-pay

## 2019-09-27 ENCOUNTER — Ambulatory Visit (INDEPENDENT_AMBULATORY_CARE_PROVIDER_SITE_OTHER): Payer: 59 | Admitting: Obstetrics and Gynecology

## 2019-09-27 VITALS — BP 113/79 | HR 92 | Ht 68.0 in | Wt 211.0 lb

## 2019-09-27 DIAGNOSIS — Z1331 Encounter for screening for depression: Secondary | ICD-10-CM | POA: Diagnosis not present

## 2019-09-27 DIAGNOSIS — Z1339 Encounter for screening examination for other mental health and behavioral disorders: Secondary | ICD-10-CM

## 2019-09-27 DIAGNOSIS — Z01419 Encounter for gynecological examination (general) (routine) without abnormal findings: Secondary | ICD-10-CM

## 2019-09-27 NOTE — Progress Notes (Signed)
Gynecology Annual Exam  PCP: Pleas Koch, NP  Chief Complaint  Patient presents with  . Gynecologic Exam    Right breast spot, itches   History of Present Illness:  [name] is a 44 y.o. G0P0000 who LMP was Patient's last menstrual period was 09/10/2019 (exact date)., presents today for her annual examination.  Her menses are regular every 28-30 days, lasting 7 day(s).  Dysmenorrhea none. She does not have intermenstrual bleeding.  The bleeding is still heavy, she does have some clots.  She has had to take iron to help with her drop in hemoglobin.  She has to use overnight pads during the day for the first 4 days of the menses.   She is sexually active. She uses nothing for contraception.   Last Pap: 04/2018  Results were: no abnormalities /neg HPV DNA.  Hx of STDs: none  Last mammogram: 09/2018  Results were: normal--routine follow-up in 12 months There is a FH of breast cancer in her maternal great aunt. There is no FH of ovarian cancer. The patient does do self-breast exams.  Colonoscopy: last year due to family history of colon cancer.  She also had an upper endoscopy.  She had multiple precancerous polyps on colonoscopy. So, follow up is 3 years from last year.  DEXA: has not been screened for osteoporosis  Tobacco use: The patient denies current or previous tobacco use. Alcohol use: social drinker Exercise: several times per week.  The patient wears seatbelts: yes.     She has had some changes in the skin tissue in her right breast. The spot is small, it itches, the skin is dryer than the surrounding skin. She noticed this about 1 month. She tried putting some cream on it. The size of the area has not changed.  She has a mammogram scheduled for 10/09/2019.   Past Medical History:  Diagnosis Date  . Allergy Enviromental  . Anemia may/2017  . Asthma    as a child  . Back pain gerd  . Constipation   . Dysuria 11/25/2018  . Glaucoma   . Heart murmur  childhood  . IBS (irritable bowel syndrome)   . Joint pain   . Menorrhagia   . Palpitations   . Plantar fasciitis   . Prediabetes   . Swelling    feet, legs  . Torn meniscus    Right  . Vaginal burning 11/25/2018    Past Surgical History:  Procedure Laterality Date  . CHOLECYSTECTOMY  2005  . COLONOSCOPY WITH PROPOFOL N/A 10/27/2018   Procedure: COLONOSCOPY WITH PROPOFOL;  Surgeon: Lin Landsman, MD;  Location: [facility] ENDOSCOPY;  Service: Gastroenterology;  Laterality: N/A;  . DILATATION & CURETTAGE/HYSTEROSCOPY WITH MYOSURE N/A 06/23/2018   Procedure: DILATATION & CURETTAGE/HYSTEROSCOPY WITH MYOSURE;  Surgeon: Will Bonnet, MD;  Location: [facility] ORS;  Service: Gynecology;  Laterality: N/A;  . DILATION AND CURETTAGE OF UTERUS    . ERCP     3 times  . ESOPHAGOGASTRODUODENOSCOPY (EGD) WITH PROPOFOL  10/27/2018   Procedure: ESOPHAGOGASTRODUODENOSCOPY (EGD) WITH PROPOFOL;  Surgeon: Lin Landsman, MD;  Location: [facility] ENDOSCOPY;  Service: Gastroenterology;;  . EYE SURGERY    . MENISCUS REPAIR Right    x 2    Prior to Admission medications   Medication Sig Start Date End Date Taking? Authorizing Provider  atropine 1 % ophthalmic solution Place 1 drop into the right eye daily.   Yes [provider]  brimonidine-timolol (COMBIGAN) 0.2-0.5 % ophthalmic solution Place  1 drop into the right eye every 12 (twelve) hours.    Yes [provider]  cetirizine (ZYRTEC) 10 MG tablet Take 10 mg by mouth daily as needed for allergies.   Yes [provider]  ibuprofen (ADVIL,MOTRIN) 600 MG tablet Take 1 tablet (600 mg total) by mouth every 6 (six) hours as needed for mild pain, moderate pain or cramping. 06/23/18  Yes Will Bonnet, MD  Iron-FA-B Cmp-C-Biot-Probiotic (FUSION PLUS) CAPS  04/27/19  Yes [provider]  linaclotide (LINZESS) 145 MCG CAPS capsule TAKE 1 CAPSULE BY MOUTH ONCE DAILY BEFORE BREAKFAST for constipation. 08/23/19  Yes Pleas Koch,  NP  metFORMIN (GLUCOPHAGE) 500 MG tablet Take 1 tablet (500 mg total) by mouth 2 (two) times daily with a meal. 09/07/19 10/07/19 Yes Eber Jones, MD  Vitamin D, Ergocalciferol, (DRISDOL) 1.25 MG (50000 UNIT) CAPS capsule TAKE 1 CAPSULE BY MOUTH EVERY 7 DAYS 09/07/19  Yes Eber Jones, MD  psyllium (METAMUCIL) 58.6 % packet Take 1 packet by mouth daily.    [provider]    Allergies  Allergen Reactions  . Quinoa-Kale-Hemp [Alimentum]     Obstetric History: G0P0000  Family History  Problem Relation Age of Onset  . Colon cancer Mother 52  . Diabetes Mother   . Hypertension Mother   . Arthritis Mother   . Transient ischemic attack Mother   . Liver disease Mother   . Obesity Mother   . Diabetes Father   . Heart disease Father   . Esophageal cancer Maternal Grandmother   . Cancer Maternal Grandmother   . Diabetes Maternal Grandmother   . Alcohol abuse Maternal Grandfather   . Breast cancer Maternal Aunt        mat great aunt    Social History   Socioeconomic History  . Marital status: Married    Spouse name: Financial planner  . Number of children: 0  . Years of education: Not on file  . Highest education level: Not on file  Occupational History  . Occupation: Agricultural consultant: [employer]  Tobacco Use  . Smoking status: Never Smoker  . Smokeless tobacco: Never Used  Vaping Use  . Vaping Use: Never used  Substance and Sexual Activity  . Alcohol use: Yes    Alcohol/week: 2.0 standard drinks    Types: 2 Glasses of wine per week    Comment: occasional  . Drug use: No  . Sexual activity: Yes    Birth control/protection: None  Other Topics Concern  . Not on file  Social History Narrative   Married.   No children.   Works in the [employer].   Enjoys watching movies, walking, reading.    Social Determinants of Health   Financial Resource Strain:   . Difficulty of Paying Living Expenses:   Food Insecurity:   .  Worried About Charity fundraiser in the Last Year:   . Arboriculturist in the Last Year:   Transportation Needs:   . Film/video editor (Medical):   Marland Kitchen Lack of Transportation (Non-Medical):   Physical Activity:   . Days of Exercise per Week:   . Minutes of Exercise per Session:   Stress:   . Feeling of Stress :   Social Connections:   . Frequency of Communication with Friends and Family:   . Frequency of Social Gatherings with Friends and Family:   . Attends Religious Services:   . Active Member of  Clubs or Organizations:   . Attends Archivist Meetings:   Marland Kitchen Marital Status:   Intimate Partner Violence:   . Fear of Current or Ex-Partner:   . Emotionally Abused:   Marland Kitchen Physically Abused:   . Sexually Abused:     Review of Systems  Constitutional: Negative.   HENT: Negative.   Eyes: Negative.   Respiratory: Negative.   Cardiovascular: Negative.   Gastrointestinal: Negative.   Genitourinary: Negative.   Musculoskeletal: Negative.   Skin: Negative.   Neurological: Negative.   Psychiatric/Behavioral: Negative.      Physical Exam BP 113/79   Pulse 92   Ht 5\' 8"  (1.727 m)   Wt 211 lb (95.7 kg)   LMP 09/10/2019 (Exact Date)   BMI 32.08 kg/m   Physical Exam Constitutional:      General: She is not in acute distress.    Appearance: Normal appearance. She is well-developed.  Genitourinary:     Pelvic exam was performed with patient in the lithotomy position.     Vulva, urethra, bladder and uterus normal.     No inguinal adenopathy present in the right or left side.    No signs of injury in the vagina.     No vaginal discharge, erythema, tenderness or bleeding.     No cervical motion tenderness, discharge, lesion or polyp.     Uterus is mobile.     Uterus is not enlarged or tender.     No uterine mass detected.    Uterus is anteverted.     No right or left adnexal mass present.     Right adnexa not tender or full.     Left adnexa not tender or full.   HENT:     Head: Normocephalic and atraumatic.  Eyes:     General: No scleral icterus.    Conjunctiva/sclera: Conjunctivae normal.  Neck:     Thyroid: No thyromegaly.  Cardiovascular:     Rate and Rhythm: Normal rate and regular rhythm.     Heart sounds: No murmur. No friction rub. No gallop.   Pulmonary:     Effort: Pulmonary effort is normal. No respiratory distress.     Breath sounds: Normal breath sounds. No wheezing or rales.  Chest:     Breasts:        Right: Skin change present. No swelling, bleeding, inverted nipple, mass, nipple discharge or tenderness.        Left: No swelling, bleeding, inverted nipple, mass, nipple discharge, skin change or tenderness.    Abdominal:     General: Bowel sounds are normal. There is no distension.     Palpations: Abdomen is soft. There is no mass.     Tenderness: There is no abdominal tenderness. There is no guarding or rebound.  Musculoskeletal:        General: No swelling or tenderness. Normal range of motion.     Cervical back: Normal range of motion and neck supple.  Lymphadenopathy:     Cervical: No cervical adenopathy.     Lower Body: No right inguinal adenopathy. No left inguinal adenopathy.  Neurological:     General: No focal deficit present.     Mental Status: She is alert and oriented to person, place, and time.     Cranial Nerves: No cranial nerve deficit.  Skin:    General: Skin is warm and dry.     Findings: No erythema or rash.  Psychiatric:        Mood and  Affect: Mood normal.        Behavior: Behavior normal.        Judgment: Judgment normal.      Female chaperone present for pelvic and breast  portions of the physical exam  Results: AUDIT Questionnaire (screen for alcoholism): 3 PHQ-9: 0  Assessment: 44 y.o. G0P0000 female here for routine gynecologic examination.  Plan: Problem List Items Addressed This Visit    None    Visit Diagnoses    Women's annual routine gynecological examination    -   Primary   Screening for depression       Screening for alcoholism          Screening: -- Blood pressure screen normal -- Colonoscopy - not due -- Mammogram - due - already scheduled at [facility] on 10/09/2019 -- Weight screening: overweight: continue to monitor -- Depression screening negative (PHQ-9) -- Nutrition: normal -- cholesterol screening: per PCP -- osteoporosis screening: not due -- tobacco screening: not using -- alcohol screening: AUDIT questionnaire indicates low-risk usage. -- family history of breast cancer screening: done. not at high risk. -- no evidence of domestic violence or intimate partner violence. -- STD screening: gonorrhea/chlamydia NAAT not collected per patient request. -- pap smear not collected per ASCCP guidelines  Skin lesion of areola: Likely benign. I did encourage her to see her dermatologist about this soon.  Keep scheduled screening mammogram.  Prentice Docker, MD 09/27/2019 1:46 PM

## 2019-09-30 DIAGNOSIS — E119 Type 2 diabetes mellitus without complications: Secondary | ICD-10-CM | POA: Diagnosis not present

## 2019-09-30 DIAGNOSIS — H5212 Myopia, left eye: Secondary | ICD-10-CM | POA: Diagnosis not present

## 2019-10-09 ENCOUNTER — Ambulatory Visit
Admission: RE | Admit: 2019-10-09 | Discharge: 2019-10-09 | Disposition: A | Discharge status: Home / Self Care | Payer: 59 | Source: Ambulatory Visit | Attending: Obstetrics and Gynecology | Admitting: Obstetrics and Gynecology

## 2019-10-09 ENCOUNTER — Other Ambulatory Visit: Payer: Self-pay

## 2019-10-09 DIAGNOSIS — Z1231 Encounter for screening mammogram for malignant neoplasm of breast: Secondary | ICD-10-CM | POA: Diagnosis not present

## 2019-10-10 ENCOUNTER — Ambulatory Visit (INDEPENDENT_AMBULATORY_CARE_PROVIDER_SITE_OTHER): Payer: 59 | Admitting: Family Medicine

## 2019-10-10 ENCOUNTER — Encounter (INDEPENDENT_AMBULATORY_CARE_PROVIDER_SITE_OTHER): Payer: Self-pay | Admitting: Family Medicine

## 2019-10-10 VITALS — BP 105/69 | HR 79 | Temp 98.2°F | Ht 68.0 in | Wt 208.0 lb

## 2019-10-10 DIAGNOSIS — R7303 Prediabetes: Secondary | ICD-10-CM | POA: Diagnosis not present

## 2019-10-10 DIAGNOSIS — E559 Vitamin D deficiency, unspecified: Secondary | ICD-10-CM

## 2019-10-10 DIAGNOSIS — Z9189 Other specified personal risk factors, not elsewhere classified: Secondary | ICD-10-CM

## 2019-10-10 DIAGNOSIS — Z6831 Body mass index (BMI) 31.0-31.9, adult: Secondary | ICD-10-CM | POA: Diagnosis not present

## 2019-10-10 DIAGNOSIS — E669 Obesity, unspecified: Secondary | ICD-10-CM

## 2019-10-10 MED ORDER — VITAMIN D (ERGOCALCIFEROL) 1.25 MG (50000 UNIT) PO CAPS: ORAL_CAPSULE | ORAL | 0 refills | Status: DC

## 2019-10-10 MED ORDER — METFORMIN HCL 500 MG PO TABS: 500.0000 mg | ORAL_TABLET | Freq: Two times a day (BID) | ORAL | 0 refills | Status: DC

## 2019-10-11 NOTE — Progress Notes (Signed)
Chief Complaint:   OBESITY Angela [name] is here to discuss her progress with her obesity treatment plan along with follow-up of her obesity related diagnoses. Angela [name] is on the Category 3 Plan and states she is following her eating plan approximately 85-90% of the time. Angela [name] states she is doing high intensity exercise for 45 minutes 1 time per week and walking with resistance for 50 minutes 2 times per week.  Today's visit was #: 26 Starting weight: 220 lbs Starting date: 10/05/2018 Today's weight: 208 lbs Today's date: 10/10/2019 Total lbs lost to date: 12 lbs Total lbs lost since last in-office visit: 0  Interim History: Angela [name] says that she drank quite a bit of coffee and had sweet indulgences over the past weekend.  Denies hunger.  She says she has otherwise been following the plan.  She has been getting 8 ounces of protein at dinner and a 2 ounce substitute of meat.  She says she has not been as mindful of snack calories in the past few weeks.  Subjective:   1. Vitamin D deficiency Angela [name]'s Vitamin D level was 35.6 on 05/25/2019. She is currently taking prescription vitamin D 50,000 IU each week. She denies nausea, vomiting or muscle weakness.  2. Prediabetes Angela [name] has a diagnosis of prediabetes based on her elevated HgA1c and was informed this puts her at greater risk of developing diabetes. She continues to work on diet and exercise to decrease her risk of diabetes. She denies nausea or hypoglycemia.  She endorses fatigue.  She is taking metformin with no GI side effects.  Lab Results  Component Value Date   HGBA1C 5.5 05/25/2019   Lab Results  Component Value Date   INSULIN 24.5 05/25/2019   INSULIN 21.9 02/02/2019   INSULIN 61.1 (H) 10/05/2018   3. At risk for osteoporosis Angela [name] is at higher risk of osteopenia and osteoporosis due to Vitamin D deficiency.   Assessment/Plan:   1. Vitamin D deficiency Low Vitamin D level contributes to fatigue and are associated with  obesity, breast, and colon cancer. She agrees to continue to take prescription Vitamin D @50 ,000 IU every week and will follow-up for routine testing of Vitamin D, at least 2-3 times per year to avoid over-replacement. - Vitamin D, Ergocalciferol, (DRISDOL) 1.25 MG (50000 UNIT) CAPS capsule; TAKE 1 CAPSULE BY MOUTH EVERY 7 DAYS  Dispense: 4 capsule; Refill: 0  2. Prediabetes Angela [name] will continue to work on weight loss, exercise, and decreasing simple carbohydrates to help decrease the risk of diabetes.  - metFORMIN (GLUCOPHAGE) 500 MG tablet; Take 1 tablet (500 mg total) by mouth 2 (two) times daily with a meal.  Dispense: 60 tablet; Refill: 0  3. At risk for osteoporosis Angela [name] was given approximately 15 minutes of osteoporosis prevention counseling today. Angela [name] is at risk for osteopenia and osteoporosis due to her Vitamin D deficiency. She was encouraged to take her Vitamin D and follow her higher calcium diet and increase strengthening exercise to help strengthen her bones and decrease her risk of osteopenia and osteoporosis.  Repetitive spaced learning was employed today to elicit superior memory formation and behavioral change.  4. Class 1 obesity with serious comorbidity and body mass index (BMI) of 31.0 to 31.9 in adult, unspecified obesity type Angela [name] is currently in the action stage of change. As such, her goal is to continue with weight loss efforts. She has agreed to keeping a food journal and adhering to recommended goals of 1450-1600 calories and 100+ grams of  protein daily.   Exercise goals: As is.  Behavioral modification strategies: increasing lean protein intake, meal planning and cooking strategies, keeping healthy foods in the home and planning for success.  Angela [name] has agreed to follow-up with our clinic in 3-4 weeks, fasting, repeat IC. She was informed of the importance of frequent follow-up visits to maximize her success with intensive lifestyle modifications for her  multiple health conditions.   Objective:   Blood pressure 105/69, pulse 79, temperature 98.2 F (36.8 C), temperature source Oral, height 5\' 8"  (1.727 m), weight 208 lb (94.3 kg), last menstrual period 09/18/2019, SpO2 96 %. Body mass index is 31.63 kg/m.  General: Cooperative, alert, well developed, in no acute distress. HEENT: Conjunctivae and lids unremarkable. Cardiovascular: Regular rhythm.  Lungs: Normal work of breathing. Neurologic: No focal deficits.   Lab Results  Component Value Date   CREATININE 0.76 02/02/2019   BUN 16 02/02/2019   NA 139 02/02/2019   K 4.5 02/02/2019   CL 105 02/02/2019   CO2 21 02/02/2019   Lab Results  Component Value Date   ALT 26 02/02/2019   AST 21 02/02/2019   ALKPHOS 78 02/02/2019   BILITOT 0.3 02/02/2019   Lab Results  Component Value Date   HGBA1C 5.5 05/25/2019   HGBA1C 5.5 02/02/2019   HGBA1C 5.7 (H) 10/05/2018   HGBA1C 5.9 06/22/2018   HGBA1C 5.8 06/21/2017   Lab Results  Component Value Date   INSULIN 24.5 05/25/2019   INSULIN 21.9 02/02/2019   INSULIN 61.1 (H) 10/05/2018   Lab Results  Component Value Date   TSH 2.020 10/05/2018   Lab Results  Component Value Date   CHOL 156 10/05/2018   HDL 61 10/05/2018   LDLCALC 74 10/05/2018   TRIG 106 10/05/2018   CHOLHDL 3 06/22/2018   Lab Results  Component Value Date   WBC 4.8 05/25/2019   HGB 12.3 05/25/2019   HCT 36.9 05/25/2019   MCV 88 05/25/2019   PLT 223 05/25/2019   Lab Results  Component Value Date   IRON 58 05/25/2019   TIBC 319 05/25/2019   FERRITIN 39 05/25/2019   Attestation Statements:   Reviewed by clinician on day of visit: allergies, medications, problem list, medical history, surgical history, family history, social history, and previous encounter notes.  I, Water quality scientist, CMA, am acting as transcriptionist for Coralie Common, MD.  I have reviewed the above documentation for accuracy and completeness, and I agree with the above. -  Jinny Blossom, MD

## 2019-10-16 ENCOUNTER — Other Ambulatory Visit (HOSPITAL_COMMUNITY): Payer: Self-pay | Admitting: Ophthalmology

## 2019-10-16 MED FILL — COMBIGAN EYE DROPS: 0.2-0.5 | 30 days supply | Qty: 5 | Fill #0

## 2019-10-17 MED FILL — VIT D2 1.25 MG (50,000 UNIT: 1.25 MG | 28 days supply | Qty: 4 | Fill #0

## 2019-10-19 DIAGNOSIS — Z20822 Contact with and (suspected) exposure to covid-19: Secondary | ICD-10-CM | POA: Diagnosis not present

## 2019-10-19 DIAGNOSIS — Z03818 Encounter for observation for suspected exposure to other biological agents ruled out: Secondary | ICD-10-CM | POA: Diagnosis not present

## 2019-10-21 MED FILL — METFORMIN HCL 500 MG TABS: 500 | 30 days supply | Qty: 60 | Fill #0

## 2019-10-31 DIAGNOSIS — Z20822 Contact with and (suspected) exposure to covid-19: Secondary | ICD-10-CM | POA: Diagnosis not present

## 2019-10-31 DIAGNOSIS — Z03818 Encounter for observation for suspected exposure to other biological agents ruled out: Secondary | ICD-10-CM | POA: Diagnosis not present

## 2019-11-01 ENCOUNTER — Encounter (INDEPENDENT_AMBULATORY_CARE_PROVIDER_SITE_OTHER): Payer: Self-pay | Admitting: Family Medicine

## 2019-11-01 ENCOUNTER — Other Ambulatory Visit: Payer: Self-pay

## 2019-11-01 ENCOUNTER — Ambulatory Visit (INDEPENDENT_AMBULATORY_CARE_PROVIDER_SITE_OTHER): Payer: 59 | Admitting: Family Medicine

## 2019-11-01 VITALS — BP 98/66 | HR 78 | Temp 98.0°F | Ht 68.0 in | Wt 203.0 lb

## 2019-11-01 DIAGNOSIS — Z9189 Other specified personal risk factors, not elsewhere classified: Secondary | ICD-10-CM | POA: Diagnosis not present

## 2019-11-01 DIAGNOSIS — Z6830 Body mass index (BMI) 30.0-30.9, adult: Secondary | ICD-10-CM

## 2019-11-01 DIAGNOSIS — E559 Vitamin D deficiency, unspecified: Secondary | ICD-10-CM | POA: Diagnosis not present

## 2019-11-01 DIAGNOSIS — R5383 Other fatigue: Secondary | ICD-10-CM | POA: Diagnosis not present

## 2019-11-01 DIAGNOSIS — E669 Obesity, unspecified: Secondary | ICD-10-CM

## 2019-11-01 DIAGNOSIS — R7303 Prediabetes: Secondary | ICD-10-CM | POA: Diagnosis not present

## 2019-11-01 MED ORDER — METFORMIN HCL 500 MG PO TABS: 500.0000 mg | ORAL_TABLET | Freq: Two times a day (BID) | ORAL | 0 refills | Status: DC

## 2019-11-01 MED ORDER — VITAMIN D (ERGOCALCIFEROL) 1.25 MG (50000 UNIT) PO CAPS: ORAL_CAPSULE | ORAL | 0 refills | Status: DC

## 2019-11-02 LAB — COMPREHENSIVE METABOLIC PANEL
ALT: 24 IU/L (ref 0–32)
AST: 22 IU/L (ref 0–40)
Albumin/Globulin Ratio: 1.3 (ref 1.2–2.2)
Albumin: 4.1 g/dL (ref 3.8–4.8)
Alkaline Phosphatase: 65 IU/L (ref 48–121)
BUN/Creatinine Ratio: 15 (ref 9–23)
BUN: 11 mg/dL (ref 6–24)
Bilirubin Total: 0.2 mg/dL (ref 0.0–1.2)
CO2: 20 mmol/L (ref 20–29)
Calcium: 9.1 mg/dL (ref 8.7–10.2)
Chloride: 107 mmol/L — ABNORMAL HIGH (ref 96–106)
Creatinine, Ser: 0.73 mg/dL (ref 0.57–1.00)
GFR calc Af Amer: 115 mL/min/{1.73_m2} (ref >59)
GFR calc non Af Amer: 100 mL/min/{1.73_m2} (ref >59)
Globulin, Total: 3.1 g/dL (ref 1.5–4.5)
Glucose: 91 mg/dL (ref 65–99)
Potassium: 5 mmol/L (ref 3.5–5.2)
Sodium: 140 mmol/L (ref 134–144)
Total Protein: 7.2 g/dL (ref 6.0–8.5)

## 2019-11-02 LAB — VITAMIN D 25 HYDROXY (VIT D DEFICIENCY, FRACTURES): Vit D, 25-Hydroxy: 39.9 ng/mL (ref 30.0–100.0)

## 2019-11-02 LAB — HEMOGLOBIN A1C
Est. average glucose Bld gHb Est-mCnc: 111 mg/dL
Hgb A1c MFr Bld: 5.5 % (ref 4.8–5.6)

## 2019-11-02 LAB — INSULIN, RANDOM: INSULIN: 24.4 u[IU]/mL (ref 2.6–24.9)

## 2019-11-02 LAB — VITAMIN B12: Vitamin B-12: 624 pg/mL (ref 232–1245)

## 2019-11-05 DIAGNOSIS — Z6830 Body mass index (BMI) 30.0-30.9, adult: Secondary | ICD-10-CM

## 2019-11-05 DIAGNOSIS — D509 Iron deficiency anemia, unspecified: Secondary | ICD-10-CM

## 2019-11-07 NOTE — Progress Notes (Signed)
Chief Complaint:   OBESITY Angela [name] is here to discuss her progress with her obesity treatment plan along with follow-up of her obesity related diagnoses. Angela [name] is on keeping a food journal and adhering to recommended goals of 1450-1600 calories and 100+ grams of protein daily and states she is following her eating plan approximately 90% of the time. Angela [name] states she is walking 3-5 miles and resistance bands for 15 minutes 3 times per week.  Today's visit was #: 20 Starting weight: 220 lbs Starting date: 10/05/2018 Today's weight: 203 lbs Today's date: 11/01/2019 Total lbs lost to date: 17 Total lbs lost since last in-office visit: 5  Interim History: Angela [name] was in [place] for the last week (in [place]). She tried to stick to the plan about 90% secondary to weight gain last appointment. 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 She has already gone grocery shopping. She went back to Category 3 prior to traveling.  Subjective:   1. Pre-diabetes Angela [name] is on metformin, and she denies GI side effects.  2. Vitamin D deficiency Angela [name] denies nausea, vomiting, or muscle weakness, but she notes fatigue. She is on prescription Vit D.  3. Other fatigue Angela [name]'s last IC was 1847. She notes improvement in symptoms since her first appointment.  4. At risk for osteoporosis Angela [name] is at higher risk of osteopenia and osteoporosis due to Vitamin D deficiency.   Assessment/Plan:   1. Pre-diabetes Angela [name] will continue to work on weight loss, exercise, and decreasing simple carbohydrates to help decrease the risk of diabetes. We will check labs today, and we will refill metformin for 1 month.  - metFORMIN (GLUCOPHAGE) 500 MG tablet; Take 1 tablet (500 mg total) by mouth 2 (two) times daily with a meal.  Dispense: 60 tablet; Refill: 0 - Comprehensive metabolic panel - Hemoglobin A1c - Insulin, random - Vitamin B12  2. Vitamin D deficiency Low Vitamin D level contributes to fatigue and are  associated with obesity, breast, and colon cancer. We will check labs today, and we will refill prescription Vitamin D for 1 month. Angela [name] will follow-up for routine testing of Vitamin D, at least 2-3 times per year to avoid over-replacement.  - Vitamin D, Ergocalciferol, (DRISDOL) 1.25 MG (50000 UNIT) CAPS capsule; TAKE 1 CAPSULE BY MOUTH EVERY 7 DAYS  Dispense: 4 capsule; Refill: 0 - VITAMIN D 25 Hydroxy (Vit-D Deficiency, Fractures)  3. Other fatigue IC was repeated today. Angela [name] will continue to follow up as directed.  4. At risk for osteoporosis Angela [name] was given approximately 15 minutes of osteoporosis prevention counseling today. Angela [name] is at risk for osteopenia and osteoporosis due to her Vitamin D deficiency. She was encouraged to take her Vitamin D and follow her higher calcium diet and increase strengthening exercise to help strengthen her bones and decrease her risk of osteopenia and osteoporosis.  Repetitive spaced learning was employed today to elicit superior memory formation and behavioral change.  5. Class 1 obesity with serious comorbidity and body mass index (BMI) of 30.0 to 30.9 in adult, unspecified obesity type Angela [name] is currently in the action stage of change. As such, her goal is to continue with weight loss efforts. She has agreed to the Category 3 Plan or keeping a food journal and adhering to recommended goals of 1650-1800 calories and 125+ grams of protein daily.   Exercise goals: As is.  Behavioral modification strategies: increasing lean protein intake, meal planning and cooking strategies, planning for success and keeping a strict food journal.  Angela [name] has agreed to follow-up with our clinic in 3 to 4 weeks. She was informed of the importance of frequent follow-up visits to maximize her success with intensive lifestyle modifications for her multiple health conditions.   Angela [name] was informed we would discuss her lab results at her next visit unless there is a  critical issue that needs to be addressed sooner. Angela [name] agreed to keep her next visit at the agreed upon time to discuss these results.  Objective:   Blood pressure 98/66, pulse 78, temperature 98 F (36.7 C), temperature source Oral, height 5\' 8"  (1.727 m), weight 203 lb (92.1 kg), last menstrual period 10/17/2019, SpO2 98 %. Body mass index is 30.87 kg/m.  General: Cooperative, alert, well developed, in no acute distress. HEENT: Conjunctivae and lids unremarkable. Cardiovascular: Regular rhythm.  Lungs: Normal work of breathing. Neurologic: No focal deficits.   Lab Results  Component Value Date   CREATININE 0.73 11/01/2019   BUN 11 11/01/2019   NA 140 11/01/2019   K 5.0 11/01/2019   CL 107 (H) 11/01/2019   CO2 20 11/01/2019   Lab Results  Component Value Date   ALT 24 11/01/2019   AST 22 11/01/2019   ALKPHOS 65 11/01/2019   BILITOT 0.2 11/01/2019   Lab Results  Component Value Date   HGBA1C 5.5 11/01/2019   HGBA1C 5.5 05/25/2019   HGBA1C 5.5 02/02/2019   HGBA1C 5.7 (H) 10/05/2018   HGBA1C 5.9 06/22/2018   Lab Results  Component Value Date   INSULIN 24.4 11/01/2019   INSULIN 24.5 05/25/2019   INSULIN 21.9 02/02/2019   INSULIN 61.1 (H) 10/05/2018   Lab Results  Component Value Date   TSH 2.020 10/05/2018   Lab Results  Component Value Date   CHOL 156 10/05/2018   HDL 61 10/05/2018   LDLCALC 74 10/05/2018   TRIG 106 10/05/2018   CHOLHDL 3 06/22/2018   Lab Results  Component Value Date   WBC 4.8 05/25/2019   HGB 12.3 05/25/2019   HCT 36.9 05/25/2019   MCV 88 05/25/2019   PLT 223 05/25/2019   Lab Results  Component Value Date   IRON 58 05/25/2019   TIBC 319 05/25/2019   FERRITIN 39 05/25/2019   Attestation Statements:   Reviewed by clinician on day of visit: allergies, medications, problem list, medical history, surgical history, family history, social history, and previous encounter notes.   I, Trixie Dredge, am acting as transcriptionist  for Coralie Common, MD.  I have reviewed the above documentation for accuracy and completeness, and I agree with the above. - Jinny Blossom, MD

## 2019-11-08 ENCOUNTER — Telehealth: Payer: 59 | Admitting: Family

## 2019-11-08 DIAGNOSIS — K5900 Constipation, unspecified: Secondary | ICD-10-CM

## 2019-11-08 MED ORDER — HYDROCORTISONE ACETATE 25 MG RE SUPP
25.0000 mg | Freq: Two times a day (BID) | RECTAL | 0 refills | Status: DC
Start: 2019-11-08 — End: 2019-11-08

## 2019-11-08 MED ORDER — SULFAMETHOXAZOLE-TRIMETHOPRIM 800-160 MG PO TABS
1.0000 | ORAL_TABLET | Freq: Two times a day (BID) | ORAL | 0 refills | Status: DC
Start: 2019-11-08 — End: 2019-12-05

## 2019-11-08 MED ORDER — CEPHALEXIN 500 MG PO CAPS
500.0000 mg | ORAL_CAPSULE | Freq: Two times a day (BID) | ORAL | 0 refills | Status: DC
Start: 2019-11-08 — End: 2019-11-08

## 2019-11-08 NOTE — Addendum Note (Signed)
Addended by: Evelina Dun A on: 11/08/2019 10:56 AM   Modules accepted: Orders

## 2019-11-08 NOTE — Progress Notes (Signed)

## 2019-11-08 NOTE — Addendum Note (Signed)
Addended by: Evelina Dun A on: 11/08/2019 12:24 PM   Modules accepted: Orders

## 2019-11-08 NOTE — Progress Notes (Signed)
E-Visit for Hemorrhoid  We are sorry that you are not feeling well. We are here to help!  Hemorrhoids are swollen veins in the rectum. They can cause itching, bleeding, and pain. Hemorrhoids are very common.  In some cases, you can see or feel hemorrhoids around the outside of the rectum. In other cases, you cannot see them because they are hidden inside the rectum. Be patient - It can take months for this to improve or go away.   Hemorrhoids do not always cause symptoms. But when they do, symptoms can include: ?Itching of the skin around the anus ?Bleeding - Bleeding is usually painless. You might see bright red blood after using the toilet. ?Pain - If a blood clot forms inside a hemorrhoid, this can cause pain. It can also cause a lump that you might be able to feel.   What can I do to keep from getting more hemorrhoids? -- The most important thing you can do is to keep from getting constipated. You should have a bowel movement at least a few times a week. When you have a bowel movement, you also should not have to push too much. Plus, your bowel movements should not be too hard. Being constipated and having hard bowel movements can make hemorrhoids worse.   I have prescribed Anusol HC suppositories.  Insert into rectum twice per day for 6 days, you need to continue your Linzess daily. You can also add Miralax twice a day. Make sure you drink lots of fluid.   HOME CARE:  Sitz Baths twice daily. Soak buttocks in 2 or 3 inches of warm water for 10 to 15 minutes. Do not add soap, bubble bath, or anything to the water.  Stool softener such as Colace 100 mg twice daily AND Miralax 1 scoop daily until you have regular soft stools  Over the counter Preparation H  Tucks Pads  Witch Hazel  Here are some steps you can take to avoid getting constipated or having hard stools:  ?Eat lots of fruits, vegetables, and other foods with fiber. Fiber helps to increase bowel movements. If you do not get  enough fiber from your diet, you can take fiber supplements. These come in the form of powders, wafers, or pills. Some examples are Metamucil, Citrucel, Benefiber and FiberCon. If you take a fiber supplement, be sure to read the label so you know how much to take. If you're not sure, ask your provider or nurse. ?Take medicines called "stool softeners" such as docusate sodium (sample brand names: Colace, Dulcolax). These medicines increase the number of bowel movements you have. They are safe to take and they can prevent problems later.  You should request a referral for a follow up evaluation with a Gastroenterologist (GI doctor) to evaluate this chronic and relapsing condition - even if it improves to see what further steps need to be taken. This is highly linked to chronic constipation and straining to have a bowel movement. It may require further treatment or surgical intervention.   GET HELP RIGHT AWAY IF:  You develop severe pain  You have heavy bleeding   FOLLOW UP WITH YOUR PRIMARY PROVIDER IF:  If your symptoms do not improve within 10 days  MAKE SURE YOU   Understand these instructions.  Will watch your condition.  Will get help right away if you are not doing well or get worse.  Your e-visit answers were reviewed by a board certified advanced clinical practitioner to complete your personal care  plan. Depending upon the condition, your plan could have included both over the counter or prescription medications.  Your safety is important to [place]. If you have drug allergies check your prescription carefully.   You can use MyChart to ask questions about today's visit, request a non-urgent call back, or ask for a work or school excuse for 24 hours related to this e-Visit. If it has been greater than 24 hours you will need to follow up with your provider, or enter a new e-Visit to address those concerns.  You will get an e-mail with a link to a survey asking about your experience.  We  hope that your e-visit has been valuable and will speed your recovery! Thank you for using e-visits.     Approximately 5 minutes was spent documenting and reviewing patient's chart.

## 2019-11-20 ENCOUNTER — Other Ambulatory Visit (INDEPENDENT_AMBULATORY_CARE_PROVIDER_SITE_OTHER): Payer: 59

## 2019-11-20 ENCOUNTER — Other Ambulatory Visit: Payer: Self-pay

## 2019-11-20 DIAGNOSIS — E669 Obesity, unspecified: Secondary | ICD-10-CM | POA: Diagnosis not present

## 2019-11-20 DIAGNOSIS — D509 Iron deficiency anemia, unspecified: Secondary | ICD-10-CM

## 2019-11-20 DIAGNOSIS — Z6830 Body mass index (BMI) 30.0-30.9, adult: Secondary | ICD-10-CM | POA: Diagnosis not present

## 2019-11-20 LAB — CBC
HCT: 33.5 % — ABNORMAL LOW (ref 36.0–46.0)
Hemoglobin: 11.3 g/dL — ABNORMAL LOW (ref 12.0–15.0)
MCHC: 33.8 g/dL (ref 30.0–36.0)
MCV: 82.9 fl (ref 78.0–100.0)
Platelets: 215 10*3/uL (ref 150.0–400.0)
RBC: 4.04 Mil/uL (ref 3.87–5.11)
RDW: 15.6 % — ABNORMAL HIGH (ref 11.5–15.5)
WBC: 5.4 10*3/uL (ref 4.0–10.5)

## 2019-11-20 LAB — LIPID PANEL
Cholesterol: 142 mg/dL (ref 0–200)
HDL: 56.6 mg/dL (ref >39.00)
LDL Cholesterol: 65 mg/dL (ref 0–99)
NonHDL: 85.68
Total CHOL/HDL Ratio: 3
Triglycerides: 105 mg/dL (ref 0.0–149.0)
VLDL: 21 mg/dL (ref 0.0–40.0)

## 2019-11-20 MED FILL — METFORMIN HCL 500 MG TABS: 500 | 30 days supply | Qty: 60 | Fill #0

## 2019-11-20 MED FILL — VIT D2 1.25 MG (50,000 UNIT: 1.25 MG | 28 days supply | Qty: 4 | Fill #0

## 2019-11-20 MED FILL — LINZESS 145 MCG CAPSULE: 145 | 90 days supply | Qty: 90 | Fill #0

## 2019-11-21 ENCOUNTER — Encounter: Payer: Self-pay | Admitting: Gastroenterology

## 2019-11-21 DIAGNOSIS — D509 Iron deficiency anemia, unspecified: Secondary | ICD-10-CM

## 2019-11-23 ENCOUNTER — Other Ambulatory Visit: Payer: Self-pay

## 2019-11-23 DIAGNOSIS — D509 Iron deficiency anemia, unspecified: Secondary | ICD-10-CM

## 2019-11-23 MED ORDER — FUSION PLUS PO CAPS: 1.0000 | ORAL_CAPSULE | Freq: Every day | ORAL | 2 refills | Status: DC

## 2019-11-23 MED FILL — FUSION PLUS CAPSULE: 30 days supply | Qty: 30 | Fill #0

## 2019-11-23 NOTE — Telephone Encounter (Signed)
Patient scheduled capsule study on 12/11/2019 with Dr. Marius Ditch. Went over instructions and sent them to mychart account. Sent fusion plus to the pharmacy. Patient made follow up appointment

## 2019-11-30 ENCOUNTER — Ambulatory Visit (INDEPENDENT_AMBULATORY_CARE_PROVIDER_SITE_OTHER): Payer: 59 | Admitting: Family Medicine

## 2019-11-30 MED FILL — COMBIGAN EYE DROPS: 0.2-0.5 | 30 days supply | Qty: 5 | Fill #1

## 2019-12-05 ENCOUNTER — Encounter (INDEPENDENT_AMBULATORY_CARE_PROVIDER_SITE_OTHER): Payer: Self-pay | Admitting: Family Medicine

## 2019-12-05 ENCOUNTER — Ambulatory Visit (INDEPENDENT_AMBULATORY_CARE_PROVIDER_SITE_OTHER): Payer: 59 | Admitting: Family Medicine

## 2019-12-05 ENCOUNTER — Other Ambulatory Visit: Payer: Self-pay

## 2019-12-05 VITALS — BP 117/73 | HR 74 | Temp 98.6°F | Ht 68.0 in | Wt 203.0 lb

## 2019-12-05 DIAGNOSIS — Z9189 Other specified personal risk factors, not elsewhere classified: Secondary | ICD-10-CM | POA: Diagnosis not present

## 2019-12-05 DIAGNOSIS — Z6830 Body mass index (BMI) 30.0-30.9, adult: Secondary | ICD-10-CM | POA: Diagnosis not present

## 2019-12-05 DIAGNOSIS — E669 Obesity, unspecified: Secondary | ICD-10-CM | POA: Diagnosis not present

## 2019-12-05 DIAGNOSIS — E559 Vitamin D deficiency, unspecified: Secondary | ICD-10-CM

## 2019-12-05 DIAGNOSIS — R7303 Prediabetes: Secondary | ICD-10-CM

## 2019-12-05 MED ORDER — VITAMIN D (ERGOCALCIFEROL) 1.25 MG (50000 UNIT) PO CAPS: ORAL_CAPSULE | ORAL | 0 refills | Status: DC

## 2019-12-05 MED ORDER — METFORMIN HCL 500 MG PO TABS: 500.0000 mg | ORAL_TABLET | Freq: Two times a day (BID) | ORAL | 0 refills | Status: DC

## 2019-12-06 NOTE — Progress Notes (Signed)
Chief Complaint:   OBESITY Angela [name] is here to discuss her progress with her obesity treatment plan along with follow-up of her obesity related diagnoses. Angela [name] is on the Category 3 Plan or keeping a food journal and adhering to recommended goals of 1650-1800 calories and 125+ grams of protein daily and states she is following her eating plan approximately 85-90% of the time. Angela [name] states she is walking for 75 minutes 3 times per week, and doing hi intensity for 45 minutes 2 times for 1 week.  Today's visit was #: 21 Starting weight: 220 lbs Starting date: 10/05/2018 Today's weight: 203 lbs Today's date: 12/05/2019 Total lbs lost to date: 17 Total lbs lost since last in-office visit: 0  Interim History: Angela [name] voices the past week she did a road trip to [place] and back. She did report that she ate indulgently while on the road trip with pizza and burgers. She has been more on the plan since returning. Most of the next few weeks is going to be home prep as she recently got a new house.   Subjective:   1. Pre-diabetes Angela [name]'s last A1c was 5.5 and insulin 24.4. She is on metformin, and she denies GI side effects.  2. Vitamin D deficiency Angela [name] denies nausea, vomiting, or muscle weakness, but she notes fatigue. She is on prescription Vit D, and her last Vit D level was 39.9.  3. At risk for diabetes mellitus Angela [name] is at higher than average risk for developing diabetes due to her obesity.   Assessment/Plan:   1. Pre-diabetes Angela [name] will continue to work on weight loss, exercise, and decreasing simple carbohydrates to help decrease the risk of diabetes. We will refill metformin for 1 month.  - metFORMIN (GLUCOPHAGE) 500 MG tablet; Take 1 tablet (500 mg total) by mouth 2 (two) times daily with a meal.  Dispense: 60 tablet; Refill: 0  2. Vitamin D deficiency Low Vitamin D level contributes to fatigue and are associated with obesity, breast, and colon cancer. We will refill  prescription Vitamin D for 1 month. Angela [name] will follow-up for routine testing of Vitamin D, at least 2-3 times per year to avoid over-replacement.  - Vitamin D, Ergocalciferol, (DRISDOL) 1.25 MG (50000 UNIT) CAPS capsule; TAKE 1 CAPSULE BY MOUTH EVERY 7 DAYS  Dispense: 4 capsule; Refill: 0  3. At risk for diabetes mellitus Angela [name] was given approximately 15 minutes of diabetes education and counseling today. We discussed intensive lifestyle modifications today with an emphasis on weight loss as well as increasing exercise and decreasing simple carbohydrates in her diet. We also reviewed medication options with an emphasis on risk versus benefit of those discussed.   Repetitive spaced learning was employed today to elicit superior memory formation and behavioral change.  4. Class 1 obesity with serious comorbidity and body mass index (BMI) of 30.0 to 30.9 in adult, unspecified obesity type Angela [name] is currently in the action stage of change. As such, her goal is to continue with weight loss efforts. She has agreed to the Category 3 Plan.   Angela [name] is to try to stay on Category 3 more than journaling to help get more on track.  Exercise goals: As is.  Behavioral modification strategies: increasing lean protein intake, increasing vegetables, meal planning and cooking strategies and keeping a strict food journal.  Angela [name] has agreed to follow-up with our clinic in 2 to 3 weeks. She was informed of the importance of frequent follow-up visits to maximize her success with intensive lifestyle  modifications for her multiple health conditions.   Objective:   Blood pressure 117/73, pulse 74, temperature 98.6 F (37 C), temperature source Oral, height 5\' 8"  (1.727 m), weight 203 lb (92.1 kg), last menstrual period 12/05/2019, SpO2 99 %. Body mass index is 30.87 kg/m.  General: Cooperative, alert, well developed, in no acute distress. HEENT: Conjunctivae and lids unremarkable. Cardiovascular: Regular  rhythm.  Lungs: Normal work of breathing. Neurologic: No focal deficits.   Lab Results  Component Value Date   CREATININE 0.73 11/01/2019   BUN 11 11/01/2019   NA 140 11/01/2019   K 5.0 11/01/2019   CL 107 (H) 11/01/2019   CO2 20 11/01/2019   Lab Results  Component Value Date   ALT 24 11/01/2019   AST 22 11/01/2019   ALKPHOS 65 11/01/2019   BILITOT 0.2 11/01/2019   Lab Results  Component Value Date   HGBA1C 5.5 11/01/2019   HGBA1C 5.5 05/25/2019   HGBA1C 5.5 02/02/2019   HGBA1C 5.7 (H) 10/05/2018   HGBA1C 5.9 06/22/2018   Lab Results  Component Value Date   INSULIN 24.4 11/01/2019   INSULIN 24.5 05/25/2019   INSULIN 21.9 02/02/2019   INSULIN 61.1 (H) 10/05/2018   Lab Results  Component Value Date   TSH 2.020 10/05/2018   Lab Results  Component Value Date   CHOL 142 11/20/2019   HDL 56.60 11/20/2019   LDLCALC 65 11/20/2019   TRIG 105.0 11/20/2019   CHOLHDL 3 11/20/2019   Lab Results  Component Value Date   WBC 5.4 11/20/2019   HGB 11.3 (L) 11/20/2019   HCT 33.5 (L) 11/20/2019   MCV 82.9 11/20/2019   PLT 215.0 11/20/2019   Lab Results  Component Value Date   IRON 58 05/25/2019   TIBC 319 05/25/2019   FERRITIN 39 05/25/2019   Attestation Statements:   Reviewed by clinician on day of visit: allergies, medications, problem list, medical history, surgical history, family history, social history, and previous encounter notes.   I, Trixie Dredge, am acting as transcriptionist for Coralie Common, MD.  I have reviewed the above documentation for accuracy and completeness, and I agree with the above. - Jinny Blossom, MD

## 2019-12-07 ENCOUNTER — Other Ambulatory Visit
Admission: RE | Admit: 2019-12-07 | Discharge: 2019-12-07 | Disposition: A | Discharge status: Home / Self Care | Payer: 59 | Source: Ambulatory Visit | Attending: Gastroenterology | Admitting: Gastroenterology

## 2019-12-07 ENCOUNTER — Other Ambulatory Visit: Payer: Self-pay

## 2019-12-07 DIAGNOSIS — Z20822 Contact with and (suspected) exposure to covid-19: Secondary | ICD-10-CM | POA: Diagnosis not present

## 2019-12-07 DIAGNOSIS — Z01812 Encounter for preprocedural laboratory examination: Secondary | ICD-10-CM | POA: Insufficient documentation

## 2019-12-08 LAB — SARS CORONAVIRUS 2 (TAT 6-24 HRS): SARS Coronavirus 2: NEGATIVE

## 2019-12-09 MED FILL — ATROPINE 1% EYE DROPS: 1 | 90 days supply | Qty: 5 | Fill #3

## 2019-12-11 ENCOUNTER — Ambulatory Visit
Admission: RE | Admit: 2019-12-11 | Discharge: 2019-12-11 | Disposition: A | Discharge status: Home / Self Care | Payer: 59 | Attending: Gastroenterology | Admitting: Gastroenterology

## 2019-12-11 ENCOUNTER — Other Ambulatory Visit: Payer: Self-pay

## 2019-12-11 ENCOUNTER — Encounter: Payer: Self-pay | Admitting: Family

## 2019-12-11 ENCOUNTER — Encounter
Admission: RE | Disposition: A | Discharge status: Home / Self Care | Payer: Self-pay | Source: Home / Self Care | Attending: Gastroenterology

## 2019-12-11 DIAGNOSIS — D509 Iron deficiency anemia, unspecified: Secondary | ICD-10-CM | POA: Diagnosis not present

## 2019-12-11 HISTORY — PX: GIVENS CAPSULE STUDY: SHX5432

## 2019-12-11 SURGERY — IMAGING PROCEDURE, GI TRACT, INTRALUMINAL, VIA CAPSULE

## 2019-12-11 MED ORDER — PROPOFOL 10 MG/ML IV BOLUS
INTRAVENOUS | Status: AC
  Filled 2019-12-11: qty 40

## 2019-12-13 ENCOUNTER — Encounter: Payer: Self-pay | Admitting: Gastroenterology

## 2019-12-13 ENCOUNTER — Other Ambulatory Visit: Payer: Self-pay | Admitting: Gastroenterology

## 2019-12-13 DIAGNOSIS — D509 Iron deficiency anemia, unspecified: Secondary | ICD-10-CM

## 2019-12-14 DIAGNOSIS — D509 Iron deficiency anemia, unspecified: Secondary | ICD-10-CM | POA: Diagnosis not present

## 2019-12-18 LAB — INTRINSIC FACTOR ANTIBODIES: Intrinsic Factor Abs, Serum: 1 AU/mL (ref 0.0–1.1)

## 2019-12-18 LAB — ANTI-PARIETAL ANTIBODY: Parietal Cell Ab: 19.6 Units (ref 0.0–20.0)

## 2019-12-19 ENCOUNTER — Encounter: Payer: Self-pay | Admitting: Gastroenterology

## 2019-12-26 ENCOUNTER — Encounter (INDEPENDENT_AMBULATORY_CARE_PROVIDER_SITE_OTHER): Payer: Self-pay | Admitting: Family Medicine

## 2019-12-28 ENCOUNTER — Ambulatory Visit (INDEPENDENT_AMBULATORY_CARE_PROVIDER_SITE_OTHER): Payer: 59 | Admitting: Family Medicine

## 2019-12-29 DIAGNOSIS — H5010 Unspecified exotropia: Secondary | ICD-10-CM | POA: Diagnosis not present

## 2019-12-29 DIAGNOSIS — H5310 Unspecified subjective visual disturbances: Secondary | ICD-10-CM | POA: Diagnosis not present

## 2019-12-29 MED FILL — VIT D2 1.25 MG (50,000 UNIT: 1.25 MG | 28 days supply | Qty: 4 | Fill #0

## 2019-12-29 MED FILL — METFORMIN HCL 500 MG TABS: 500 | 30 days supply | Qty: 60 | Fill #0

## 2020-01-01 ENCOUNTER — Ambulatory Visit (INDEPENDENT_AMBULATORY_CARE_PROVIDER_SITE_OTHER): Payer: 59 | Admitting: Bariatrics

## 2020-01-01 ENCOUNTER — Encounter (INDEPENDENT_AMBULATORY_CARE_PROVIDER_SITE_OTHER): Payer: Self-pay | Admitting: Bariatrics

## 2020-01-01 ENCOUNTER — Other Ambulatory Visit: Payer: Self-pay

## 2020-01-01 VITALS — BP 108/71 | HR 82 | Temp 98.2°F | Ht 68.0 in | Wt 203.0 lb

## 2020-01-01 DIAGNOSIS — E559 Vitamin D deficiency, unspecified: Secondary | ICD-10-CM

## 2020-01-01 DIAGNOSIS — E6609 Other obesity due to excess calories: Secondary | ICD-10-CM | POA: Diagnosis not present

## 2020-01-01 DIAGNOSIS — Z6830 Body mass index (BMI) 30.0-30.9, adult: Secondary | ICD-10-CM

## 2020-01-01 DIAGNOSIS — E669 Obesity, unspecified: Secondary | ICD-10-CM | POA: Diagnosis not present

## 2020-01-01 DIAGNOSIS — R7303 Prediabetes: Secondary | ICD-10-CM

## 2020-01-01 DIAGNOSIS — Z9189 Other specified personal risk factors, not elsewhere classified: Secondary | ICD-10-CM

## 2020-01-01 MED ORDER — METFORMIN HCL 500 MG PO TABS: 500.0000 mg | ORAL_TABLET | Freq: Two times a day (BID) | ORAL | 0 refills | Status: DC

## 2020-01-01 MED ORDER — VITAMIN D (ERGOCALCIFEROL) 1.25 MG (50000 UNIT) PO CAPS: ORAL_CAPSULE | ORAL | 0 refills | Status: DC

## 2020-01-03 NOTE — Progress Notes (Signed)
Chief Complaint:   OBESITY Angela [name] is here to discuss her progress with her obesity treatment plan along with follow-up of her obesity related diagnoses. Angela [name] is on the Category 3 Plan and states she is following her eating plan approximately 75% of the time. Angela [name] states she is walking for 10 minutes 4 times per week.  Today's visit was #: 22 Starting weight: 220 lbs Starting date: 10/05/2018 Today's weight: 203 lbs Today's date: 01/01/2020 Total lbs lost to date: 17 lbs Total lbs lost since last in-office visit: 0  Interim History: Brande's weight remains the same since her last visit.  She is doing well with her water.  She has cut back on snacks.  Subjective:   1. Vitamin D deficiency Angela [name]'s Vitamin D level was 39.9 on 11/01/2019. She is currently taking prescription vitamin D 50,000 IU each week. She denies nausea, vomiting or muscle weakness.  2. Prediabetes Angela [name] has a diagnosis of prediabetes based on her elevated HgA1c and was informed this puts her at greater risk of developing diabetes. She continues to work on diet and exercise to decrease her risk of diabetes. She denies nausea or hypoglycemia.  Denies polyphagia.  Lab Results  Component Value Date   HGBA1C 5.5 11/01/2019   Lab Results  Component Value Date   INSULIN 24.4 11/01/2019   INSULIN 24.5 05/25/2019   INSULIN 21.9 02/02/2019   INSULIN 61.1 (H) 10/05/2018   3. At risk for osteoporosis Jaydon is at higher risk of osteopenia and osteoporosis due to Vitamin D deficiency.   Assessment/Plan:   1. Vitamin D deficiency Low Vitamin D level contributes to fatigue and are associated with obesity, breast, and colon cancer. She agrees to continue to take prescription Vitamin D @50 ,000 IU every week and will follow-up for routine testing of Vitamin D, at least 2-3 times per year to avoid over-replacement.  -Refill Vitamin D, Ergocalciferol, (DRISDOL) 1.25 MG (50000 UNIT) CAPS capsule; TAKE 1 CAPSULE BY  MOUTH EVERY 7 DAYS  Dispense: 4 capsule; Refill: 0  2. Prediabetes Angela [name] will continue to work on weight loss, exercise, and decreasing simple carbohydrates to help decrease the risk of diabetes.   -Refill metFORMIN (GLUCOPHAGE) 500 MG tablet; Take 1 tablet (500 mg total) by mouth 2 (two) times daily with a meal.  Dispense: 60 tablet; Refill: 0  3. At risk for osteoporosis Angela [name] was given approximately 15 minutes of osteoporosis prevention counseling today. Angela [name] is at risk for osteopenia and osteoporosis due to her Vitamin D deficiency. She was encouraged to take her Vitamin D and follow her higher calcium diet and increase strengthening exercise to help strengthen her bones and decrease her risk of osteopenia and osteoporosis.  Repetitive spaced learning was employed today to elicit superior memory formation and behavioral change.  4. Class 1 obesity with serious comorbidity and body mass index (BMI) of 30.0 to 30.9 in adult, unspecified obesity type Angela [name] is currently in the action stage of change. As such, her goal is to continue with weight loss efforts. She has agreed to the Category 3 Plan.   She will work on meal planning, intentional eating, and increasing her protein.  Exercise goals: Walk 3-4 times per week.  She has not been doing this due to moving.  Behavioral modification strategies: increasing lean protein intake, decreasing simple carbohydrates, increasing vegetables, increasing water intake, decreasing eating out, no skipping meals, meal planning and cooking strategies, keeping healthy foods in the home and planning for success.  Brad has  agreed to follow-up with our clinic in 2 weeks, with Dr. Jearld Shines. She was informed of the importance of frequent follow-up visits to maximize her success with intensive lifestyle modifications for her multiple health conditions.   Objective:   Blood pressure 108/71, pulse 82, temperature 98.2 F (36.8 C), height 5\' 8"  (1.727 m),  weight 203 lb (92.1 kg), last menstrual period 12/05/2019, SpO2 100 %. Body mass index is 30.87 kg/m.  General: Cooperative, alert, well developed, in no acute distress. HEENT: Conjunctivae and lids unremarkable. Cardiovascular: Regular rhythm.  Lungs: Normal work of breathing. Neurologic: No focal deficits.   Lab Results  Component Value Date   CREATININE 0.73 11/01/2019   BUN 11 11/01/2019   NA 140 11/01/2019   K 5.0 11/01/2019   CL 107 (H) 11/01/2019   CO2 20 11/01/2019   Lab Results  Component Value Date   ALT 24 11/01/2019   AST 22 11/01/2019   ALKPHOS 65 11/01/2019   BILITOT 0.2 11/01/2019   Lab Results  Component Value Date   HGBA1C 5.5 11/01/2019   HGBA1C 5.5 05/25/2019   HGBA1C 5.5 02/02/2019   HGBA1C 5.7 (H) 10/05/2018   HGBA1C 5.9 06/22/2018   Lab Results  Component Value Date   INSULIN 24.4 11/01/2019   INSULIN 24.5 05/25/2019   INSULIN 21.9 02/02/2019   INSULIN 61.1 (H) 10/05/2018   Lab Results  Component Value Date   TSH 2.020 10/05/2018   Lab Results  Component Value Date   CHOL 142 11/20/2019   HDL 56.60 11/20/2019   LDLCALC 65 11/20/2019   TRIG 105.0 11/20/2019   CHOLHDL 3 11/20/2019   Lab Results  Component Value Date   WBC 5.4 11/20/2019   HGB 11.3 (L) 11/20/2019   HCT 33.5 (L) 11/20/2019   MCV 82.9 11/20/2019   PLT 215.0 11/20/2019   Lab Results  Component Value Date   IRON 58 05/25/2019   TIBC 319 05/25/2019   FERRITIN 39 05/25/2019   Attestation Statements:   Reviewed by clinician on day of visit: allergies, medications, problem list, medical history, surgical history, family history, social history, and previous encounter notes.  I, Water quality scientist, CMA, am acting as Location manager for [employer], DO  I have reviewed the above documentation for accuracy and completeness, and I agree with the above. Jearld Lesch, DO

## 2020-01-04 ENCOUNTER — Encounter (INDEPENDENT_AMBULATORY_CARE_PROVIDER_SITE_OTHER): Payer: Self-pay | Admitting: Bariatrics

## 2020-01-04 DIAGNOSIS — E6609 Other obesity due to excess calories: Secondary | ICD-10-CM | POA: Insufficient documentation

## 2020-01-04 HISTORY — DX: Other obesity due to excess calories: E66.09

## 2020-01-18 ENCOUNTER — Ambulatory Visit (INDEPENDENT_AMBULATORY_CARE_PROVIDER_SITE_OTHER): Payer: 59 | Admitting: Family Medicine

## 2020-01-22 ENCOUNTER — Ambulatory Visit: Payer: 59

## 2020-01-23 ENCOUNTER — Encounter (INDEPENDENT_AMBULATORY_CARE_PROVIDER_SITE_OTHER): Payer: Self-pay | Admitting: Family Medicine

## 2020-01-23 ENCOUNTER — Other Ambulatory Visit (INDEPENDENT_AMBULATORY_CARE_PROVIDER_SITE_OTHER): Payer: Self-pay | Admitting: Family Medicine

## 2020-01-23 ENCOUNTER — Other Ambulatory Visit: Payer: Self-pay

## 2020-01-23 ENCOUNTER — Ambulatory Visit (INDEPENDENT_AMBULATORY_CARE_PROVIDER_SITE_OTHER): Payer: 59 | Admitting: Family Medicine

## 2020-01-23 VITALS — BP 122/85 | HR 77 | Temp 98.2°F | Ht 68.0 in | Wt 203.0 lb

## 2020-01-23 DIAGNOSIS — E559 Vitamin D deficiency, unspecified: Secondary | ICD-10-CM

## 2020-01-23 DIAGNOSIS — E669 Obesity, unspecified: Secondary | ICD-10-CM

## 2020-01-23 DIAGNOSIS — Z6830 Body mass index (BMI) 30.0-30.9, adult: Secondary | ICD-10-CM

## 2020-01-23 DIAGNOSIS — R7303 Prediabetes: Secondary | ICD-10-CM | POA: Diagnosis not present

## 2020-01-23 DIAGNOSIS — Z9189 Other specified personal risk factors, not elsewhere classified: Secondary | ICD-10-CM | POA: Diagnosis not present

## 2020-01-23 MED ORDER — WEGOVY 0.25 MG/0.5ML ~~LOC~~ SOAJ: 0.2500 mg | SUBCUTANEOUS | 0 refills | Status: DC

## 2020-01-23 MED ORDER — VITAMIN D (ERGOCALCIFEROL) 1.25 MG (50000 UNIT) PO CAPS: ORAL_CAPSULE | ORAL | 0 refills | Status: DC

## 2020-01-23 MED FILL — VIT D2 1.25 MG (50,000 UNIT: 1.25 MG | 28 days supply | Qty: 4 | Fill #0

## 2020-01-24 ENCOUNTER — Encounter (INDEPENDENT_AMBULATORY_CARE_PROVIDER_SITE_OTHER): Payer: Self-pay | Admitting: Family Medicine

## 2020-01-24 ENCOUNTER — Ambulatory Visit: Payer: 59 | Admitting: Gastroenterology

## 2020-01-24 NOTE — Progress Notes (Signed)
Chief Complaint:   OBESITY Angela [name] is here to discuss her progress with her obesity treatment plan along with follow-up of her obesity related diagnoses. Angela [name] is on the Category 3 Plan and states she is following her eating plan approximately 75% of the time. Angela [name] states she is acive while doing house projects for 4 hours 3 times per week.  Today's visit was #: 23 Starting weight: 220 lbs Starting date: 10/05/2018 Today's weight: 203 lbs Today's date: 01/23/2020 Total lbs lost to date: 17 Total lbs lost since last in-office visit: 0  Interim History: Angela [name] moved 10 days ago, and she got a puppy 4 days ago and had to go to the emergency department with her husband for hernia over the last few weeks. She does report that she doesn't think she has eaten enough protein.  Subjective:   1. Pre-diabetes Angela [name]'s last A1c was 5.5 and insulin 24.4. She is on metformin and denies GI side effects. She is to start Angela [name].  2. Vitamin D deficiency Angela [name] denies nausea, vomiting, or muscle weakness, but she notes fatigue. Last Vit D level was 39.9.  3. At risk for side effect of medication Angela [name] is at risk for drug side effects due to starting Angela [name].  Assessment/Plan:   1. Pre-diabetes Angela [name] will discontinue metformin, and will continue to work on weight loss, exercise, and decreasing simple carbohydrates to help decrease the risk of diabetes. We will repeat labs in early 2022.  2. Vitamin D deficiency Low Vitamin D level contributes to fatigue and are associated with obesity, breast, and colon cancer. We will refill prescription Vitamin D for 1 month. Angela [name] will follow-up for routine testing of Vitamin D, at least 2-3 times per year to avoid over-replacement.  - Vitamin D, Ergocalciferol, (DRISDOL) 1.25 MG (50000 UNIT) CAPS capsule; TAKE 1 CAPSULE BY MOUTH EVERY 7 DAYS  Dispense: 4 capsule; Refill: 0  3. At risk for side effect of medication Angela [name] was given approximately 15  minutes of drug side effect counseling today.  We discussed side effect possibility and risk versus benefits. Angela [name] agreed to the medication and will contact this office if these side effects are intolerable.  Repetitive spaced learning was employed today to elicit superior memory formation and behavioral change.  4. Class 1 obesity with serious comorbidity and body mass index (BMI) of 30.0 to 30.9 in adult, unspecified obesity type Angela [name] is currently in the action stage of change. As such, her goal is to continue with weight loss efforts. She has agreed to the Category 4 Plan or the [place] + 600 calories.   We discussed various medication options to help Angela [name] with her weight loss efforts and we both agreed to start Wegovy 0.25 mg SubQ weekly with no refills.  - Semaglutide-Weight Management (WEGOVY) 0.25 MG/0.5ML SOAJ; Inject 0.25 mg into the skin once a week.  Dispense: 2 mL; Refill: 0  Exercise goals: As is.  Behavioral modification strategies: increasing lean protein intake, meal planning and cooking strategies, keeping healthy foods in the home and planning for success.  Angela [name] has agreed to follow-up with our clinic in 3 to 4 weeks. She was informed of the importance of frequent follow-up visits to maximize her success with intensive lifestyle modifications for her multiple health conditions.   Objective:   Blood pressure 122/85, pulse 77, temperature 98.2 F (36.8 C), height 5\' 8"  (1.727 m), weight 203 lb (92.1 kg), SpO2 100 %. Body mass index is 30.87 kg/m.  General: Cooperative,  alert, well developed, in no acute distress. HEENT: Conjunctivae and lids unremarkable. Cardiovascular: Regular rhythm.  Lungs: Normal work of breathing. Neurologic: No focal deficits.   Lab Results  Component Value Date   CREATININE 0.73 11/01/2019   BUN 11 11/01/2019   NA 140 11/01/2019   K 5.0 11/01/2019   CL 107 (H) 11/01/2019   CO2 20 11/01/2019   Lab Results  Component Value  Date   ALT 24 11/01/2019   AST 22 11/01/2019   ALKPHOS 65 11/01/2019   BILITOT 0.2 11/01/2019   Lab Results  Component Value Date   HGBA1C 5.5 11/01/2019   HGBA1C 5.5 05/25/2019   HGBA1C 5.5 02/02/2019   HGBA1C 5.7 (H) 10/05/2018   HGBA1C 5.9 06/22/2018   Lab Results  Component Value Date   INSULIN 24.4 11/01/2019   INSULIN 24.5 05/25/2019   INSULIN 21.9 02/02/2019   INSULIN 61.1 (H) 10/05/2018   Lab Results  Component Value Date   TSH 2.020 10/05/2018   Lab Results  Component Value Date   CHOL 142 11/20/2019   HDL 56.60 11/20/2019   LDLCALC 65 11/20/2019   TRIG 105.0 11/20/2019   CHOLHDL 3 11/20/2019   Lab Results  Component Value Date   WBC 5.4 11/20/2019   HGB 11.3 (L) 11/20/2019   HCT 33.5 (L) 11/20/2019   MCV 82.9 11/20/2019   PLT 215.0 11/20/2019   Lab Results  Component Value Date   IRON 58 05/25/2019   TIBC 319 05/25/2019   FERRITIN 39 05/25/2019   Attestation Statements:   Reviewed by clinician on day of visit: allergies, medications, problem list, medical history, surgical history, family history, social history, and previous encounter notes.   I, Trixie Dredge, am acting as transcriptionist for Coralie Common, MD.  I have reviewed the above documentation for accuracy and completeness, and I agree with the above. - Jinny Blossom, MD

## 2020-01-24 NOTE — Telephone Encounter (Signed)
Please review

## 2020-01-30 ENCOUNTER — Other Ambulatory Visit (INDEPENDENT_AMBULATORY_CARE_PROVIDER_SITE_OTHER): Payer: Self-pay

## 2020-01-30 ENCOUNTER — Encounter (INDEPENDENT_AMBULATORY_CARE_PROVIDER_SITE_OTHER): Payer: Self-pay

## 2020-01-30 ENCOUNTER — Other Ambulatory Visit (INDEPENDENT_AMBULATORY_CARE_PROVIDER_SITE_OTHER): Payer: Self-pay | Admitting: Family Medicine

## 2020-01-30 DIAGNOSIS — E669 Obesity, unspecified: Secondary | ICD-10-CM

## 2020-01-30 DIAGNOSIS — Z6830 Body mass index (BMI) 30.0-30.9, adult: Secondary | ICD-10-CM

## 2020-01-30 MED ORDER — WEGOVY 0.25 MG/0.5ML ~~LOC~~ SOAJ: 0.2500 mg | SUBCUTANEOUS | 0 refills | Status: DC

## 2020-01-30 MED FILL — WEGOVY 0.25 MG/0.5ML SOAJ: 0.25 | 28 days supply | Qty: 2 | Fill #0

## 2020-02-05 ENCOUNTER — Other Ambulatory Visit: Payer: Self-pay

## 2020-02-06 ENCOUNTER — Ambulatory Visit (INDEPENDENT_AMBULATORY_CARE_PROVIDER_SITE_OTHER): Payer: 59 | Admitting: Gastroenterology

## 2020-02-06 ENCOUNTER — Other Ambulatory Visit: Payer: Self-pay

## 2020-02-06 ENCOUNTER — Encounter: Payer: Self-pay | Admitting: Gastroenterology

## 2020-02-06 VITALS — BP 127/75 | HR 76 | Temp 98.2°F | Ht 68.0 in | Wt 211.5 lb

## 2020-02-06 DIAGNOSIS — N920 Excessive and frequent menstruation with regular cycle: Secondary | ICD-10-CM | POA: Diagnosis not present

## 2020-02-06 DIAGNOSIS — D500 Iron deficiency anemia secondary to blood loss (chronic): Secondary | ICD-10-CM | POA: Diagnosis not present

## 2020-02-06 DIAGNOSIS — D509 Iron deficiency anemia, unspecified: Secondary | ICD-10-CM | POA: Diagnosis not present

## 2020-02-06 NOTE — Progress Notes (Signed)
[name], MD [address]  [place]  [place], [place] [address]  Main: [phone]  Fax: [phone]    Gastroenterology Consultation  Referring Provider:     Pleas Koch, NP Primary Care Physician:  Pleas Koch, NP Primary Gastroenterologist:  Dr. [name] Reason for Consultation:   IDA        HPI:   Angela [name] is a 44 y.o. female referred by Dr. Carlis Abbott, Leticia Penna, NP  for consultation & management of chronic constipation. Patient reports that she has been experiencing irregular bowel habits for last 2 years, reports having hard stools, frequency every 5 days followed by several bowel movements.  She has joined weight loss program with [facility] and trying to eat healthy.  She is trying to incorporate more fiber in her diet.  She states MiraLAX is not working.  She takes Metamucil which does regulate her bowel movements however, she experiences right lower quadrant pain with it in 2 to 3 days after taking Metamucil.  She denies rectal bleeding.  She also has history of iron deficiency for which she was taking oral iron.  She stopped oral replacement due to worsening of constipation.  She is a Software engineer at [employer] She does not smoke or drink alcohol  Follow-up visit 06/05/2019 Patient has been seeing Dr. Leafy Ro for healthy weight and weight loss management.  She has done great with their meal management program and physical activity.  She reports that her constipation has significantly improved.  She continues to take Linzess 145 MCG daily.  She is taking fusion plus in her anemia has resolved.  Iron levels are improving.  She does not have any concerns today  Follow-up visit 02/06/2020 Patient contacted me in early August via MyChart due to worsening of anemia, hemoglobin 11.3.  Therefore, she underwent video capsule endoscopy which came back normal with no source of bleeding identified.  I started her on fusion plus  every other day.  She also reports to me today that her menstrual cycles are heavier than normal.  She has discussed with her GYN regarding possibilities of hysterectomy or hormone therapy, has not come to any consensus yet.  She does not have any GI symptoms today  NSAIDs: None  Antiplts/Anticoagulants/Anti thrombotics: None  GI Procedures:  EGD and colonoscopy 10/27/2018 - Normal duodenal bulb and second portion of the duodenum. - A few gastric polyps. Resected and retrieved. - Normal gastroesophageal junction and esophagus.  - One 5 mm polyp in the cecum, removed with mucosal resection. Resected and retrieved. Clip was placed. - One 8 mm polyp in the ascending colon, removed with a hot snare. Resected and retrieved. Clip was placed. - One 5 mm polyp in the descending colon, removed with a cold snare. Resected and retrieved. - The distal rectum and anal verge are normal on retroflexion view. - The examination was otherwise normal. - Examined portion of the terminal ileum was normal - Mucosal resection was performed. Resection and retrieval were complete.  DIAGNOSIS:  A. STOMACH POLYP X2, FUNDUS; HOT SNARE:  - FUNDIC GLAND POLYP, 2 FRAGMENTS.  - NEGATIVE FOR DYSPLASIA AND MALIGNANCY.   B. COLON POLYP, CECUM; HOT SNARE:  - SESSILE SERRATED POLYP.  - NEGATIVE FOR DYSPLASIA AND MALIGNANCY.   C. COLON POLYP, ASCENDING; HOT SNARE:  - TUBULAR ADENOMA.  - NEGATIVE FOR HIGH-GRADE DYSPLASIA AND MALIGNANCY.   D. COLON POLYP, DESCENDING; COLD SNARE:  - TUBULAR ADENOMA.  - NEGATIVE  FOR HIGH-GRADE DYSPLASIA AND MALIGNANCY.  Past Medical History:  Diagnosis Date  . Allergy Enviromental  . Anemia may/2017  . Asthma    as a child  . Back pain gerd  . Constipation   . Dysuria 11/25/2018  . Glaucoma   . Heart murmur childhood  . IBS (irritable bowel syndrome)   . Joint pain   . Menorrhagia   . Palpitations   . Plantar fasciitis   . Prediabetes   . Swelling    feet, legs  . Torn  meniscus    Right  . Vaginal burning 11/25/2018    Past Surgical History:  Procedure Laterality Date  . CHOLECYSTECTOMY  2005  . COLONOSCOPY WITH PROPOFOL N/A 10/27/2018   Procedure: COLONOSCOPY WITH PROPOFOL;  Surgeon: Lin Landsman, MD;  Location: [facility] ENDOSCOPY;  Service: Gastroenterology;  Laterality: N/A;  . DILATATION & CURETTAGE/HYSTEROSCOPY WITH MYOSURE N/A 06/23/2018   Procedure: DILATATION & CURETTAGE/HYSTEROSCOPY WITH MYOSURE;  Surgeon: Will Bonnet, MD;  Location: [facility] ORS;  Service: Gynecology;  Laterality: N/A;  . DILATION AND CURETTAGE OF UTERUS    . ERCP     3 times  . ESOPHAGOGASTRODUODENOSCOPY (EGD) WITH PROPOFOL  10/27/2018   Procedure: ESOPHAGOGASTRODUODENOSCOPY (EGD) WITH PROPOFOL;  Surgeon: Lin Landsman, MD;  Location: [facility] ENDOSCOPY;  Service: Gastroenterology;;  . EYE SURGERY    . GIVENS CAPSULE STUDY N/A 12/11/2019   Procedure: GIVENS CAPSULE STUDY;  Surgeon: Lin Landsman, MD;  Location[facility] ENDOSCOPY;  Service: Gastroenterology;  Laterality: N/A;  . MENISCUS REPAIR Right    x 2    Current Outpatient Medications:  .  atropine 1 % ophthalmic solution, Place 1 drop into the right eye daily., Disp: , Rfl:  .  brimonidine-timolol (COMBIGAN) 0.2-0.5 % ophthalmic solution, Place 1 drop into the right eye every 12 (twelve) hours. , Disp: , Rfl:  .  cetirizine (ZYRTEC) 10 MG tablet, Take 10 mg by mouth daily as needed for allergies., Disp: , Rfl:  .  ibuprofen (ADVIL,MOTRIN) 600 MG tablet, Take 1 tablet (600 mg total) by mouth every 6 (six) hours as needed for mild pain, moderate pain or cramping., Disp: 30 tablet, Rfl: 0 .  Iron-FA-B Cmp-C-Biot-Probiotic (FUSION PLUS) CAPS, Take 1 capsule by mouth daily., Disp: 30 capsule, Rfl: 2 .  linaclotide (LINZESS) 145 MCG CAPS capsule, TAKE 1 CAPSULE BY MOUTH ONCE DAILY BEFORE BREAKFAST for constipation., Disp: 90 capsule, Rfl: 0 .  psyllium (METAMUCIL) 58.6 % packet, Take 1 packet by mouth daily., Disp: ,  Rfl:  .  Semaglutide-Weight Management (WEGOVY) 0.25 MG/0.5ML SOAJ, Inject 0.25 mg into the skin once a week., Disp: 2 mL, Rfl: 0 .  Vitamin D, Ergocalciferol, (DRISDOL) 1.25 MG (50000 UNIT) CAPS capsule, TAKE 1 CAPSULE BY MOUTH EVERY 7 DAYS, Disp: 4 capsule, Rfl: 0    Family History  Problem Relation Age of Onset  . Colon cancer Mother 59  . Diabetes Mother   . Hypertension Mother   . Arthritis Mother   . Transient ischemic attack Mother   . Liver disease Mother   . Obesity Mother   . Diabetes Father   . Heart disease Father   . Esophageal cancer Maternal Grandmother   . Cancer Maternal Grandmother   . Diabetes Maternal Grandmother   . Alcohol abuse Maternal Grandfather   . Breast cancer Maternal Aunt        mat great aunt     Social History   Tobacco Use  . Smoking status: Never Smoker  .  Smokeless tobacco: Never Used  Vaping Use  . Vaping Use: Never used  Substance Use Topics  . Alcohol use: Yes    Alcohol/week: 2.0 standard drinks    Types: 2 Glasses of wine per week    Comment: occasional  . Drug use: No    Allergies as of 02/06/2020 - Review Complete 02/06/2020  Allergen Reaction Noted  . Quinoa-kale-hemp [alimentum]  10/05/2018    Review of Systems:    All systems reviewed and negative except where noted in HPI.   Physical Exam:  BP 127/75 (BP Location: Left Arm, Patient Position: Sitting, Cuff Size: Normal)   Pulse 76   Temp 98.2 F (36.8 C) (Oral)   Ht 5\' 8"  (1.727 m)   Wt 211 lb 8 oz (95.9 kg)   BMI 32.16 kg/m  No LMP recorded.  General:   Alert,  Well-developed, well-nourished, pleasant and cooperative in NAD Head:  Normocephalic and atraumatic. Eyes:  Sclera clear, no icterus.   Conjunctiva pink. Ears:  Normal auditory acuity. Nose:  No deformity, discharge, or lesions. Mouth:  No deformity or lesions,oropharynx pink & moist. Neck:  Supple; no masses or thyromegaly. Lungs:  Respirations even and unlabored.  Clear throughout to  auscultation.   No wheezes, crackles, or rhonchi. No acute distress. Heart:  Regular rate and rhythm; no murmurs, clicks, rubs, or gallops. Abdomen:  Normal bowel sounds. Soft, non-tender and non-distended without masses, hepatosplenomegaly or hernias noted.  No guarding or rebound tenderness.   Rectal: Not performed Msk:  Symmetrical without gross deformities. Good, equal movement & strength bilaterally. Pulses:  Normal pulses noted. Extremities:  No clubbing or edema.  No cyanosis. Neurologic:  Alert and oriented x3;  grossly normal neurologically. Skin:  Intact without significant lesions or rashes. No jaundice. Psych:  Alert and cooperative. Normal mood and affect.  Imaging Studies: Reviewed  Assessment and Plan:   Gretna [name] is a 44 y.o. female with obesity, prediabetes on Metformin seen in consultation for iron deficiency anemia and chronic constipation.  Chronic constipation TSH is normal Continue high-fiber diet Continue Linzess 145 MCG daily, prescription provided  Iron deficiency anemia: Most likely secondary to menstrual blood loss EGD and colonoscopy are unremarkable, normal terminal ileum Video capsule endoscopy is unremarkable Continue fusion plus every other day Recheck CBC and iron studies today Follow-up with OB/GYN regarding management of menorrhagia which is the source of iron deficiency anemia   Follow up as needed   [name], MD

## 2020-02-07 LAB — IRON,TIBC AND FERRITIN PANEL
Ferritin: 26 ng/mL (ref 15–150)
Iron Saturation: 11 % — ABNORMAL LOW (ref 15–55)
Iron: 38 ug/dL (ref 27–159)
Total Iron Binding Capacity: 359 ug/dL (ref 250–450)
UIBC: 321 ug/dL (ref 131–425)

## 2020-02-07 LAB — CBC
Hematocrit: 36 % (ref 34.0–46.6)
Hemoglobin: 11.6 g/dL (ref 11.1–15.9)
MCH: 27.9 pg (ref 26.6–33.0)
MCHC: 32.2 g/dL (ref 31.5–35.7)
MCV: 87 fL (ref 79–97)
Platelets: 233 10*3/uL (ref 150–450)
RBC: 4.16 x10E6/uL (ref 3.77–5.28)
RDW: 16 % — ABNORMAL HIGH (ref 11.7–15.4)
WBC: 6.4 10*3/uL (ref 3.4–10.8)

## 2020-02-12 ENCOUNTER — Other Ambulatory Visit: Payer: Self-pay | Admitting: Gastroenterology

## 2020-02-12 ENCOUNTER — Other Ambulatory Visit: Payer: Self-pay | Admitting: Primary Care

## 2020-02-12 ENCOUNTER — Ambulatory Visit: Payer: 59 | Attending: Internal Medicine

## 2020-02-12 DIAGNOSIS — Z23 Encounter for immunization: Secondary | ICD-10-CM

## 2020-02-12 DIAGNOSIS — K5909 Other constipation: Secondary | ICD-10-CM

## 2020-02-12 MED FILL — COMBIGAN EYE DROPS: 0.2-0.5 | 30 days supply | Qty: 5 | Fill #2

## 2020-02-12 MED FILL — LINZESS 145 MCG CAPSULE: 145 | 90 days supply | Qty: 90 | Fill #0

## 2020-02-12 NOTE — Progress Notes (Signed)
   Covid-19 Vaccination Clinic  Name:  Angela [name]    MRN: [id number] DOB: 1976-04-18  02/12/2020  Angela [name] was observed post Covid-19 immunization for 15 minutes without incident. She was provided with Vaccine Information Sheet and instruction to access the V-Safe system.   Angela [name] was instructed to call 911 with any severe reactions post vaccine: Marland Kitchen Difficulty breathing  . Swelling of face and throat  . A fast heartbeat  . A bad rash all over body  . Dizziness and weakness

## 2020-02-14 ENCOUNTER — Other Ambulatory Visit (INDEPENDENT_AMBULATORY_CARE_PROVIDER_SITE_OTHER): Payer: Self-pay | Admitting: Family Medicine

## 2020-02-14 ENCOUNTER — Ambulatory Visit (INDEPENDENT_AMBULATORY_CARE_PROVIDER_SITE_OTHER): Payer: 59 | Admitting: Family Medicine

## 2020-02-14 ENCOUNTER — Encounter (INDEPENDENT_AMBULATORY_CARE_PROVIDER_SITE_OTHER): Payer: Self-pay | Admitting: Family Medicine

## 2020-02-14 ENCOUNTER — Other Ambulatory Visit: Payer: Self-pay

## 2020-02-14 VITALS — BP 111/73 | HR 94 | Temp 98.1°F | Ht 68.0 in | Wt 205.0 lb

## 2020-02-14 DIAGNOSIS — Z9189 Other specified personal risk factors, not elsewhere classified: Secondary | ICD-10-CM

## 2020-02-14 DIAGNOSIS — E669 Obesity, unspecified: Secondary | ICD-10-CM | POA: Diagnosis not present

## 2020-02-14 DIAGNOSIS — Z6830 Body mass index (BMI) 30.0-30.9, adult: Secondary | ICD-10-CM | POA: Diagnosis not present

## 2020-02-14 DIAGNOSIS — R7303 Prediabetes: Secondary | ICD-10-CM | POA: Diagnosis not present

## 2020-02-14 DIAGNOSIS — E559 Vitamin D deficiency, unspecified: Secondary | ICD-10-CM

## 2020-02-14 MED ORDER — WEGOVY 0.5 MG/0.5ML ~~LOC~~ SOAJ: 0.5000 mg | SUBCUTANEOUS | 0 refills | Status: DC

## 2020-02-14 MED ORDER — VITAMIN D (ERGOCALCIFEROL) 1.25 MG (50000 UNIT) PO CAPS: ORAL_CAPSULE | ORAL | 0 refills | Status: DC

## 2020-02-14 MED FILL — WEGOVY 0.5 MG/0.5ML SOAJ: 0.5 | 28 days supply | Qty: 2 | Fill #0

## 2020-02-14 MED FILL — VIT D2 1.25 MG (50,000 UNIT: 1.25 MG | 28 days supply | Qty: 4 | Fill #0

## 2020-02-15 NOTE — Progress Notes (Signed)
Chief Complaint:   OBESITY Angela [name] is here to discuss her progress with her obesity treatment plan along with follow-up of her obesity related diagnoses. Angela [name] is on the Category 4 Plan and the [place] and states she is following her eating plan approximately 70% of the time. Angela [name] states she is walking 10 minutes 3 times per week.  Today's visit was #: 24 Starting weight: 220 lbs Starting date: 10/05/2018 Today's weight: 205 lbs Today's date: 02/14/2020 Total lbs lost to date: 15 Total lbs lost since last in-office visit: 0  Interim History: Angela [name] started [facility] - has had 2 injections so far - and does not notice any side effects so far. She has noticed a decrease in sweet cravings. She was traveling over the last few weeks, but has no plans for upcoming travel. She wants to recommit more consistently to the meal plan over the next few weeks.  Subjective:   Vitamin D deficiency.  No nausea, vomiting, or muscle weakness, but endorses fatigue. Angela [name] is on prescription Vitamin D supplementation.    Ref. Range 11/01/2019 14:34  Vitamin D, 25-Hydroxy Latest Ref Range: 30.0 - 100.0 ng/mL 39.9   Prediabetes. Angela [name] has a diagnosis of prediabetes based on her elevated HgA1c and was informed this puts her at greater risk of developing diabetes. She continues to work on diet and exercise to decrease her risk of diabetes. She denies nausea or hypoglycemia. Angela [name] was previously on metformin. Last A1c 5.5.  Lab Results  Component Value Date   HGBA1C 5.5 11/01/2019   Lab Results  Component Value Date   INSULIN 24.4 11/01/2019   INSULIN 24.5 05/25/2019   INSULIN 21.9 02/02/2019   INSULIN 61.1 (H) 10/05/2018   At risk for side effect of medication. Angela [name] is at risk of side effects from increased Wegovy dose.  Assessment/Plan:   Vitamin D deficiency. Low Vitamin D level contributes to fatigue and are associated with obesity, breast, and colon cancer. She  was given a refill on her Vitamin D, Ergocalciferol, (DRISDOL) 1.25 MG (50000 UNIT) CAPS capsule every week #4 with 0 refills and will follow-up for routine testing of Vitamin D, at least 2-3 times per year to avoid over-replacement.   Prediabetes. Samreen will continue to work on weight loss, exercise, and decreasing simple carbohydrates to help decrease the risk of diabetes. Repeat labs will be checked in January 2022.  At risk for side effect of medication. Angela [name] was given approximately 15 minutes of drug side effect counseling today.  We discussed side effect possibility and risk versus benefits. Angela [name] agreed to the medication and will contact this office if these side effects are intolerable.  Repetitive spaced learning was employed today to elicit superior memory formation and behavioral change.  Class 1 obesity with serious comorbidity and body mass index (BMI) of 30.0 to 30.9 in adult, unspecified obesity type. Prescription was given for Semaglutide-Weight Management (WEGOVY) 0.5 MG/0.5ML SOAJ 0.5 mg SQ weekly #2 mL with 0 refills.  Angela [name] is currently in the action stage of change. As such, her goal is to continue with weight loss efforts. She has agreed to the Category 4 Plan and the Vegetarian Plan + 600 calories.   Exercise goals: Aaryana will continue walking 10 minutes 3 times per week.  Behavioral modification strategies: increasing lean protein intake, meal planning and cooking strategies, keeping healthy foods in the home and planning for success.  Angela [name] has agreed to follow-up with our clinic in 2-3 weeks. She was  informed of the importance of frequent follow-up visits to maximize her success with intensive lifestyle modifications for her multiple health conditions.   Objective:   Blood pressure 111/73, pulse 94, temperature 98.1 F (36.7 C), temperature source Oral, height 5\' 8"  (1.727 m), weight 205 lb (93 kg), SpO2 99 %. Body mass index is 31.17 kg/m.  General:  Cooperative, alert, well developed, in no acute distress. HEENT: Conjunctivae and lids unremarkable. Cardiovascular: Regular rhythm.  Lungs: Normal work of breathing. Neurologic: No focal deficits.   Lab Results  Component Value Date   CREATININE 0.73 11/01/2019   BUN 11 11/01/2019   NA 140 11/01/2019   K 5.0 11/01/2019   CL 107 (H) 11/01/2019   CO2 20 11/01/2019   Lab Results  Component Value Date   ALT 24 11/01/2019   AST 22 11/01/2019   ALKPHOS 65 11/01/2019   BILITOT 0.2 11/01/2019   Lab Results  Component Value Date   HGBA1C 5.5 11/01/2019   HGBA1C 5.5 05/25/2019   HGBA1C 5.5 02/02/2019   HGBA1C 5.7 (H) 10/05/2018   HGBA1C 5.9 06/22/2018   Lab Results  Component Value Date   INSULIN 24.4 11/01/2019   INSULIN 24.5 05/25/2019   INSULIN 21.9 02/02/2019   INSULIN 61.1 (H) 10/05/2018   Lab Results  Component Value Date   TSH 2.020 10/05/2018   Lab Results  Component Value Date   CHOL 142 11/20/2019   HDL 56.60 11/20/2019   LDLCALC 65 11/20/2019   TRIG 105.0 11/20/2019   CHOLHDL 3 11/20/2019   Lab Results  Component Value Date   WBC 6.4 02/06/2020   HGB 11.6 02/06/2020   HCT 36.0 02/06/2020   MCV 87 02/06/2020   PLT 233 02/06/2020   Lab Results  Component Value Date   IRON 38 02/06/2020   TIBC 359 02/06/2020   FERRITIN 26 02/06/2020   Attestation Statements:   Reviewed by clinician on day of visit: allergies, medications, problem list, medical history, surgical history, family history, social history, and previous encounter notes.  I, Michaelene Song, am acting as transcriptionist for Coralie Common, MD   I have reviewed the above documentation for accuracy and completeness, and I agree with the above. - Jinny Blossom, MD

## 2020-03-04 ENCOUNTER — Other Ambulatory Visit (INDEPENDENT_AMBULATORY_CARE_PROVIDER_SITE_OTHER): Payer: Self-pay | Admitting: Family Medicine

## 2020-03-04 ENCOUNTER — Encounter (INDEPENDENT_AMBULATORY_CARE_PROVIDER_SITE_OTHER): Payer: Self-pay | Admitting: Family Medicine

## 2020-03-04 ENCOUNTER — Other Ambulatory Visit: Payer: Self-pay

## 2020-03-04 ENCOUNTER — Ambulatory Visit (INDEPENDENT_AMBULATORY_CARE_PROVIDER_SITE_OTHER): Payer: 59 | Admitting: Family Medicine

## 2020-03-04 VITALS — BP 116/74 | HR 79 | Temp 97.4°F | Ht 68.0 in | Wt 203.0 lb

## 2020-03-04 DIAGNOSIS — Z6830 Body mass index (BMI) 30.0-30.9, adult: Secondary | ICD-10-CM | POA: Diagnosis not present

## 2020-03-04 DIAGNOSIS — H4031X3 Glaucoma secondary to eye trauma, right eye, severe stage: Secondary | ICD-10-CM | POA: Diagnosis not present

## 2020-03-04 DIAGNOSIS — Z9189 Other specified personal risk factors, not elsewhere classified: Secondary | ICD-10-CM

## 2020-03-04 DIAGNOSIS — E559 Vitamin D deficiency, unspecified: Secondary | ICD-10-CM | POA: Diagnosis not present

## 2020-03-04 DIAGNOSIS — R7303 Prediabetes: Secondary | ICD-10-CM

## 2020-03-04 DIAGNOSIS — E669 Obesity, unspecified: Secondary | ICD-10-CM

## 2020-03-04 MED ORDER — VITAMIN D (ERGOCALCIFEROL) 1.25 MG (50000 UNIT) PO CAPS: ORAL_CAPSULE | ORAL | 0 refills | Status: DC

## 2020-03-04 MED FILL — VIT D2 1.25 MG (50,000 UNIT: 1.25 MG | 28 days supply | Qty: 4 | Fill #0

## 2020-03-11 NOTE — Progress Notes (Signed)
Chief Complaint:   OBESITY Angela [name] is here to discuss her progress with her obesity treatment plan along with follow-up of her obesity related diagnoses. Angela [name] is on the Category 4 Plan or the Vegetarian Plan + 600 calories and states she is following her eating plan approximately 70-75% of the time. Angela [name] states she is walking 2 miles 3 times per week, and doing home projects.   Today's visit was #: 25 Starting weight: 220 lbs Starting date: 10/05/2018 Today's weight: 203 lbs Today's date: 03/04/2020 Total lbs lost to date: 17 Total lbs lost since last in-office visit: 2  Interim History: Angela [name] has had a good few weeks, walking more and trying to keep up with the plan as strictly as she can. She denies issues with getting all of the food in. She is not traveling for Thanksgiving. She denies GI side effects of Wegovy. She has no plans for the holidays.  Subjective:   1. Vitamin D deficiency Angela [name] denies nausea, vomiting, or muscle weakness, but she notes fatigue. She is on prescription Vit D.  2. Pre-diabetes Angela [name] is on GLP-1, and she denies GI side effects. Last labs in July 2021 showed A1c of 5.5 and insulin of 24.4.  3. At risk for osteoporosis Angela [name] is at higher risk of osteopenia and osteoporosis due to Vitamin D deficiency.   Assessment/Plan:   1. Vitamin D deficiency Low Vitamin D level contributes to fatigue and are associated with obesity, breast, and colon cancer. We will refill prescription Vitamin D for 1 month. Angela [name] will follow-up for routine testing of Vitamin D, at least 2-3 times per year to avoid over-replacement.  - Vitamin D, Ergocalciferol, (DRISDOL) 1.25 MG (50000 UNIT) CAPS capsule; TAKE 1 CAPSULE BY MOUTH EVERY 7 DAYS  Dispense: 4 capsule; Refill: 0  2. Pre-diabetes Angela [name] will continue GLP-1, and will continue to work on weight loss, exercise, and decreasing simple carbohydrates to help decrease the risk of diabetes. We will repeat labs in  January.  3. At risk for osteoporosis Angela [name] was given approximately 15 minutes of osteoporosis prevention counseling today. Angela [name] is at risk for osteopenia and osteoporosis due to her Vitamin D deficiency. She was encouraged to take her Vitamin D and follow her higher calcium diet and increase strengthening exercise to help strengthen her bones and decrease her risk of osteopenia and osteoporosis.  Repetitive spaced learning was employed today to elicit superior memory formation and behavioral change.  4. Class 1 obesity with serious comorbidity and body mass index (BMI) of 30.0 to 30.9 in adult, unspecified obesity type Angela [name] is currently in the action stage of change. As such, her goal is to continue with weight loss efforts. She has agreed to the Category 4 Plan or the [place] + 600 calories.   We discussed various medication options to help Angela [name] with her weight loss efforts and we both agreed to continue Wegovy at 0.50 mg.  Exercise goals: As is.  Behavioral modification strategies: increasing lean protein intake, meal planning and cooking strategies, keeping healthy foods in the home, holiday eating strategies  and planning for success.  Angela [name] has agreed to follow-up with our clinic in 3 weeks. She was informed of the importance of frequent follow-up visits to maximize her success with intensive lifestyle modifications for her multiple health conditions.   Objective:   Blood pressure 116/74, pulse 79, temperature (!) 97.4 F (36.3 C), temperature source Oral, height 5\' 8"  (1.727 m), weight 203 lb (92.1 kg), last menstrual  period 02/29/2020, SpO2 100 %. Body mass index is 30.87 kg/m.  General: Cooperative, alert, well developed, in no acute distress. HEENT: Conjunctivae and lids unremarkable. Cardiovascular: Regular rhythm.  Lungs: Normal work of breathing. Neurologic: No focal deficits.   Lab Results  Component Value Date   CREATININE 0.73 11/01/2019   BUN 11  11/01/2019   NA 140 11/01/2019   K 5.0 11/01/2019   CL 107 (H) 11/01/2019   CO2 20 11/01/2019   Lab Results  Component Value Date   ALT 24 11/01/2019   AST 22 11/01/2019   ALKPHOS 65 11/01/2019   BILITOT 0.2 11/01/2019   Lab Results  Component Value Date   HGBA1C 5.5 11/01/2019   HGBA1C 5.5 05/25/2019   HGBA1C 5.5 02/02/2019   HGBA1C 5.7 (H) 10/05/2018   HGBA1C 5.9 06/22/2018   Lab Results  Component Value Date   INSULIN 24.4 11/01/2019   INSULIN 24.5 05/25/2019   INSULIN 21.9 02/02/2019   INSULIN 61.1 (H) 10/05/2018   Lab Results  Component Value Date   TSH 2.020 10/05/2018   Lab Results  Component Value Date   CHOL 142 11/20/2019   HDL 56.60 11/20/2019   LDLCALC 65 11/20/2019   TRIG 105.0 11/20/2019   CHOLHDL 3 11/20/2019   Lab Results  Component Value Date   WBC 6.4 02/06/2020   HGB 11.6 02/06/2020   HCT 36.0 02/06/2020   MCV 87 02/06/2020   PLT 233 02/06/2020   Lab Results  Component Value Date   IRON 38 02/06/2020   TIBC 359 02/06/2020   FERRITIN 26 02/06/2020   Attestation Statements:   Reviewed by clinician on day of visit: allergies, medications, problem list, medical history, surgical history, family history, social history, and previous encounter notes.   I, Trixie Dredge, am acting as transcriptionist for Coralie Common, MD.  I have reviewed the above documentation for accuracy and completeness, and I agree with the above. - Jinny Blossom, MD

## 2020-03-16 MED FILL — FUSION PLUS CAPSULE: 30 days supply | Qty: 30 | Fill #1

## 2020-03-18 MED FILL — ATROPINE 1% EYE DROPS: 1 | 90 days supply | Qty: 5 | Fill #0

## 2020-03-18 MED FILL — COMBIGAN EYE DROPS: 0.2-0.5 | 90 days supply | Qty: 10 | Fill #0

## 2020-03-28 ENCOUNTER — Encounter (INDEPENDENT_AMBULATORY_CARE_PROVIDER_SITE_OTHER): Payer: Self-pay | Admitting: Family Medicine

## 2020-03-28 ENCOUNTER — Other Ambulatory Visit: Payer: Self-pay

## 2020-03-28 ENCOUNTER — Ambulatory Visit (INDEPENDENT_AMBULATORY_CARE_PROVIDER_SITE_OTHER): Payer: 59 | Admitting: Family Medicine

## 2020-03-28 ENCOUNTER — Other Ambulatory Visit (INDEPENDENT_AMBULATORY_CARE_PROVIDER_SITE_OTHER): Payer: Self-pay | Admitting: Family Medicine

## 2020-03-28 VITALS — BP 114/72 | HR 86 | Temp 98.3°F | Ht 68.0 in | Wt 205.0 lb

## 2020-03-28 DIAGNOSIS — K5900 Constipation, unspecified: Secondary | ICD-10-CM

## 2020-03-28 DIAGNOSIS — E669 Obesity, unspecified: Secondary | ICD-10-CM

## 2020-03-28 DIAGNOSIS — Z9189 Other specified personal risk factors, not elsewhere classified: Secondary | ICD-10-CM | POA: Diagnosis not present

## 2020-03-28 DIAGNOSIS — Z6830 Body mass index (BMI) 30.0-30.9, adult: Secondary | ICD-10-CM

## 2020-03-28 DIAGNOSIS — E559 Vitamin D deficiency, unspecified: Secondary | ICD-10-CM | POA: Diagnosis not present

## 2020-03-28 MED ORDER — WEGOVY 1 MG/0.5ML ~~LOC~~ SOAJ: 1.0000 mg | SUBCUTANEOUS | 0 refills | Status: DC

## 2020-03-28 MED FILL — WEGOVY 1 MG/0.5ML SOAJ: 1 | 28 days supply | Qty: 2 | Fill #0

## 2020-04-01 NOTE — Progress Notes (Signed)
Chief Complaint:   OBESITY Angela [name] is here to discuss her progress with her obesity treatment plan along with follow-up of her obesity related diagnoses. Angela [name] is on the Category 4 Plan or the Vegetarian Plan + 600 calories and states she is following her eating plan approximately 75% of the time. Angela [name] states she is walking 2 miles 7 times per week, and walking an extra mile 2 times per week.   Today's visit was #: 46 Starting weight: 220 lbs Starting date: 10/05/2018 Today's weight: 205 lbs Today's date: 03/28/2020 Total lbs lost to date: 15 Total lbs lost since last in-office visit: 0  Interim History: Angela [name] has been mostly working and then doing some stress eating secondary to work and celebration eating with her husband's birthday and Thanksgiving. She wants to recommit to the meal plan over the next few weeks.  Subjective:   1. Constipation, unspecified constipation type Angela [name] is on Linzess and metamucil, and she denies side effects from [facility].  2. Vitamin D deficiency Angela [name] denies nausea, vomiting, or muscle weakness, but she notes fatigue. Last Vit D level was 39.9 in July 2021.  3. At risk for side effect of medication Angela [name] is at risk for drug side effects due to increasing Wegovy to 1 mg.  Assessment/Plan:   1. Constipation, unspecified constipation type Angela [name] will continue her current medications with no change. She was informed that a decrease in bowel movement frequency is normal while losing weight, but stools should not be hard or painful. Orders and follow up as documented in patient record.   Counseling Getting to Good Bowel Health: Your goal is to have one soft bowel movement each day. Drink at least 8 glasses of water each day. Eat plenty of fiber (goal is over 25 grams each day). It is best to get most of your fiber from dietary sources which includes leafy green vegetables, fresh fruit, and whole grains. You may need to add fiber with the help of OTC  fiber supplements. These include Metamucil, Citrucel, and Flaxseed. If you are still having trouble, try adding Miralax or Magnesium Citrate. If all of these changes do not work, Cabin crew.  2. Vitamin D deficiency Low Vitamin D level contributes to fatigue and are associated with obesity, breast, and colon cancer. Angela [name] agreed to continue taking prescription Vitamin D 50,000 IU every week and will follow-up for routine testing of Vitamin D, at least 2-3 times per year to avoid over-replacement.  3. At risk for side effect of medication Angela [name] was given approximately 15 minutes of drug side effect counseling today.  We discussed side effect possibility and risk versus benefits. Angela [name] agreed to the medication and will contact this office if these side effects are intolerable.  Repetitive spaced learning was employed today to elicit superior memory formation and behavioral change.  4. Class 1 obesity with serious comorbidity and body mass index (BMI) of 30.0 to 30.9 in adult, unspecified obesity type Angela [name] is currently in the action stage of change. As such, her goal is to continue with weight loss efforts. She has agreed to the Category 3 Plan or the Category 4 Plan.   We discussed various medication options to help Angela [name] with her weight loss efforts and we both agreed to increase Wegovy to 1 mg SubQ weekly and we will refill for 1 month.  - Semaglutide-Weight Management (WEGOVY) 1 MG/0.5ML SOAJ; Inject 1 mg into the skin once a week.  Dispense: 2 mL; Refill: 0  Exercise goals: As is.  Behavioral modification strategies: increasing lean protein intake, meal planning and cooking strategies, keeping healthy foods in the home and holiday eating strategies .  Angela [name] has agreed to follow-up with our clinic in 4 weeks. She was informed of the importance of frequent follow-up visits to maximize her success with intensive lifestyle modifications for her multiple health conditions.    Objective:   Blood pressure 114/72, pulse 86, temperature 98.3 F (36.8 C), temperature source Oral, height 5\' 8"  (1.727 m), weight 205 lb (93 kg), last menstrual period 02/29/2020, SpO2 100 %. Body mass index is 31.17 kg/m.  General: Cooperative, alert, well developed, in no acute distress. HEENT: Conjunctivae and lids unremarkable. Cardiovascular: Regular rhythm.  Lungs: Normal work of breathing. Neurologic: No focal deficits.   Lab Results  Component Value Date   CREATININE 0.73 11/01/2019   BUN 11 11/01/2019   NA 140 11/01/2019   K 5.0 11/01/2019   CL 107 (H) 11/01/2019   CO2 20 11/01/2019   Lab Results  Component Value Date   ALT 24 11/01/2019   AST 22 11/01/2019   ALKPHOS 65 11/01/2019   BILITOT 0.2 11/01/2019   Lab Results  Component Value Date   HGBA1C 5.5 11/01/2019   HGBA1C 5.5 05/25/2019   HGBA1C 5.5 02/02/2019   HGBA1C 5.7 (H) 10/05/2018   HGBA1C 5.9 06/22/2018   Lab Results  Component Value Date   INSULIN 24.4 11/01/2019   INSULIN 24.5 05/25/2019   INSULIN 21.9 02/02/2019   INSULIN 61.1 (H) 10/05/2018   Lab Results  Component Value Date   TSH 2.020 10/05/2018   Lab Results  Component Value Date   CHOL 142 11/20/2019   HDL 56.60 11/20/2019   LDLCALC 65 11/20/2019   TRIG 105.0 11/20/2019   CHOLHDL 3 11/20/2019   Lab Results  Component Value Date   WBC 6.4 02/06/2020   HGB 11.6 02/06/2020   HCT 36.0 02/06/2020   MCV 87 02/06/2020   PLT 233 02/06/2020   Lab Results  Component Value Date   IRON 38 02/06/2020   TIBC 359 02/06/2020   FERRITIN 26 02/06/2020   Attestation Statements:   Reviewed by clinician on day of visit: allergies, medications, problem list, medical history, surgical history, family history, social history, and previous encounter notes.   I, Trixie Dredge, am acting as transcriptionist for Coralie Common, MD.  I have reviewed the above documentation for accuracy and completeness, and I agree with the above. -  Jinny Blossom, MD

## 2020-04-23 ENCOUNTER — Other Ambulatory Visit: Payer: Self-pay

## 2020-04-23 ENCOUNTER — Other Ambulatory Visit (INDEPENDENT_AMBULATORY_CARE_PROVIDER_SITE_OTHER): Payer: Self-pay | Admitting: Family Medicine

## 2020-04-23 ENCOUNTER — Encounter (INDEPENDENT_AMBULATORY_CARE_PROVIDER_SITE_OTHER): Payer: Self-pay | Admitting: Family Medicine

## 2020-04-23 ENCOUNTER — Ambulatory Visit (INDEPENDENT_AMBULATORY_CARE_PROVIDER_SITE_OTHER): Payer: 59 | Admitting: Family Medicine

## 2020-04-23 VITALS — BP 106/72 | HR 90 | Temp 98.1°F | Ht 68.0 in | Wt 204.0 lb

## 2020-04-23 DIAGNOSIS — Z9189 Other specified personal risk factors, not elsewhere classified: Secondary | ICD-10-CM | POA: Diagnosis not present

## 2020-04-23 DIAGNOSIS — E8881 Metabolic syndrome: Secondary | ICD-10-CM | POA: Diagnosis not present

## 2020-04-23 DIAGNOSIS — E669 Obesity, unspecified: Secondary | ICD-10-CM | POA: Diagnosis not present

## 2020-04-23 DIAGNOSIS — Z6831 Body mass index (BMI) 31.0-31.9, adult: Secondary | ICD-10-CM | POA: Diagnosis not present

## 2020-04-23 DIAGNOSIS — E559 Vitamin D deficiency, unspecified: Secondary | ICD-10-CM

## 2020-04-23 MED ORDER — WEGOVY 1 MG/0.5ML ~~LOC~~ SOAJ: 1.0000 mg | SUBCUTANEOUS | 0 refills | Status: DC

## 2020-04-23 MED ORDER — VITAMIN D (ERGOCALCIFEROL) 1.25 MG (50000 UNIT) PO CAPS: ORAL_CAPSULE | ORAL | 0 refills | Status: DC

## 2020-04-23 MED FILL — WEGOVY 1 MG/0.5ML SOAJ: 1 | 28 days supply | Qty: 2 | Fill #0

## 2020-04-23 MED FILL — VIT D2 1.25 MG (50,000 UNIT: 1.25 MG | 28 days supply | Qty: 4 | Fill #0

## 2020-04-24 NOTE — Progress Notes (Signed)
Chief Complaint:   OBESITY Angela [name] is here to discuss her progress with her obesity treatment plan along with follow-up of her obesity related diagnoses. Angela [name] is on the Category 3 Plan and states she is following her eating plan approximately 75% of the time. Angela [name] states she is walking and doing yoga for 15-20 minutes 2-3 per week.  Today's visit was #: 68 Starting weight: 220 lbs Starting date: 10/05/2018 Today's weight: 204 lbs Today's date: 04/23/2020 Total lbs lost to date: 16 lbs Total lbs lost since last in-office visit: 1   Interim History: Angela [name] is on vacation this week. She is having a staycation helping husband recover from surgery. Angela [name] didn't follow much of plan on Christmas and New Years, but esp in last 3-4 days she has been committed to meal plan. She wants to just stick to meal plan for next 2 weeks.  Subjective:   1. Vitamin D deficiency Angela [name]'s Vitamin D level was 39.9 on 11/01/2019. She is currently taking prescription vitamin D 50,000 IU each week. She denies nausea, vomiting or muscle weakness, and notes fatigue.  2. Insulin resistance A1c 5.5, insulin 24.4.  She is on a GLP-1.  Lab Results  Component Value Date   HGBA1C 5.5 11/01/2019   HGBA1C 5.5 05/25/2019   HGBA1C 5.5 02/02/2019   Lab Results  Component Value Date   LDLCALC 65 11/20/2019   CREATININE 0.73 11/01/2019   Lab Results  Component Value Date   INSULIN 24.4 11/01/2019   INSULIN 24.5 05/25/2019   INSULIN 21.9 02/02/2019   INSULIN 61.1 (H) 10/05/2018   3. At risk for side effect of medication Angela [name] was given approximately 15 minutes of drug side effect counseling today.  We discussed side effect possibility and risk versus benefits. Angela [name] agreed to the medication and will contact this office if these side effects are intolerable.  Repetitive spaced learning was employed today to elicit superior memory formation and behavioral change.  Assessment/Plan:   1. Vitamin D  deficiency Low Vitamin D level contributes to fatigue and are associated with obesity, breast, and colon cancer. She agrees to continue to take prescription Vitamin D @50 ,000 IU every week and will follow-up for routine testing of Vitamin D, at least 2-3 times per year to avoid over-replacement.  - Vitamin D, Ergocalciferol, (DRISDOL) 1.25 MG (50000 UNIT) CAPS capsule; TAKE 1 CAPSULE BY MOUTH EVERY 7 DAYS  Dispense: 4 capsule; Refill: 0  2. Insulin resistance Good blood sugar control is important to decrease the likelihood of diabetic complications such as nephropathy, neuropathy, limb loss, blindness, coronary artery disease, and death. Intensive lifestyle modification including diet, exercise and weight loss are the first line of treatment for diabetes. Continue GLP-1. No change in dose.  3. At risk for side effect of medication Angela [name] is at risk for nausea side effect on Wegovy.  4. Class 1 obesity with serious comorbidity and body mass index (BMI) of 31.0 to 31.9 in adult, unspecified obesity type Angela [name] is currently in the action stage of change. As such, her goal is to continue with weight loss efforts. She has agreed to the Category 3 Plan.   We discussed various medication options to help Angela [name] with her weight loss efforts and we both agreed to refill Wegovy 1mg  subq weekly #51mL 0 refill  -Refill Semaglutide-Weight Management (WEGOVY) 1 MG/0.5ML SOAJ; Inject 1 mg into the skin once a week.  Dispense: 2 mL; Refill: 0  Exercise goals: All adults should avoid inactivity. Some physical  activity is better than none, and adults who participate in any amount of physical activity gain some health benefits.  Behavioral modification strategies: increasing lean protein intake, meal planning and cooking strategies, keeping healthy foods in the home and planning for success.  Angela [name] has agreed to follow-up with our clinic in 2-3 weeks. She was informed of the importance of frequent follow-up visits  to maximize her success with intensive lifestyle modifications for her multiple health conditions.   Objective:   Blood pressure 106/72, pulse 90, temperature 98.1 F (36.7 C), temperature source Oral, height 5\' 8"  (1.727 m), weight 204 lb (92.5 kg), last menstrual period 03/31/2020, SpO2 99 %. Body mass index is 31.02 kg/m.  General: Cooperative, alert, well developed, in no acute distress. HEENT: Conjunctivae and lids unremarkable. Cardiovascular: Regular rhythm.  Lungs: Normal work of breathing. Neurologic: No focal deficits.   Lab Results  Component Value Date   CREATININE 0.73 11/01/2019   BUN 11 11/01/2019   NA 140 11/01/2019   K 5.0 11/01/2019   CL 107 (H) 11/01/2019   CO2 20 11/01/2019   Lab Results  Component Value Date   ALT 24 11/01/2019   AST 22 11/01/2019   ALKPHOS 65 11/01/2019   BILITOT 0.2 11/01/2019   Lab Results  Component Value Date   HGBA1C 5.5 11/01/2019   HGBA1C 5.5 05/25/2019   HGBA1C 5.5 02/02/2019   HGBA1C 5.7 (H) 10/05/2018   HGBA1C 5.9 06/22/2018   Lab Results  Component Value Date   INSULIN 24.4 11/01/2019   INSULIN 24.5 05/25/2019   INSULIN 21.9 02/02/2019   INSULIN 61.1 (H) 10/05/2018   Lab Results  Component Value Date   TSH 2.020 10/05/2018   Lab Results  Component Value Date   CHOL 142 11/20/2019   HDL 56.60 11/20/2019   LDLCALC 65 11/20/2019   TRIG 105.0 11/20/2019   CHOLHDL 3 11/20/2019   Lab Results  Component Value Date   WBC 6.4 02/06/2020   HGB 11.6 02/06/2020   HCT 36.0 02/06/2020   MCV 87 02/06/2020   PLT 233 02/06/2020   Lab Results  Component Value Date   IRON 38 02/06/2020   TIBC 359 02/06/2020   FERRITIN 26 02/06/2020   I, 02/08/2020, am acting as Delorse Limber for Energy manager, MD.  I have reviewed the above documentation for accuracy and completeness, and I agree with the above. - Reuben Likes, MD

## 2020-05-06 ENCOUNTER — Telehealth (INDEPENDENT_AMBULATORY_CARE_PROVIDER_SITE_OTHER): Payer: Self-pay

## 2020-05-06 ENCOUNTER — Encounter (INDEPENDENT_AMBULATORY_CARE_PROVIDER_SITE_OTHER): Payer: Self-pay | Admitting: Family Medicine

## 2020-05-06 ENCOUNTER — Telehealth (INDEPENDENT_AMBULATORY_CARE_PROVIDER_SITE_OTHER): Payer: 59 | Admitting: Family Medicine

## 2020-05-06 ENCOUNTER — Other Ambulatory Visit (INDEPENDENT_AMBULATORY_CARE_PROVIDER_SITE_OTHER): Payer: Self-pay | Admitting: Family Medicine

## 2020-05-06 ENCOUNTER — Other Ambulatory Visit: Payer: Self-pay

## 2020-05-06 DIAGNOSIS — Z6831 Body mass index (BMI) 31.0-31.9, adult: Secondary | ICD-10-CM

## 2020-05-06 DIAGNOSIS — E669 Obesity, unspecified: Secondary | ICD-10-CM

## 2020-05-06 DIAGNOSIS — E559 Vitamin D deficiency, unspecified: Secondary | ICD-10-CM

## 2020-05-06 DIAGNOSIS — R7303 Prediabetes: Secondary | ICD-10-CM | POA: Diagnosis not present

## 2020-05-06 MED ORDER — WEGOVY 1.7 MG/0.75ML ~~LOC~~ SOAJ: 1.7000 mg | SUBCUTANEOUS | 0 refills | Status: DC

## 2020-05-06 MED ORDER — VITAMIN D (ERGOCALCIFEROL) 1.25 MG (50000 UNIT) PO CAPS: ORAL_CAPSULE | ORAL | 0 refills | Status: DC

## 2020-05-06 NOTE — Telephone Encounter (Signed)
I connected with  Angela [name] on 05/06/20 by a video enabled telemedicine application and verified that I am speaking with the correct person using two identifiers.   I discussed the limitations of evaluation and management by telemedicine. The patient expressed understanding and agreed to proceed.

## 2020-05-07 ENCOUNTER — Other Ambulatory Visit: Payer: Self-pay | Admitting: Gastroenterology

## 2020-05-07 DIAGNOSIS — K5909 Other constipation: Secondary | ICD-10-CM

## 2020-05-07 MED FILL — WEGOVY 1.7 MG/0.75ML SOAJ: 1.7 | 28 days supply | Qty: 3 | Fill #0

## 2020-05-07 MED FILL — LINZESS 145 MCG CAPSULE: 145 | 30 days supply | Qty: 30 | Fill #0

## 2020-05-08 ENCOUNTER — Ambulatory Visit (INDEPENDENT_AMBULATORY_CARE_PROVIDER_SITE_OTHER): Payer: 59 | Admitting: Family Medicine

## 2020-05-08 NOTE — Progress Notes (Signed)
TeleHealth Visit:  Due to the COVID-19 pandemic, this visit was completed with telemedicine (audio/video) technology to reduce patient and provider exposure as well as to preserve personal protective equipment.   Angela [name] has verbally consented to this TeleHealth visit. The patient is located at home, the provider is located at the [facility] office. The participants in this visit include the listed provider and patient. The visit was conducted today via MyChart video.  Chief Complaint: OBESITY Angela [name] is here to discuss her progress with her obesity treatment plan along with follow-up of her obesity related diagnoses. Angela [name] is on the Category 3 Plan and states she is following her eating plan approximately 80% of the time. Angela [name] states she is hiking 2.5 miles 1 time per week and shoveling snow.  Today's visit was #: 28 Starting weight: 220 lbs Starting date: 10/05/2018  Interim History: Angela [name] reports her weight at home to be 204 pounds.  She is working at home today and tomorrow.  She has walked a bit more, but did have a slice of cake over the past weekend.  No issues finding Wegovy.  Not able to eat as much as Category 4 calls for.  Denies side effects of Wegovy.  Subjective:   1. Vitamin D deficiency Forever's Vitamin D level was 39.9 on 11/01/2019. She is currently taking prescription vitamin D 50,000 IU each week. She denies nausea, vomiting or muscle weakness.  She endorses fatigue.  2. Prediabetes Angela [name] has a diagnosis of prediabetes based on her elevated HgA1c and was informed this puts her at greater risk of developing diabetes. She continues to work on diet and exercise to decrease her risk of diabetes. She denies nausea or hypoglycemia.  Last A1c 5.5, insulin 24.4.  She is not on metformin currently.  Last labs done in July 2021.  Lab Results  Component Value Date   HGBA1C 5.5 11/01/2019   Lab Results  Component Value Date   INSULIN 24.4 11/01/2019    INSULIN 24.5 05/25/2019   INSULIN 21.9 02/02/2019   INSULIN 61.1 (H) 10/05/2018   Assessment/Plan:   1. Vitamin D deficiency Low Vitamin D level contributes to fatigue and are associated with obesity, breast, and colon cancer. She agrees to continue to take prescription Vitamin D @50 ,000 IU every week and will follow-up for routine testing of Vitamin D, at least 2-3 times per year to avoid over-replacement.  -Refill Vitamin D, Ergocalciferol, (DRISDOL) 1.25 MG (50000 UNIT) CAPS capsule; TAKE 1 CAPSULE BY MOUTH EVERY 7 DAYS  Dispense: 4 capsule; Refill: 0  2. Prediabetes Angela [name] will continue to work on weight loss, exercise, and decreasing simple carbohydrates to help decrease the risk of diabetes.  Follow-up labs at next appointment.   3. Class 1 obesity with serious comorbidity and body mass index (BMI) of 31.0 to 31.9 in adult, unspecified obesity type  -Refill Semaglutide-Weight Management (WEGOVY) 1.7 MG/0.75ML SOAJ; Inject 1.7 mg into the skin once a week.  Dispense: 3 mL; Refill: 0  Angela [name] is currently in the action stage of change. As such, her goal is to continue with weight loss efforts. She has agreed to the Category 3 Plan.   Exercise goals: For substantial health benefits, adults should do at least 150 minutes (2 hours and 30 minutes) a week of moderate-intensity, or 75 minutes (1 hour and 15 minutes) a week of vigorous-intensity aerobic physical activity, or an equivalent combination of moderate- and vigorous-intensity aerobic activity. Aerobic activity should be performed in episodes of  at least 10 minutes, and preferably, it should be spread throughout the week.  Behavioral modification strategies: increasing lean protein intake, meal planning and cooking strategies, keeping healthy foods in the home and planning for success.  Angela [name] has agreed to follow-up with our clinic in 2 weeks. She was informed of the importance of frequent follow-up visits to maximize her success with  intensive lifestyle modifications for her multiple health conditions.  Objective:   VITALS: Per patient if applicable, see vitals. GENERAL: Alert and in no acute distress. CARDIOPULMONARY: No increased WOB. Speaking in clear sentences.  PSYCH: Pleasant and cooperative. Speech normal rate and rhythm. Affect is appropriate. Insight and judgement are appropriate. Attention is focused, linear, and appropriate.  NEURO: Oriented as arrived to appointment on time with no prompting.   Lab Results  Component Value Date   CREATININE 0.73 11/01/2019   BUN 11 11/01/2019   NA 140 11/01/2019   K 5.0 11/01/2019   CL 107 (H) 11/01/2019   CO2 20 11/01/2019   Lab Results  Component Value Date   ALT 24 11/01/2019   AST 22 11/01/2019   ALKPHOS 65 11/01/2019   BILITOT 0.2 11/01/2019   Lab Results  Component Value Date   HGBA1C 5.5 11/01/2019   HGBA1C 5.5 05/25/2019   HGBA1C 5.5 02/02/2019   HGBA1C 5.7 (H) 10/05/2018   HGBA1C 5.9 06/22/2018   Lab Results  Component Value Date   INSULIN 24.4 11/01/2019   INSULIN 24.5 05/25/2019   INSULIN 21.9 02/02/2019   INSULIN 61.1 (H) 10/05/2018   Lab Results  Component Value Date   TSH 2.020 10/05/2018   Lab Results  Component Value Date   CHOL 142 11/20/2019   HDL 56.60 11/20/2019   LDLCALC 65 11/20/2019   TRIG 105.0 11/20/2019   CHOLHDL 3 11/20/2019   Lab Results  Component Value Date   WBC 6.4 02/06/2020   HGB 11.6 02/06/2020   HCT 36.0 02/06/2020   MCV 87 02/06/2020   PLT 233 02/06/2020   Lab Results  Component Value Date   IRON 38 02/06/2020   TIBC 359 02/06/2020   FERRITIN 26 02/06/2020   Attestation Statements:   Reviewed by clinician on day of visit: allergies, medications, problem list, medical history, surgical history, family history, social history, and previous encounter notes.  I, Water quality scientist, CMA, am acting as transcriptionist for Coralie Common, MD.  I have reviewed the above documentation for accuracy and  completeness, and I agree with the above. - Jinny Blossom, MD

## 2020-05-10 ENCOUNTER — Other Ambulatory Visit (HOSPITAL_COMMUNITY): Payer: Self-pay | Admitting: Specialist

## 2020-05-10 DIAGNOSIS — M7711 Lateral epicondylitis, right elbow: Secondary | ICD-10-CM | POA: Insufficient documentation

## 2020-05-10 MED FILL — MELOXICAM 15 MG TABLET: 15 | 30 days supply | Qty: 30 | Fill #0

## 2020-05-20 ENCOUNTER — Encounter (INDEPENDENT_AMBULATORY_CARE_PROVIDER_SITE_OTHER): Payer: Self-pay | Admitting: Family Medicine

## 2020-05-22 ENCOUNTER — Ambulatory Visit (INDEPENDENT_AMBULATORY_CARE_PROVIDER_SITE_OTHER): Payer: 59 | Admitting: Family Medicine

## 2020-05-29 ENCOUNTER — Other Ambulatory Visit: Payer: 59

## 2020-05-29 DIAGNOSIS — Z20822 Contact with and (suspected) exposure to covid-19: Secondary | ICD-10-CM

## 2020-05-30 LAB — SARS-COV-2, NAA 2 DAY TAT

## 2020-05-30 LAB — NOVEL CORONAVIRUS, NAA: SARS-CoV-2, NAA: NOT DETECTED

## 2020-06-05 ENCOUNTER — Encounter (INDEPENDENT_AMBULATORY_CARE_PROVIDER_SITE_OTHER): Payer: Self-pay | Admitting: Family Medicine

## 2020-06-05 ENCOUNTER — Ambulatory Visit (INDEPENDENT_AMBULATORY_CARE_PROVIDER_SITE_OTHER): Payer: 59 | Admitting: Family Medicine

## 2020-06-05 ENCOUNTER — Other Ambulatory Visit: Payer: Self-pay

## 2020-06-05 ENCOUNTER — Other Ambulatory Visit (INDEPENDENT_AMBULATORY_CARE_PROVIDER_SITE_OTHER): Payer: Self-pay | Admitting: Family Medicine

## 2020-06-05 VITALS — BP 103/69 | HR 84 | Temp 97.8°F | Ht 68.0 in | Wt 204.0 lb

## 2020-06-05 DIAGNOSIS — E559 Vitamin D deficiency, unspecified: Secondary | ICD-10-CM

## 2020-06-05 DIAGNOSIS — R7303 Prediabetes: Secondary | ICD-10-CM

## 2020-06-05 DIAGNOSIS — Z6831 Body mass index (BMI) 31.0-31.9, adult: Secondary | ICD-10-CM

## 2020-06-05 DIAGNOSIS — E669 Obesity, unspecified: Secondary | ICD-10-CM | POA: Diagnosis not present

## 2020-06-05 DIAGNOSIS — Z9189 Other specified personal risk factors, not elsewhere classified: Secondary | ICD-10-CM

## 2020-06-05 MED ORDER — VITAMIN D (ERGOCALCIFEROL) 1.25 MG (50000 UNIT) PO CAPS: ORAL_CAPSULE | ORAL | 0 refills | Status: DC

## 2020-06-05 MED ORDER — WEGOVY 1.7 MG/0.75ML ~~LOC~~ SOAJ: 1.7000 mg | SUBCUTANEOUS | 0 refills | Status: DC

## 2020-06-05 MED FILL — VIT D2 1.25 MG (50,000 UNIT: 1.25 MG | 28 days supply | Qty: 4 | Fill #0

## 2020-06-06 NOTE — Progress Notes (Signed)
Chief Complaint:   OBESITY Donis is here to discuss her progress with her obesity treatment plan along with follow-up of her obesity related diagnoses. Gerica is on the Category 3 Plan and states she is following her eating plan approximately 50% of the time. Laylana states she is walking 30-60 minutes 2 times per week.  Today's visit was #: 48 Starting weight: 220 lbs Starting date: 10/05/2018 Today's weight: 204 lbs Today's date: 06/05/2020 Total lbs lost to date: 16 lbs Total lbs lost since last in-office visit: 0  Interim History: pt had injection in right elbow for tendinitis/tennis elbow, which helped for about 3 weeks. The weekend of February 4-5, she drove to [place], then found out her husband's aunt died, so she drove to [place]. She has been home for 2 days. She tried following the plan when she is home but when traveling, it was difficult. She has no drive to snack.  Subjective:   1. Vitamin D deficiency Pt denies nausea, vomiting, and muscle weakness but notes fatigue. Pt is on prescription Vit D. Her last Vit D level was 39.9.  2. Pre-diabetes Pt's last A1c was 5.5 and insulin level 24.4. She is on GLP-1 now.  Lab Results  Component Value Date   HGBA1C 5.5 11/01/2019   Lab Results  Component Value Date   INSULIN 24.4 11/01/2019   INSULIN 24.5 05/25/2019   INSULIN 21.9 02/02/2019   INSULIN 61.1 (H) 10/05/2018    3. At risk for osteoporosis Audra is at higher risk of osteopenia and osteoporosis due to Vitamin D deficiency.   Assessment/Plan:   1. Vitamin D deficiency Low Vitamin D level contributes to fatigue and are associated with obesity, breast, and colon cancer. She agrees to continue to take prescription Vitamin D @50 ,000 IU every week and will follow-up for routine testing of Vitamin D, at least 2-3 times per year to avoid over-replacement. - Vitamin D, Ergocalciferol, (DRISDOL) 1.25 MG (50000 UNIT) CAPS capsule; TAKE 1 CAPSULE BY MOUTH EVERY  7 DAYS  Dispense: 4 capsule; Refill: 0  2. Pre-diabetes Ora will continue to work on weight loss, exercise, and decreasing simple carbohydrates to help decrease the risk of diabetes. Continue GLP-1. Check labs at next appointment.  3. At risk for osteoporosis Taneisha was given approximately 15 minutes of osteoporosis prevention counseling today. Altovise is at risk for osteopenia and osteoporosis due to her Vitamin D deficiency. She was encouraged to take her Vitamin D and follow her higher calcium diet and increase strengthening exercise to help strengthen her bones and decrease her risk of osteopenia and osteoporosis.  Repetitive spaced learning was employed today to elicit superior memory formation and behavioral change.  4. Class 1 obesity with serious comorbidity and body mass index (BMI) of 31.0 to 31.9 in adult, unspecified obesity type Suad is currently in the action stage of change. As such, her goal is to continue with weight loss efforts. She has agreed to the Category 3 Plan.   We discussed various medication options to help Helena with her weight loss efforts and we both agreed to continuing Wegovy. - Semaglutide-Weight Management (WEGOVY) 1.7 MG/0.75ML SOAJ; Inject 1.7 mg into the skin once a week.  Dispense: 3 mL; Refill: 0  Exercise goals: All adults should avoid inactivity. Some physical activity is better than none, and adults who participate in any amount of physical activity gain some health benefits.  Behavioral modification strategies: increasing lean protein intake, meal planning and cooking strategies, keeping healthy foods  in the home and planning for success.  Jacaria has agreed to follow-up with our clinic in 3 weeks. She was informed of the importance of frequent follow-up visits to maximize her success with intensive lifestyle modifications for her multiple health conditions.   Objective:   Blood pressure 103/69, pulse 84, temperature 97.8 F (36.6 C),  temperature source Oral, height 5\' 8"  (1.727 m), weight 204 lb (92.5 kg), last menstrual period 05/31/2020, SpO2 98 %. Body mass index is 31.02 kg/m.  General: Cooperative, alert, well developed, in no acute distress. HEENT: Conjunctivae and lids unremarkable. Cardiovascular: Regular rhythm.  Lungs: Normal work of breathing. Neurologic: No focal deficits.   Lab Results  Component Value Date   CREATININE 0.73 11/01/2019   BUN 11 11/01/2019   NA 140 11/01/2019   K 5.0 11/01/2019   CL 107 (H) 11/01/2019   CO2 20 11/01/2019   Lab Results  Component Value Date   ALT 24 11/01/2019   AST 22 11/01/2019   ALKPHOS 65 11/01/2019   BILITOT 0.2 11/01/2019   Lab Results  Component Value Date   HGBA1C 5.5 11/01/2019   HGBA1C 5.5 05/25/2019   HGBA1C 5.5 02/02/2019   HGBA1C 5.7 (H) 10/05/2018   HGBA1C 5.9 06/22/2018   Lab Results  Component Value Date   INSULIN 24.4 11/01/2019   INSULIN 24.5 05/25/2019   INSULIN 21.9 02/02/2019   INSULIN 61.1 (H) 10/05/2018   Lab Results  Component Value Date   TSH 2.020 10/05/2018   Lab Results  Component Value Date   CHOL 142 11/20/2019   HDL 56.60 11/20/2019   LDLCALC 65 11/20/2019   TRIG 105.0 11/20/2019   CHOLHDL 3 11/20/2019   Lab Results  Component Value Date   WBC 6.4 02/06/2020   HGB 11.6 02/06/2020   HCT 36.0 02/06/2020   MCV 87 02/06/2020   PLT 233 02/06/2020   Lab Results  Component Value Date   IRON 38 02/06/2020   TIBC 359 02/06/2020   FERRITIN 26 02/06/2020    Attestation Statements:   Reviewed by clinician on day of visit: allergies, medications, problem list, medical history, surgical history, family history, social history, and previous encounter notes.  Coral Ceo, am acting as transcriptionist for Coralie Common, MD.   I have reviewed the above documentation for accuracy and completeness, and I agree with the above. - Jinny Blossom, MD

## 2020-06-14 MED FILL — LINZESS 145 MCG CAPSULE: 145 | 30 days supply | Qty: 30 | Fill #1

## 2020-06-14 MED FILL — BRIMONIDINE TARTRATE-TIMOLO: 0.2-0.5 | 90 days supply | Qty: 10 | Fill #1

## 2020-06-14 MED FILL — WEGOVY 1.7 MG/0.75ML SOAJ: 1.7 | 28 days supply | Qty: 3 | Fill #0

## 2020-06-14 MED FILL — ATROPINE 1% EYE DROPS: 1 | 90 days supply | Qty: 5 | Fill #1

## 2020-06-22 ENCOUNTER — Encounter (INDEPENDENT_AMBULATORY_CARE_PROVIDER_SITE_OTHER): Payer: Self-pay | Admitting: Family Medicine

## 2020-06-24 DIAGNOSIS — M25521 Pain in right elbow: Secondary | ICD-10-CM | POA: Insufficient documentation

## 2020-06-28 DIAGNOSIS — M25521 Pain in right elbow: Secondary | ICD-10-CM | POA: Diagnosis not present

## 2020-07-01 ENCOUNTER — Other Ambulatory Visit: Payer: Self-pay

## 2020-07-01 ENCOUNTER — Encounter (INDEPENDENT_AMBULATORY_CARE_PROVIDER_SITE_OTHER): Payer: Self-pay | Admitting: Family Medicine

## 2020-07-01 ENCOUNTER — Other Ambulatory Visit (INDEPENDENT_AMBULATORY_CARE_PROVIDER_SITE_OTHER): Payer: Self-pay | Admitting: Family Medicine

## 2020-07-01 ENCOUNTER — Ambulatory Visit (INDEPENDENT_AMBULATORY_CARE_PROVIDER_SITE_OTHER): Payer: 59 | Admitting: Family Medicine

## 2020-07-01 VITALS — BP 128/78 | HR 89 | Temp 98.2°F | Ht 68.0 in | Wt 203.0 lb

## 2020-07-01 DIAGNOSIS — R7303 Prediabetes: Secondary | ICD-10-CM

## 2020-07-01 DIAGNOSIS — Z6833 Body mass index (BMI) 33.0-33.9, adult: Secondary | ICD-10-CM

## 2020-07-01 DIAGNOSIS — M25521 Pain in right elbow: Secondary | ICD-10-CM | POA: Diagnosis not present

## 2020-07-01 DIAGNOSIS — Z9189 Other specified personal risk factors, not elsewhere classified: Secondary | ICD-10-CM

## 2020-07-01 DIAGNOSIS — E669 Obesity, unspecified: Secondary | ICD-10-CM

## 2020-07-01 DIAGNOSIS — E559 Vitamin D deficiency, unspecified: Secondary | ICD-10-CM

## 2020-07-01 MED ORDER — VITAMIN D (ERGOCALCIFEROL) 1.25 MG (50000 UNIT) PO CAPS: ORAL_CAPSULE | ORAL | 0 refills | Status: DC

## 2020-07-01 MED ORDER — WEGOVY 2.4 MG/0.75ML ~~LOC~~ SOAJ: 2.4000 mg | SUBCUTANEOUS | 0 refills | Status: DC

## 2020-07-03 NOTE — Progress Notes (Signed)
Chief Complaint:   OBESITY [name] is here to discuss her progress with her obesity treatment plan along with follow-up of her obesity related diagnoses. Angela [name] is on the Category 3 Plan and states she is following her eating plan approximately 80% of the time. Angela [name] states she is walking 1 mile and doing PT 40 minutes 2-3 times per week.  Today's visit was #: 71 Starting weight: 220 lbs Starting date: 10/05/2018 Today's weight: 203 lbs Today's date: 07/01/2020 Total lbs lost to date: 17 lbs Total lbs lost since last in-office visit: 1 lb  Interim History: Pt has been busy but at home over the last few weeks. She is eating mainly at home over the last few weeks, except 1 time a week. Pt reports she isn't snacking much. She finds herself snacking less mindlessly. She denies hunger between meals. She may wan to start physical activity outdoors.  Subjective:   1. Vitamin D deficiency Pt last Vit D level was 39.9. She denies nausea, vomiting, and muscle weakness but notes fatigue. Pt is on prescription Vit D.  2. Pre-diabetes Pt's last A1c was 5.5 with an insulin level of 19.6. she is on GLP-1 but was previously on Metformin.  Lab Results  Component Value Date   HGBA1C 5.5 11/01/2019   Lab Results  Component Value Date   INSULIN 24.4 11/01/2019   INSULIN 24.5 05/25/2019   INSULIN 21.9 02/02/2019   INSULIN 61.1 (H) 10/05/2018    3. At risk for side effect of medication Angela [name] is at risk of side effects of medication due to increasing Wegovy.   Assessment/Plan:   1. Vitamin D deficiency Low Vitamin D level contributes to fatigue and are associated with obesity, breast, and colon cancer. She agrees to continue to take prescription Vitamin D @50 ,000 IU every week and will follow-up for routine testing of Vitamin D, at least 2-3 times per year to avoid over-replacement.  - Vitamin D, Ergocalciferol, (DRISDOL) 1.25 MG (50000 UNIT) CAPS capsule; TAKE 1 CAPSULE BY MOUTH EVERY 7  DAYS  Dispense: 4 capsule; Refill: 0  2. Pre-diabetes Angela [name] will continue to work on weight loss, exercise, and decreasing simple carbohydrates to help decrease the risk of diabetes. Continue GLP-1 with no change in treatment (increase dose of Wegovy currently).  3. At risk for side effect of medication Angela [name] was given approximately 15 minutes of drug side effect counseling today.  We discussed side effect possibility and risk versus benefits. Angela [name] agreed to the medication and will contact this office if these side effects are intolerable.  Repetitive spaced learning was employed today to elicit superior memory formation and behavioral change.  4. Class 1 obesity with serious comorbidity and body mass index (BMI) of 33.0 to 33.9 in adult, unspecified obesity type Angela [name] is currently in the action stage of change. As such, her goal is to continue with weight loss efforts. She has agreed to the Category 3 Plan with breakfast and lunch and the Category 4 Plan for dinner.   We discussed various medication options to help Angela [name] with her weight loss efforts and we both agreed to increasing Wegovy, as per below. - Semaglutide-Weight Management (WEGOVY) 2.4 MG/0.75ML SOAJ; Inject 2.4 mg into the skin once a week.  Dispense: 3 mL; Refill: 0  Exercise goals: All adults should avoid inactivity. Some physical activity is better than none, and adults who participate in any amount of physical activity gain some health benefits.  Behavioral modification strategies: increasing lean protein intake,  meal planning and cooking strategies, keeping healthy foods in the home and planning for success.  Angela [name] has agreed to follow-up with our clinic in 3 weeks. She was informed of the importance of frequent follow-up visits to maximize her success with intensive lifestyle modifications for her multiple health conditions.   Objective:   Blood pressure 128/78, pulse 89, temperature 98.2 F (36.8 C), temperature  source Oral, height 5\' 8"  (1.727 m), weight 203 lb (92.1 kg), last menstrual period 06/29/2020, SpO2 97 %. Body mass index is 30.87 kg/m.  General: Cooperative, alert, well developed, in no acute distress. HEENT: Conjunctivae and lids unremarkable. Cardiovascular: Regular rhythm.  Lungs: Normal work of breathing. Neurologic: No focal deficits.   Lab Results  Component Value Date   CREATININE 0.73 11/01/2019   BUN 11 11/01/2019   NA 140 11/01/2019   K 5.0 11/01/2019   CL 107 (H) 11/01/2019   CO2 20 11/01/2019   Lab Results  Component Value Date   ALT 24 11/01/2019   AST 22 11/01/2019   ALKPHOS 65 11/01/2019   BILITOT 0.2 11/01/2019   Lab Results  Component Value Date   HGBA1C 5.5 11/01/2019   HGBA1C 5.5 05/25/2019   HGBA1C 5.5 02/02/2019   HGBA1C 5.7 (H) 10/05/2018   HGBA1C 5.9 06/22/2018   Lab Results  Component Value Date   INSULIN 24.4 11/01/2019   INSULIN 24.5 05/25/2019   INSULIN 21.9 02/02/2019   INSULIN 61.1 (H) 10/05/2018   Lab Results  Component Value Date   TSH 2.020 10/05/2018   Lab Results  Component Value Date   CHOL 142 11/20/2019   HDL 56.60 11/20/2019   LDLCALC 65 11/20/2019   TRIG 105.0 11/20/2019   CHOLHDL 3 11/20/2019   Lab Results  Component Value Date   WBC 6.4 02/06/2020   HGB 11.6 02/06/2020   HCT 36.0 02/06/2020   MCV 87 02/06/2020   PLT 233 02/06/2020   Lab Results  Component Value Date   IRON 38 02/06/2020   TIBC 359 02/06/2020   FERRITIN 26 02/06/2020     Attestation Statements:   Reviewed by clinician on day of visit: allergies, medications, problem list, medical history, surgical history, family history, social history, and previous encounter notes.  Coral Ceo, am acting as transcriptionist for Coralie Common, MD.   I have reviewed the above documentation for accuracy and completeness, and I agree with the above. - Jinny Blossom, MD

## 2020-07-04 DIAGNOSIS — M25521 Pain in right elbow: Secondary | ICD-10-CM | POA: Diagnosis not present

## 2020-07-08 DIAGNOSIS — M25521 Pain in right elbow: Secondary | ICD-10-CM | POA: Diagnosis not present

## 2020-07-12 ENCOUNTER — Other Ambulatory Visit (HOSPITAL_BASED_OUTPATIENT_CLINIC_OR_DEPARTMENT_OTHER): Payer: Self-pay

## 2020-07-12 MED FILL — FUSION PLUS CAPSULE: 30 days supply | Qty: 30 | Fill #2

## 2020-07-12 MED FILL — LINZESS 145 MCG CAPSULE: 145 | 30 days supply | Qty: 30 | Fill #2

## 2020-07-16 DIAGNOSIS — M25521 Pain in right elbow: Secondary | ICD-10-CM | POA: Diagnosis not present

## 2020-07-22 ENCOUNTER — Other Ambulatory Visit: Payer: Self-pay

## 2020-07-22 ENCOUNTER — Encounter (INDEPENDENT_AMBULATORY_CARE_PROVIDER_SITE_OTHER): Payer: Self-pay | Admitting: Family Medicine

## 2020-07-22 ENCOUNTER — Ambulatory Visit (INDEPENDENT_AMBULATORY_CARE_PROVIDER_SITE_OTHER): Payer: 59 | Admitting: Family Medicine

## 2020-07-22 ENCOUNTER — Other Ambulatory Visit (HOSPITAL_COMMUNITY): Payer: Self-pay

## 2020-07-22 VITALS — BP 110/72 | HR 77 | Temp 98.3°F | Ht 68.0 in | Wt 199.0 lb

## 2020-07-22 DIAGNOSIS — Z6833 Body mass index (BMI) 33.0-33.9, adult: Secondary | ICD-10-CM | POA: Diagnosis not present

## 2020-07-22 DIAGNOSIS — E669 Obesity, unspecified: Secondary | ICD-10-CM

## 2020-07-22 DIAGNOSIS — Z9189 Other specified personal risk factors, not elsewhere classified: Secondary | ICD-10-CM

## 2020-07-22 DIAGNOSIS — K5909 Other constipation: Secondary | ICD-10-CM

## 2020-07-22 DIAGNOSIS — M25521 Pain in right elbow: Secondary | ICD-10-CM | POA: Diagnosis not present

## 2020-07-22 DIAGNOSIS — E559 Vitamin D deficiency, unspecified: Secondary | ICD-10-CM | POA: Diagnosis not present

## 2020-07-22 MED ORDER — VITAMIN D (ERGOCALCIFEROL) 1.25 MG (50000 UNIT) PO CAPS
ORAL_CAPSULE | ORAL | 0 refills | Status: DC
  Filled 2020-07-22: qty 4, 28d supply, fill #0

## 2020-07-22 MED ORDER — SEMAGLUTIDE-WEIGHT MANAGEMENT 2.4 MG/0.75ML ~~LOC~~ SOAJ
SUBCUTANEOUS | 0 refills | Status: DC
  Filled 2020-07-22 – 2020-08-02 (×2): qty 3, 28d supply, fill #0

## 2020-07-23 ENCOUNTER — Other Ambulatory Visit (HOSPITAL_COMMUNITY): Payer: Self-pay

## 2020-07-25 DIAGNOSIS — M25521 Pain in right elbow: Secondary | ICD-10-CM | POA: Diagnosis not present

## 2020-07-29 DIAGNOSIS — M25521 Pain in right elbow: Secondary | ICD-10-CM | POA: Diagnosis not present

## 2020-07-31 NOTE — Progress Notes (Signed)
Chief Complaint:   OBESITY Angela [name] is here to discuss her progress with her obesity treatment plan along with follow-up of her obesity related diagnoses. Angela [name] is on the Category 3 Plan for breakfast and lunch and the Category 4 Plan for dinner and states she is following her eating plan approximately 80% of the time. Angela [name] states she is doing PT and walking 30 minutes 2 times per week.  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 She is doing well on 2.4 mg Wegovy and did get feeling of hypoglycemia when she was working outside in the yard. She is not always able to get all food in at specific times of meals.  Subjective:   1. Vitamin D deficiency Angela [name]'s last Vit D was 39.9. She denies nausea, vomiting, and muscle weakness but notes fatigue. Pt is on prescription Vit D.  2. Other constipation Angela [name] has a history of constipation and doesn't notice issue with bowel movement frequency with increased dose of Wegovy.  3. At risk for osteoporosis Angela [name] is at higher risk of osteopenia and osteoporosis due to Vitamin D deficiency.   Assessment/Plan:   1. Vitamin D deficiency Low Vitamin D level contributes to fatigue and are associated with obesity, breast, and colon cancer. She agrees to continue to take prescription Vitamin D @50 ,000 IU every week and will follow-up for routine testing of Vitamin D, at least 2-3 times per year to avoid over-replacement.  - Vitamin D, Ergocalciferol, (DRISDOL) 1.25 MG (50000 UNIT) CAPS capsule; TAKE 1 CAPSULE BY MOUTH EVERY 7 DAYS  Dispense: 4 capsule; Refill: 0  2. Other constipation Angela [name] was informed that a decrease in bowel movement frequency is normal while losing weight, but stools  should not be hard or painful. Orders and follow up as documented in patient record. Continue Linzess and fiber.  Counseling Getting to Good Bowel Health: Your goal is to have one soft bowel movement each day. Drink at least 8 glasses of water each day. Eat plenty of fiber (goal is over 25 grams each day). It is best to get most of your fiber from dietary sources which includes leafy green vegetables, fresh fruit, and whole grains. You may need to add fiber with the help of OTC fiber supplements. These include Metamucil, Citrucel, and Flaxseed. If you are still having trouble, try adding Miralax or Magnesium Citrate. If all of these changes do not work, Cabin crew.  3. At risk for osteoporosis Angela [name] was given approximately 15 minutes of osteoporosis prevention counseling today. Angela [name] is at risk for osteopenia and osteoporosis due to her Vitamin D deficiency. She was encouraged to take her Vitamin D and follow her higher calcium diet and increase strengthening exercise to help strengthen her bones and decrease her risk of osteopenia and osteoporosis.  Repetitive spaced learning was employed today to elicit superior memory formation and behavioral change.  4. Class 1 obesity with serious comorbidity and body mass index (BMI) of 33.0 to 33.9 in adult, unspecified obesity type Angela [name] is currently in the action stage of change. As such, her goal is to continue with weight loss efforts. She has agreed to the Category 3 Plan for breakfast and lunch and the Category 4 Plan for dinner.  We discussed various medication options to help Jahara with her weight loss efforts and we both agreed to continue [facility] as prescribed below. - Semaglutide-Weight Management 2.4 MG/0.75ML SOAJ; INJECT 2.4 MG INTO THE SKIN ONCE A WEEK.  Dispense: 3 mL; Refill: 0  Exercise goals: All adults should avoid inactivity. Some physical activity is better than none, and adults who participate in any amount of physical  activity gain some health benefits.  Behavioral modification strategies: increasing lean protein intake, meal planning and cooking strategies and keeping healthy foods in the home.  Angela [name] has agreed to follow-up with our clinic in 3-4 weeks. She was informed of the importance of frequent follow-up visits to maximize her success with intensive lifestyle modifications for her multiple health conditions.   Objective:   Blood pressure 110/72, pulse 77, temperature 98.3 F (36.8 C), height 5\' 8"  (1.727 m), weight 199 lb (90.3 kg), last menstrual period 06/29/2020, SpO2 96 %. Body mass index is 30.26 kg/m.  General: Cooperative, alert, well developed, in no acute distress. HEENT: Conjunctivae and lids unremarkable. Cardiovascular: Regular rhythm.  Lungs: Normal work of breathing. Neurologic: No focal deficits.   Lab Results  Component Value Date   CREATININE 0.73 11/01/2019   BUN 11 11/01/2019   NA 140 11/01/2019   K 5.0 11/01/2019   CL 107 (H) 11/01/2019   CO2 20 11/01/2019   Lab Results  Component Value Date   ALT 24 11/01/2019   AST 22 11/01/2019   ALKPHOS 65 11/01/2019   BILITOT 0.2 11/01/2019   Lab Results  Component Value Date   HGBA1C 5.5 11/01/2019   HGBA1C 5.5 05/25/2019   HGBA1C 5.5 02/02/2019   HGBA1C 5.7 (H) 10/05/2018   HGBA1C 5.9 06/22/2018   Lab Results  Component Value Date   INSULIN 24.4 11/01/2019   INSULIN 24.5 05/25/2019   INSULIN 21.9 02/02/2019   INSULIN 61.1 (H) 10/05/2018   Lab Results  Component Value Date   TSH 2.020 10/05/2018   Lab Results  Component Value Date   CHOL 142 11/20/2019   HDL 56.60 11/20/2019   LDLCALC 65 11/20/2019   TRIG 105.0 11/20/2019   CHOLHDL 3 11/20/2019   Lab Results  Component Value Date   WBC 6.4 02/06/2020   HGB 11.6 02/06/2020   HCT 36.0 02/06/2020   MCV 87 02/06/2020   PLT 233 02/06/2020   Lab Results  Component Value Date   IRON 38 02/06/2020   TIBC 359 02/06/2020   FERRITIN 26 02/06/2020      Attestation Statements:   Reviewed by clinician on day of visit: allergies, medications, problem list, medical history, surgical history, family history, social history, and previous encounter notes.  Coral Ceo, am acting as transcriptionist for Coralie Common, MD.   I have reviewed the above documentation for accuracy and completeness, and I agree with the above. - Jinny Blossom, MD

## 2020-08-01 DIAGNOSIS — M25521 Pain in right elbow: Secondary | ICD-10-CM | POA: Diagnosis not present

## 2020-08-03 ENCOUNTER — Other Ambulatory Visit (HOSPITAL_COMMUNITY): Payer: Self-pay

## 2020-08-12 ENCOUNTER — Other Ambulatory Visit: Payer: Self-pay | Admitting: Gastroenterology

## 2020-08-12 ENCOUNTER — Ambulatory Visit (INDEPENDENT_AMBULATORY_CARE_PROVIDER_SITE_OTHER): Payer: 59 | Admitting: Family Medicine

## 2020-08-12 DIAGNOSIS — K5909 Other constipation: Secondary | ICD-10-CM

## 2020-08-13 ENCOUNTER — Other Ambulatory Visit (HOSPITAL_COMMUNITY): Payer: Self-pay

## 2020-08-13 MED ORDER — LINACLOTIDE 145 MCG PO CAPS
ORAL_CAPSULE | ORAL | 0 refills | Status: DC
  Filled 2020-08-13: qty 90, 90d supply, fill #0

## 2020-08-15 DIAGNOSIS — M25521 Pain in right elbow: Secondary | ICD-10-CM | POA: Diagnosis not present

## 2020-08-22 DIAGNOSIS — M25521 Pain in right elbow: Secondary | ICD-10-CM | POA: Diagnosis not present

## 2020-08-27 ENCOUNTER — Other Ambulatory Visit (HOSPITAL_COMMUNITY): Payer: Self-pay

## 2020-08-27 ENCOUNTER — Ambulatory Visit (INDEPENDENT_AMBULATORY_CARE_PROVIDER_SITE_OTHER): Payer: 59 | Admitting: Family Medicine

## 2020-08-27 ENCOUNTER — Encounter (INDEPENDENT_AMBULATORY_CARE_PROVIDER_SITE_OTHER): Payer: Self-pay | Admitting: Family Medicine

## 2020-08-27 ENCOUNTER — Other Ambulatory Visit: Payer: Self-pay

## 2020-08-27 VITALS — BP 104/67 | HR 95 | Temp 98.1°F | Ht 68.0 in | Wt 200.0 lb

## 2020-08-27 DIAGNOSIS — Z9189 Other specified personal risk factors, not elsewhere classified: Secondary | ICD-10-CM

## 2020-08-27 DIAGNOSIS — M778 Other enthesopathies, not elsewhere classified: Secondary | ICD-10-CM

## 2020-08-27 DIAGNOSIS — E559 Vitamin D deficiency, unspecified: Secondary | ICD-10-CM | POA: Diagnosis not present

## 2020-08-27 DIAGNOSIS — Z6833 Body mass index (BMI) 33.0-33.9, adult: Secondary | ICD-10-CM

## 2020-08-27 DIAGNOSIS — E669 Obesity, unspecified: Secondary | ICD-10-CM | POA: Diagnosis not present

## 2020-08-27 MED ORDER — VITAMIN D (ERGOCALCIFEROL) 1.25 MG (50000 UNIT) PO CAPS
ORAL_CAPSULE | ORAL | 0 refills | Status: DC
  Filled 2020-08-27: qty 4, 28d supply, fill #0

## 2020-08-27 MED ORDER — SEMAGLUTIDE-WEIGHT MANAGEMENT 2.4 MG/0.75ML ~~LOC~~ SOAJ
SUBCUTANEOUS | 0 refills | Status: DC
  Filled 2020-08-27: qty 3, 28d supply, fill #0

## 2020-08-27 NOTE — Progress Notes (Signed)
Chief Complaint:   OBESITY Angela [name] is here to discuss her progress with her obesity treatment plan along with follow-up of her obesity related diagnoses. Angela [name] is on the Category 3 Plan for breakfast and lunch and the Category 4 Plan for dinner and states she is following her eating plan approximately 70% of the time. Angela [name] states she is walking and resistance bands 20 minutes 2-3 times per week.  Today's visit was #: 61 Starting weight: 220 lbs Starting date: 10/05/2018 Today's weight: 200 lbs Today's date: 08/27/2020 Total lbs lost to date: 20 Total lbs lost since last in-office visit: 0  Interim History: Angela [name] is under some work stress with a coworker being out due to Angela [name]. She has been doing some increased sweets eating due to stress. Pt voices this week will be fairly busy. She is going on a mission trip to [place] 20-29th of May.  Subjective:   1. Vitamin D deficiency Pt denies nausea, vomiting, and muscle weakness but notes fatigue. Pt is on prescription Vit D.  2. Right elbow tendinitis Pt is still going to PT every other week. Her pain level has improved. She is still working on Char[employer] fundraiser.  3. At risk for osteoporosis Angela [name] is at higher risk of osteopenia and osteoporosis due to Vitamin D deficiency.   Assessment/Plan:   1. Vitamin D deficiency Low Vitamin D level contributes to fatigue and are associated with obesity, breast, and colon cancer. She agrees to continue to take prescription Vitamin D @50 ,000 IU every week and will follow-up for routine testing of Vitamin D, at least 2-3 times per year to avoid over-replacement.  - Vitamin D, Ergocalciferol, (DRISDOL) 1.25 MG (50000 UNIT) CAPS capsule; TAKE 1 CAPSULE BY MOUTH EVERY 7 DAYS  Dispense: 4 capsule; Refill: 0  2. Right elbow tendinitis Follow up with PT.  3. At risk for osteoporosis Angela [name] was given approximately 15 minutes of osteoporosis prevention counseling today. Angela [name] is at risk for  osteopenia and osteoporosis due to her Vitamin D deficiency. She was encouraged to take her Vitamin D and follow her higher calcium diet and increase strengthening exercise to help strengthen her bones and decrease her risk of osteopenia and osteoporosis.  Repetitive spaced learning was employed today to elicit superior memory formation and behavioral change.  4. Class 1 obesity with serious comorbidity and body mass index (BMI) of 33.0 to 33.9 in adult, unspecified obesity type  Angela [name] is currently in the action stage of change. As such, her goal is to continue with weight loss efforts. She has agreed to keeping a food journal and adhering to [place] [address] calories and 125+ grams protein.   We discussed various medication options to help Angela [name] with her weight loss efforts and we both agreed to continue Angela [name], as prescribed below. - Semaglutide-Weight Management 2.4 MG/0.75ML SOAJ; INJECT 2.4 MG INTO THE SKIN ONCE A WEEK.  Dispense: 3 mL; Refill: 0  Exercise goals: As is  Behavioral modification strategies: increasing lean protein intake, meal planning and cooking strategies and keeping healthy foods in the home.  Angela [name] has agreed to follow-up with Angela [name] in 4-5 weeks. She was informed of the importance of frequent follow-up visits to maximize her success with intensive lifestyle modifications for her multiple health conditions.   Objective:   Blood pressure 104/67, pulse 95, temperature 98.1 F (36.7 C), height 5\' 8"  (1.727 m), weight 200 lb (90.7 kg), SpO2 100 %. Body mass index is 30.41 kg/m.  General: Cooperative,  alert, well developed, in no acute distress. HEENT: Conjunctivae and lids unremarkable. Cardiovascular: Regular rhythm.  Lungs: Normal work of breathing. Neurologic: No focal deficits.   Lab Results  Component Value Date   CREATININE 0.73 11/01/2019   BUN 11 11/01/2019   NA 140 11/01/2019   K 5.0 11/01/2019   CL 107 (H) 11/01/2019   CO2 20  11/01/2019   Lab Results  Component Value Date   ALT 24 11/01/2019   AST 22 11/01/2019   ALKPHOS 65 11/01/2019   BILITOT 0.2 11/01/2019   Lab Results  Component Value Date   HGBA1C 5.5 11/01/2019   HGBA1C 5.5 05/25/2019   HGBA1C 5.5 02/02/2019   HGBA1C 5.7 (H) 10/05/2018   HGBA1C 5.9 06/22/2018   Lab Results  Component Value Date   INSULIN 24.4 11/01/2019   INSULIN 24.5 05/25/2019   INSULIN 21.9 02/02/2019   INSULIN 61.1 (H) 10/05/2018   Lab Results  Component Value Date   TSH 2.020 10/05/2018   Lab Results  Component Value Date   CHOL 142 11/20/2019   HDL 56.60 11/20/2019   LDLCALC 65 11/20/2019   TRIG 105.0 11/20/2019   CHOLHDL 3 11/20/2019   Lab Results  Component Value Date   WBC 6.4 02/06/2020   HGB 11.6 02/06/2020   HCT 36.0 02/06/2020   MCV 87 02/06/2020   PLT 233 02/06/2020   Lab Results  Component Value Date   IRON 38 02/06/2020   TIBC 359 02/06/2020   FERRITIN 26 02/06/2020     Attestation Statements:   Reviewed by clinician on day of visit: allergies, medications, problem list, medical history, surgical history, family history, social history, and previous encounter notes.  Coral Ceo, CMA, am acting as transcriptionist for Coralie Common, MD.   I have reviewed the above documentation for accuracy and completeness, and I agree with the above. - Jinny Blossom, MD

## 2020-09-03 DIAGNOSIS — Z760 Encounter for issue of repeat prescription: Secondary | ICD-10-CM | POA: Diagnosis not present

## 2020-09-05 DIAGNOSIS — M25521 Pain in right elbow: Secondary | ICD-10-CM | POA: Diagnosis not present

## 2020-09-06 ENCOUNTER — Encounter: Payer: 59 | Admitting: Primary Care

## 2020-09-12 ENCOUNTER — Other Ambulatory Visit (HOSPITAL_COMMUNITY): Payer: Self-pay

## 2020-09-12 MED FILL — Atropine Sulfate Ophth Soln 1%: OPHTHALMIC | 90 days supply | Qty: 5 | Fill #0 | Status: AC

## 2020-09-12 MED FILL — Brimonidine Tartrate-Timolol Maleate Ophth Soln 0.2-0.5%: OPHTHALMIC | 90 days supply | Qty: 10 | Fill #0 | Status: CN

## 2020-09-12 MED FILL — Brimonidine Tartrate-Timolol Maleate Ophth Soln 0.2-0.5%: OPHTHALMIC | 90 days supply | Qty: 10 | Fill #0 | Status: AC

## 2020-09-18 ENCOUNTER — Encounter: Payer: 59 | Admitting: Primary Care

## 2020-09-18 ENCOUNTER — Other Ambulatory Visit: Payer: Self-pay | Admitting: Obstetrics and Gynecology

## 2020-09-18 DIAGNOSIS — Z1231 Encounter for screening mammogram for malignant neoplasm of breast: Secondary | ICD-10-CM

## 2020-09-25 ENCOUNTER — Ambulatory Visit (INDEPENDENT_AMBULATORY_CARE_PROVIDER_SITE_OTHER): Payer: 59 | Admitting: Family Medicine

## 2020-09-26 ENCOUNTER — Other Ambulatory Visit (HOSPITAL_COMMUNITY): Payer: Self-pay

## 2020-09-26 ENCOUNTER — Ambulatory Visit (INDEPENDENT_AMBULATORY_CARE_PROVIDER_SITE_OTHER): Payer: 59 | Admitting: Family Medicine

## 2020-09-26 ENCOUNTER — Encounter (INDEPENDENT_AMBULATORY_CARE_PROVIDER_SITE_OTHER): Payer: Self-pay | Admitting: Family Medicine

## 2020-09-26 ENCOUNTER — Other Ambulatory Visit: Payer: Self-pay

## 2020-09-26 VITALS — BP 108/70 | HR 75 | Temp 98.1°F | Ht 68.0 in | Wt 196.0 lb

## 2020-09-26 DIAGNOSIS — J3089 Other allergic rhinitis: Secondary | ICD-10-CM

## 2020-09-26 DIAGNOSIS — Z6833 Body mass index (BMI) 33.0-33.9, adult: Secondary | ICD-10-CM | POA: Diagnosis not present

## 2020-09-26 DIAGNOSIS — E559 Vitamin D deficiency, unspecified: Secondary | ICD-10-CM | POA: Diagnosis not present

## 2020-09-26 DIAGNOSIS — E669 Obesity, unspecified: Secondary | ICD-10-CM

## 2020-09-26 DIAGNOSIS — Z9189 Other specified personal risk factors, not elsewhere classified: Secondary | ICD-10-CM

## 2020-09-26 DIAGNOSIS — M25521 Pain in right elbow: Secondary | ICD-10-CM | POA: Diagnosis not present

## 2020-09-26 MED ORDER — SEMAGLUTIDE-WEIGHT MANAGEMENT 2.4 MG/0.75ML ~~LOC~~ SOAJ
SUBCUTANEOUS | 0 refills | Status: DC
  Filled 2020-09-26: qty 3, 28d supply, fill #0

## 2020-09-26 MED ORDER — VITAMIN D (ERGOCALCIFEROL) 1.25 MG (50000 UNIT) PO CAPS
ORAL_CAPSULE | ORAL | 0 refills | Status: DC
  Filled 2020-09-26: qty 4, 28d supply, fill #0

## 2020-09-27 ENCOUNTER — Ambulatory Visit
Admission: RE | Admit: 2020-09-27 | Discharge: 2020-09-27 | Disposition: A | Discharge status: Home / Self Care | Payer: 59 | Source: Ambulatory Visit | Attending: Obstetrics and Gynecology | Admitting: Obstetrics and Gynecology

## 2020-09-27 ENCOUNTER — Other Ambulatory Visit (HOSPITAL_COMMUNITY): Payer: Self-pay

## 2020-09-27 DIAGNOSIS — Z1231 Encounter for screening mammogram for malignant neoplasm of breast: Secondary | ICD-10-CM | POA: Diagnosis not present

## 2020-10-01 NOTE — Progress Notes (Signed)
Chief Complaint:   OBESITY Angela [name] is here to discuss her progress with her obesity treatment plan along with follow-up of her obesity related diagnoses. Angela [name] is on keeping a food journal and adhering to recommended goals of 1750-1800 calories and 125g protein and states she is following her eating plan approximately 70% of the time. Angela [name] states she is completing physical therapy for her arms and various other exercises for 100 minutes 2-3 times per week.  Today's visit was #: 51 Starting weight: 220 lbs Starting date: 10/05/2018 Today's weight: 196 lbs Today's date: 09/26/2020 Total lbs lost to date: 24 lbs Total lbs lost since last in-office visit: 4 lbs  Interim History: Angela [name] recently went of a mission trip to [place]. She came back exhausted but really enjoyed herself. She was gone for 1 week. At this point, she has been back for 10 days now. She is going to start working in [place] in mid July. She is still taking Wegovy 2.4mg  weekly injection, and not reporting any issues with it at this time. She is still using an app to track her calorie and protein intake.  Subjective:   1. Vitamin D deficiency Angela [name] is still reporting some fatigue, but she is not having any nausea, vomiting, or muscle weakness. She is currently still taking Vitamin D one weekly.  2. Allergic rhinitis due to other allergic trigger, unspecified seasonality Currently taking Zyrtec daily. She is not currently experiencing any side effects of taking this medication.   3. At risk for activity intolerance Angela [name] is at risk of exercise intolerance due to tendinitis.   Assessment/Plan:   1. Vitamin D deficiency Low Vitamin D level contributes to fatigue and are associated with obesity, breast, and colon cancer. She agrees to continue to take prescription Vitamin D @50 ,000 IU every week and will follow-up for routine testing of Vitamin D, at least 2-3 times per year to avoid over-replacement.  -  Refill: Vitamin D, Ergocalciferol, (DRISDOL) 1.25 MG (50000 UNIT) CAPS capsule; TAKE 1 CAPSULE BY MOUTH EVERY 7 DAYS  Dispense: 4 capsule; Refill: 0  2. Allergic rhinitis due to other allergic trigger, unspecified seasonality Continue OTC medications, and no change in therapy is needed at this time.   3. At risk for activity intolerance Angela [name] was given approximately 15 minutes of exercise intolerance counseling today. She is 45 y.o. female and has risk factors exercise intolerance including tendinitis. We discussed intensive lifestyle modifications today with an emphasis on specific weight loss instructions and strategies. Angela [name] will slowly increase activity as tolerated.  Repetitive spaced learning was employed today to elicit superior memory formation and behavioral change.  4. Class 1 obesity with serious comorbidity and body mass index (BMI) of 33.0 to 33.9 in adult, unspecified obesity type Refill Wegovy as per below:   - Refill: Semaglutide-Weight Management 2.4 MG/0.75ML SOAJ; INJECT 2.4 MG INTO THE SKIN ONCE A WEEK.  Dispense: 3 mL; Refill: 0  Angela [name] is currently in the action stage of change. As such, her goal is to continue with weight loss efforts. She has agreed to keeping a food journal and adhering to recommended goals of 1750-1800 calories and 125+g of protein daily.   Exercise goals:  As is.  Behavioral modification strategies: increasing lean protein intake, meal planning and cooking strategies, keeping healthy foods in the home, and planning for success.  Angela [name] has agreed to follow-up with our clinic in 4 weeks. She was informed of the importance of frequent follow-up visits to maximize her  success with intensive lifestyle modifications for her multiple health conditions.   Objective:   Blood pressure 108/70, pulse 75, temperature 98.1 F (36.7 C), height 5\' 8"  (1.727 m), weight 196 lb (88.9 kg), last menstrual period 09/25/2020, SpO2 99 %. Body mass index is 29.8  kg/m.  General: Cooperative, alert, well developed, in no acute distress. HEENT: Conjunctivae and lids unremarkable. Cardiovascular: Regular rhythm.  Lungs: Normal work of breathing. Neurologic: No focal deficits.   Lab Results  Component Value Date   CREATININE 0.73 11/01/2019   BUN 11 11/01/2019   NA 140 11/01/2019   K 5.0 11/01/2019   CL 107 (H) 11/01/2019   CO2 20 11/01/2019   Lab Results  Component Value Date   ALT 24 11/01/2019   AST 22 11/01/2019   ALKPHOS 65 11/01/2019   BILITOT 0.2 11/01/2019   Lab Results  Component Value Date   HGBA1C 5.5 11/01/2019   HGBA1C 5.5 05/25/2019   HGBA1C 5.5 02/02/2019   HGBA1C 5.7 (H) 10/05/2018   HGBA1C 5.9 06/22/2018   Lab Results  Component Value Date   INSULIN 24.4 11/01/2019   INSULIN 24.5 05/25/2019   INSULIN 21.9 02/02/2019   INSULIN 61.1 (H) 10/05/2018   Lab Results  Component Value Date   TSH 2.020 10/05/2018   Lab Results  Component Value Date   CHOL 142 11/20/2019   HDL 56.60 11/20/2019   LDLCALC 65 11/20/2019   TRIG 105.0 11/20/2019   CHOLHDL 3 11/20/2019   Lab Results  Component Value Date   WBC 6.4 02/06/2020   HGB 11.6 02/06/2020   HCT 36.0 02/06/2020   MCV 87 02/06/2020   PLT 233 02/06/2020   Lab Results  Component Value Date   IRON 38 02/06/2020   TIBC 359 02/06/2020   FERRITIN 26 02/06/2020   Attestation Statements:   Reviewed by clinician on day of visit: allergies, medications, problem list, medical history, surgical history, family history, social history, and previous encounter notes.  I, Wyatt Haste, LPN am acting as transcriptionist for Coralie Common, MD.  I have reviewed the above documentation for accuracy and completeness, and I agree with the above. - Jinny Blossom, MD

## 2020-10-04 DIAGNOSIS — M899 Disorder of bone, unspecified: Secondary | ICD-10-CM | POA: Diagnosis not present

## 2020-10-04 DIAGNOSIS — D2262 Melanocytic nevi of left upper limb, including shoulder: Secondary | ICD-10-CM | POA: Diagnosis not present

## 2020-10-04 DIAGNOSIS — L821 Other seborrheic keratosis: Secondary | ICD-10-CM | POA: Diagnosis not present

## 2020-10-10 DIAGNOSIS — M25521 Pain in right elbow: Secondary | ICD-10-CM | POA: Diagnosis not present

## 2020-10-17 ENCOUNTER — Telehealth: Payer: Self-pay

## 2020-10-17 NOTE — Telephone Encounter (Signed)
Pt calling; is having similar sxs she had a couple yrs ago of persistent burning sensation with no d/c or UTI related; after her D&C it ended up being endometritis.  Wants SDJ's input on it, rx, or wait tile appt in a few weeks for annual; sxs started about 2wks ago. [phone]  courtesy call to pt SDJ not in office today but is in tomorrow; pt states okay to wait for SDJ; doesn't know if he wants to go ahead and tx and f/u in a few weeks at annual or what;  fine to wait for annual; says either way is fine.

## 2020-10-24 ENCOUNTER — Other Ambulatory Visit (HOSPITAL_COMMUNITY): Payer: Self-pay

## 2020-10-24 ENCOUNTER — Other Ambulatory Visit: Payer: Self-pay | Admitting: Obstetrics and Gynecology

## 2020-10-24 DIAGNOSIS — N719 Inflammatory disease of uterus, unspecified: Secondary | ICD-10-CM

## 2020-10-24 MED ORDER — DOXYCYCLINE HYCLATE 100 MG PO CAPS
100.0000 mg | ORAL_CAPSULE | Freq: Two times a day (BID) | ORAL | 0 refills | Status: AC
  Filled 2020-10-24: qty 28, 14d supply, fill #0

## 2020-10-28 ENCOUNTER — Other Ambulatory Visit (HOSPITAL_COMMUNITY): Payer: Self-pay

## 2020-10-28 ENCOUNTER — Other Ambulatory Visit: Payer: Self-pay

## 2020-10-28 ENCOUNTER — Encounter (INDEPENDENT_AMBULATORY_CARE_PROVIDER_SITE_OTHER): Payer: Self-pay | Admitting: Family Medicine

## 2020-10-28 ENCOUNTER — Ambulatory Visit (INDEPENDENT_AMBULATORY_CARE_PROVIDER_SITE_OTHER): Payer: 59 | Admitting: Family Medicine

## 2020-10-28 VITALS — BP 95/63 | HR 85 | Temp 98.0°F | Ht 68.0 in | Wt 195.0 lb

## 2020-10-28 DIAGNOSIS — E559 Vitamin D deficiency, unspecified: Secondary | ICD-10-CM | POA: Diagnosis not present

## 2020-10-28 DIAGNOSIS — Z6833 Body mass index (BMI) 33.0-33.9, adult: Secondary | ICD-10-CM | POA: Diagnosis not present

## 2020-10-28 DIAGNOSIS — R7303 Prediabetes: Secondary | ICD-10-CM

## 2020-10-28 DIAGNOSIS — Z9189 Other specified personal risk factors, not elsewhere classified: Secondary | ICD-10-CM | POA: Diagnosis not present

## 2020-10-28 DIAGNOSIS — E669 Obesity, unspecified: Secondary | ICD-10-CM

## 2020-10-28 MED ORDER — VITAMIN D (ERGOCALCIFEROL) 1.25 MG (50000 UNIT) PO CAPS
ORAL_CAPSULE | ORAL | 0 refills | Status: DC
  Filled 2020-10-28: qty 4, 28d supply, fill #0

## 2020-10-28 MED ORDER — SEMAGLUTIDE-WEIGHT MANAGEMENT 2.4 MG/0.75ML ~~LOC~~ SOAJ
SUBCUTANEOUS | 0 refills | Status: DC
  Filled 2020-10-28: qty 3, 28d supply, fill #0

## 2020-10-30 ENCOUNTER — Ambulatory Visit (INDEPENDENT_AMBULATORY_CARE_PROVIDER_SITE_OTHER): Payer: 59 | Admitting: Primary Care

## 2020-10-30 ENCOUNTER — Other Ambulatory Visit: Payer: Self-pay

## 2020-10-30 ENCOUNTER — Encounter: Payer: Self-pay | Admitting: Primary Care

## 2020-10-30 ENCOUNTER — Other Ambulatory Visit (HOSPITAL_COMMUNITY): Payer: Self-pay

## 2020-10-30 VITALS — BP 108/64 | HR 82 | Temp 97.6°F | Ht 68.0 in | Wt 199.0 lb

## 2020-10-30 DIAGNOSIS — Z6830 Body mass index (BMI) 30.0-30.9, adult: Secondary | ICD-10-CM

## 2020-10-30 DIAGNOSIS — H409 Unspecified glaucoma: Secondary | ICD-10-CM | POA: Diagnosis not present

## 2020-10-30 DIAGNOSIS — Z1159 Encounter for screening for other viral diseases: Secondary | ICD-10-CM | POA: Diagnosis not present

## 2020-10-30 DIAGNOSIS — Z800 Family history of malignant neoplasm of digestive organs: Secondary | ICD-10-CM | POA: Diagnosis not present

## 2020-10-30 DIAGNOSIS — Z0000 Encounter for general adult medical examination without abnormal findings: Secondary | ICD-10-CM | POA: Diagnosis not present

## 2020-10-30 DIAGNOSIS — E6609 Other obesity due to excess calories: Secondary | ICD-10-CM

## 2020-10-30 DIAGNOSIS — D509 Iron deficiency anemia, unspecified: Secondary | ICD-10-CM

## 2020-10-30 DIAGNOSIS — E559 Vitamin D deficiency, unspecified: Secondary | ICD-10-CM

## 2020-10-30 DIAGNOSIS — Z114 Encounter for screening for human immunodeficiency virus [HIV]: Secondary | ICD-10-CM

## 2020-10-30 DIAGNOSIS — K5909 Other constipation: Secondary | ICD-10-CM | POA: Diagnosis not present

## 2020-10-30 DIAGNOSIS — M62838 Other muscle spasm: Secondary | ICD-10-CM

## 2020-10-30 DIAGNOSIS — R7303 Prediabetes: Secondary | ICD-10-CM

## 2020-10-30 DIAGNOSIS — E66811 Obesity, class 1: Secondary | ICD-10-CM

## 2020-10-30 LAB — COMPREHENSIVE METABOLIC PANEL
ALT: 17 U/L (ref 0–35)
AST: 17 U/L (ref 0–37)
Albumin: 4.1 g/dL (ref 3.5–5.2)
Alkaline Phosphatase: 53 U/L (ref 39–117)
BUN: 12 mg/dL (ref 6–23)
CO2: 27 mEq/L (ref 19–32)
Calcium: 9.1 mg/dL (ref 8.4–10.5)
Chloride: 105 mEq/L (ref 96–112)
Creatinine, Ser: 0.7 mg/dL (ref 0.40–1.20)
GFR: 104.02 mL/min (ref >60.00)
Glucose, Bld: 92 mg/dL (ref 70–99)
Potassium: 4.1 mEq/L (ref 3.5–5.1)
Sodium: 138 mEq/L (ref 135–145)
Total Bilirubin: 0.5 mg/dL (ref 0.2–1.2)
Total Protein: 6.7 g/dL (ref 6.0–8.3)

## 2020-10-30 LAB — LIPID PANEL
Cholesterol: 144 mg/dL (ref 0–200)
HDL: 55.5 mg/dL (ref >39.00)
LDL Cholesterol: 72 mg/dL (ref 0–99)
NonHDL: 88.44
Total CHOL/HDL Ratio: 3
Triglycerides: 81 mg/dL (ref 0.0–149.0)
VLDL: 16.2 mg/dL (ref 0.0–40.0)

## 2020-10-30 LAB — CBC
HCT: 29.8 % — ABNORMAL LOW (ref 36.0–46.0)
Hemoglobin: 9.9 g/dL — ABNORMAL LOW (ref 12.0–15.0)
MCHC: 33.3 g/dL (ref 30.0–36.0)
MCV: 76.7 fl — ABNORMAL LOW (ref 78.0–100.0)
Platelets: 229 10*3/uL (ref 150.0–400.0)
RBC: 3.89 Mil/uL (ref 3.87–5.11)
RDW: 16.5 % — ABNORMAL HIGH (ref 11.5–15.5)
WBC: 4.7 10*3/uL (ref 4.0–10.5)

## 2020-10-30 LAB — VITAMIN D 25 HYDROXY (VIT D DEFICIENCY, FRACTURES): VITD: 29.02 ng/mL — ABNORMAL LOW (ref 30.00–100.00)

## 2020-10-30 LAB — HEMOGLOBIN A1C: Hgb A1c MFr Bld: 5.6 % (ref 4.6–6.5)

## 2020-10-30 MED ORDER — CYCLOBENZAPRINE HCL 10 MG PO TABS
10.0000 mg | ORAL_TABLET | Freq: Every evening | ORAL | 0 refills | Status: DC | PRN
  Filled 2020-10-30: qty 30, 30d supply, fill #0

## 2020-10-30 NOTE — Assessment & Plan Note (Signed)
Compliant to vitamin D 50,000 units weekly. Continue same. Repeat level pending.

## 2020-10-30 NOTE — Assessment & Plan Note (Signed)
Overall doing well on Linzess 145 mcg daily and PRN metamucil, continue same.

## 2020-10-30 NOTE — Assessment & Plan Note (Signed)
Follows with ophthalmology, continue current regimen.

## 2020-10-30 NOTE — Assessment & Plan Note (Signed)
Chronic, intermittent. Evaluated by ortho previously.  Will refill cyclobenzaprine 10 mg for which she uses sparingly.

## 2020-10-30 NOTE — Assessment & Plan Note (Signed)
Commended her on weight loss, repeat A1c pending.

## 2020-10-30 NOTE — Patient Instructions (Addendum)
Stop by the lab prior to leaving today. I will notify you of your results once received.   It was a pleasure to see you today!  Preventive Care 40-46 Years Old, Female Preventive care refers to lifestyle choices and visits with your health care provider that can promote health and wellness. This includes: A yearly physical exam. This is also called an annual wellness visit. Regular dental and eye exams. Immunizations. Screening for certain conditions. Healthy lifestyle choices, such as: Eating a healthy diet. Getting regular exercise. Not using drugs or products that contain nicotine and tobacco. Limiting alcohol use. What can I expect for my preventive care visit? Physical exam Your health care provider will check your: Height and weight. These may be used to calculate your BMI (body mass index). BMI is a measurement that tells if you are at a healthy weight. Heart rate and blood pressure. Body temperature. Skin for abnormal spots. Counseling Your health care provider may ask you questions about your: Past medical problems. Family's medical history. Alcohol, tobacco, and drug use. Emotional well-being. Home life and relationship well-being. Sexual activity. Diet, exercise, and sleep habits. Work and work environment. Access to firearms. Method of birth control. Menstrual cycle. Pregnancy history. What immunizations do I need?  Vaccines are usually given at various ages, according to a schedule. Your health care provider will recommend vaccines for you based on your age, medicalhistory, and lifestyle or other factors, such as travel or where you work. What tests do I need? Blood tests Lipid and cholesterol levels. These may be checked every 5 years, or more often if you are over 50 years old. Hepatitis C test. Hepatitis B test. Screening Lung cancer screening. You may have this screening every year starting at age 55 if you have a 30-pack-year history of smoking and  currently smoke or have quit within the past 15 years. Colorectal cancer screening. All adults should have this screening starting at age 50 and continuing until age 75. Your health care provider may recommend screening at age 45 if you are at increased risk. You will have tests every 1-10 years, depending on your results and the type of screening test. Diabetes screening. This is done by checking your blood sugar (glucose) after you have not eaten for a while (fasting). You may have this done every 1-3 years. Mammogram. This may be done every 1-2 years. Talk with your health care provider about when you should start having regular mammograms. This may depend on whether you have a family history of breast cancer. BRCA-related cancer screening. This may be done if you have a family history of breast, ovarian, tubal, or peritoneal cancers. Pelvic exam and Pap test. This may be done every 3 years starting at age 21. Starting at age 30, this may be done every 5 years if you have a Pap test in combination with an HPV test. Other tests STD (sexually transmitted disease) testing, if you are at risk. Bone density scan. This is done to screen for osteoporosis. You may have this scan if you are at high risk for osteoporosis. Talk with your health care provider about your test results, treatment options,and if necessary, the need for more tests. Follow these instructions at home: Eating and drinking  Eat a diet that includes fresh fruits and vegetables, whole grains, lean protein, and low-fat dairy products. Take vitamin and mineral supplements as recommended by your health care provider. Do not drink alcohol if: Your health care provider tells you not to drink.   You are pregnant, may be pregnant, or are planning to become pregnant. If you drink alcohol: Limit how much you have to 0-1 drink a day. Be aware of how much alcohol is in your drink. In the [place] one drink equals one 12 oz bottle of beer  (355 mL), one 5 oz glass of wine (148 mL), or one 1 oz glass of hard liquor (44 mL).  Lifestyle Take daily care of your teeth and gums. Brush your teeth every morning and night with fluoride toothpaste. Floss one time each day. Stay active. Exercise for at least 30 minutes 5 or more days each week. Do not use any products that contain nicotine or tobacco, such as cigarettes, e-cigarettes, and chewing tobacco. If you need help quitting, ask your health care provider. Do not use drugs. If you are sexually active, practice safe sex. Use a condom or other form of protection to prevent STIs (sexually transmitted infections). If you do not wish to become pregnant, use a form of birth control. If you plan to become pregnant, see your health care provider for a prepregnancy visit. If told by your health care provider, take low-dose aspirin daily starting at age 50. Find healthy ways to cope with stress, such as: Meditation, yoga, or listening to music. Journaling. Talking to a trusted person. Spending time with friends and family. Safety Always wear your seat belt while driving or riding in a vehicle. Do not drive: If you have been drinking alcohol. Do not ride with someone who has been drinking. When you are tired or distracted. While texting. Wear a helmet and other protective equipment during sports activities. If you have firearms in your house, make sure you follow all gun safety procedures. What's next? Visit your health care provider once a year for an annual wellness visit. Ask your health care provider how often you should have your eyes and teeth checked. Stay up to date on all vaccines. This information is not intended to replace advice given to you by your health care provider. Make sure you discuss any questions you have with your healthcare provider. Document Revised: 01/09/2020 Document Reviewed: 12/16/2017 Elsevier Patient Education  2022 Elsevier Inc.  

## 2020-10-30 NOTE — Progress Notes (Signed)
Subjective:    Patient ID: Angela [name], female    DOB: Jan 24, 1976, 45 y.o.   MRN: [id number]  HPI  Angela [name] is a very pleasant 45 y.o. female who presents today for complete physical.  Immunizations: -Tetanus: 2016 -Influenza: Due this season  -Covid-19: 3 vaccines   Diet: Healthy diet.  Exercise: Regular walking and some resistance exercise.  Eye exam: Completes annually  Dental exam: Completes semi-annually   Pap Smear: Completed in 2020, follows with GYN.  Mammogram: Completed in June 2022 Colonoscopy: Completed in 2020, due in 2023  BP Readings from Last 3 Encounters:  10/30/20 108/64  10/28/20 95/63  09/26/20 108/70   Wt Readings from Last 3 Encounters:  10/30/20 199 lb (90.3 kg)  10/28/20 195 lb (88.5 kg)  09/26/20 196 lb (88.9 kg)       Review of Systems  Constitutional:  Negative for unexpected weight change.  HENT:  Negative for rhinorrhea.   Respiratory:  Negative for cough and shortness of breath.   Cardiovascular:  Negative for chest pain.  Gastrointestinal:  Negative for constipation and diarrhea.  Genitourinary:  Negative for difficulty urinating.  Musculoskeletal:  Positive for myalgias. Negative for arthralgias.  Skin:  Negative for rash.  Allergic/Immunologic: Negative for environmental allergies.  Neurological:  Negative for dizziness, numbness and headaches.  Psychiatric/Behavioral:  The patient is not nervous/anxious.         Past Medical History:  Diagnosis Date   Allergy Enviromental   Anemia 08/2015   Asthma    as a child   Back pain gerd   Constipation    Dysuria 11/25/2018   Glaucoma    Heart murmur childhood   IBS (irritable bowel syndrome)    Joint pain    Menorrhagia    Palpitations    Plantar fasciitis    Prediabetes    Swelling    feet, legs   Torn meniscus    Right   Vaginal burning 11/25/2018    Social History   Socioeconomic History   Marital status: Married    Spouse name:  Financial planner   Number of children: 0   Years of education: Not on file   Highest education level: Not on file  Occupational History   Occupation: Agricultural consultant: [employer]  Tobacco Use   Smoking status: Never   Smokeless tobacco: Never  Vaping Use   Vaping Use: Never used  Substance and Sexual Activity   Alcohol use: Yes    Alcohol/week: 2.0 standard drinks    Types: 2 Glasses of wine per week    Comment: occasional   Drug use: No   Sexual activity: Yes    Birth control/protection: None  Other Topics Concern   Not on file  Social History Narrative   Married.   No children.   Works in the [employer].   Enjoys watching movies, walking, reading.    Social Determinants of Health   Financial Resource Strain: Not on file  Food Insecurity: Not on file  Transportation Needs: Not on file  Physical Activity: Not on file  Stress: Not on file  Social Connections: Not on file  Intimate Partner Violence: Not on file    Past Surgical History:  Procedure Laterality Date   CHOLECYSTECTOMY  2005   COLONOSCOPY WITH PROPOFOL N/A 10/27/2018   Procedure: COLONOSCOPY WITH PROPOFOL;  Surgeon: Lin Landsman, MD;  Location: [facility];  Service: Gastroenterology;  Laterality: N/A;   DILATATION & CURETTAGE/HYSTEROSCOPY WITH  MYOSURE N/A 06/23/2018   Procedure: DILATATION & CURETTAGE/HYSTEROSCOPY WITH MYOSURE;  Surgeon: Will Bonnet, MD;  Location: [facility];  Service: Gynecology;  Laterality: N/A;   DILATION AND CURETTAGE OF UTERUS     ERCP     3 times   ESOPHAGOGASTRODUODENOSCOPY (EGD) WITH PROPOFOL  10/27/2018   Procedure: ESOPHAGOGASTRODUODENOSCOPY (EGD) WITH PROPOFOL;  Surgeon: Lin Landsman, MD;  Location: [facility];  Service: Gastroenterology;;   EYE SURGERY     GIVENS CAPSULE STUDY N/A 12/11/2019   Procedure: GIVENS CAPSULE STUDY;  Surgeon: Lin Landsman, MD;  Location: [facility];  Service: Gastroenterology;  Laterality: N/A;    MENISCUS REPAIR Right    x 2    Family History  Problem Relation Age of Onset   Colon cancer Mother 39   Diabetes Mother    Hypertension Mother    Arthritis Mother    Transient ischemic attack Mother    Liver disease Mother    Obesity Mother    Diabetes Father    Heart disease Father    Breast cancer Maternal Aunt        mat great aunt   Esophageal cancer Maternal Grandmother    Cancer Maternal Grandmother    Diabetes Maternal Grandmother    Alcohol abuse Maternal Grandfather     Allergies  Allergen Reactions   Quinoa-Kale-Hemp [Alimentum]     Current Outpatient Medications on File Prior to Visit  Medication Sig Dispense Refill   atropine 1 % ophthalmic solution PLACE 1 DROP INTO THE RIGHT EYE DAILY. 5 mL 11   brimonidine-timolol (COMBIGAN) 0.2-0.5 % ophthalmic solution PLACE 1 DROP INTO THE RIGHT EYE EVERY 12 HOURS. 10 mL 11   cetirizine (ZYRTEC) 10 MG tablet Take 10 mg by mouth daily as needed for allergies.     doxycycline (VIBRAMYCIN) 100 MG capsule Take 1 capsule (100 mg total) by mouth 2 (two) times daily for 14 days. 28 capsule 0   ibuprofen (ADVIL,MOTRIN) 600 MG tablet Take 1 tablet (600 mg total) by mouth every 6 (six) hours as needed for mild pain, moderate pain or cramping. 30 tablet 0   Iron-FA-B Cmp-C-Biot-Probiotic (FUSION PLUS) CAPS TAKE 1 CAPSULE BY MOUTH DAILY. 30 capsule 2   linaclotide (LINZESS) 145 MCG CAPS capsule TAKE 1 CAPSULE BY MOUTH ONCE DAILY BEFORE BREAKFAST 90 capsule 0   psyllium (METAMUCIL) 58.6 % packet Take 1 packet by mouth daily.     Semaglutide-Weight Management 2.4 MG/0.75ML SOAJ INJECT 2.4 MG INTO THE SKIN ONCE A WEEK. 3 mL 0   Vitamin D, Ergocalciferol, (DRISDOL) 1.25 MG (50000 UNIT) CAPS capsule TAKE 1 CAPSULE BY MOUTH EVERY 7 DAYS 4 capsule 0   No current facility-administered medications on file prior to visit.    BP 108/64   Pulse 82   Temp 97.6 F (36.4 C) (Temporal)   Ht 5\' 8"  (1.727 m)   Wt 199 lb (90.3 kg)   SpO2 97%    BMI 30.26 kg/m  Objective:   Physical Exam HENT:     Right Ear: Tympanic membrane and ear canal normal.     Left Ear: Tympanic membrane and ear canal normal.     Nose: Nose normal.  Eyes:     Conjunctiva/sclera: Conjunctivae normal.     Pupils: Pupils are equal, round, and reactive to light.  Neck:     Thyroid: No thyromegaly.  Cardiovascular:     Rate and Rhythm: Normal rate and regular rhythm.     Heart sounds: No murmur  heard. Pulmonary:     Effort: Pulmonary effort is normal.     Breath sounds: Normal breath sounds. No rales.  Abdominal:     General: Bowel sounds are normal.     Palpations: Abdomen is soft.     Tenderness: There is no abdominal tenderness.  Musculoskeletal:        General: Normal range of motion.     Cervical back: Neck supple.  Lymphadenopathy:     Cervical: No cervical adenopathy.  Skin:    General: Skin is warm and dry.     Findings: No rash.  Neurological:     Mental Status: She is alert and oriented to person, place, and time.     Cranial Nerves: No cranial nerve deficit.     Deep Tendon Reflexes: Reflexes are normal and symmetric.  Psychiatric:        Mood and Affect: Mood normal.          Assessment & Plan:      This visit occurred during the SARS-CoV-2 public health emergency.  Safety protocols were in place, including screening questions prior to the visit, additional usage of staff PPE, and extensive cleaning of exam room while observing appropriate contact time as indicated for disinfecting solutions.

## 2020-10-30 NOTE — Assessment & Plan Note (Signed)
Colonoscopy UTD, due in 2023

## 2020-10-30 NOTE — Assessment & Plan Note (Signed)
Repeat CBC pending. Following with GYN

## 2020-10-30 NOTE — Assessment & Plan Note (Signed)
Immunizations UTD. Pap smear UTD, follows with GYN.

## 2020-10-30 NOTE — Assessment & Plan Note (Signed)
Following with healthy weight and wellness. Repeat labs pending today per their request.   Now on Wegovy and doing well. Commended her on weight loss efforts!

## 2020-10-30 NOTE — Progress Notes (Signed)
Chief Complaint:   OBESITY Angela [name] is here to discuss her progress with her obesity treatment plan along with follow-up of her obesity related diagnoses. Angela [name] is on keeping a food journal and adhering to recommended goals of 1700-1800 calories and 125 g protein and states she is following her eating plan approximately 80% of the time. Angela [name] states she is walking and resistance training 20 minutes 3 times per week.  Today's visit was #: 24 Starting weight: 220 lbs Starting date: 10/05/2018 Today's weight: 195 lbs Today's date: 10/28/2020 Total lbs lost to date: 25 Total lbs lost since last in-office visit: 1  Interim History: Angela [name] is starting a new job at [employer] next week. She had a few indulgences last week with her brother in town for her birthday. She went to a [employer], [employer], and a bakery. Ending her day with ~1750-1800 cal/day. Protein for the day is slightly lower than 125 grams but pt is mindful of that.   Subjective:   1. Vitamin D deficiency Pt denies nausea, vomiting, and muscle weakness but notes fatigue. Pt is on prescription Vit D.  2. Pre-diabetes Angela [name]'s last A1c was 5.5. She is on GLP-1.  3. At risk for osteoporosis Angela [name] is at higher risk of osteopenia and osteoporosis due to Vitamin D deficiency.   Assessment/Plan:   1. Vitamin D deficiency Low Vitamin D level contributes to fatigue and are associated with obesity, breast, and colon cancer. She agrees to continue to take prescription Vitamin D @50 ,000 IU every week and will follow-up for routine testing of Vitamin D, at least 2-3 times per year to avoid over-replacement.  Refill- Vitamin D, Ergocalciferol, (DRISDOL) 1.25 MG (50000 UNIT) CAPS capsule; TAKE 1 CAPSULE BY MOUTH EVERY 7 DAYS  Dispense: 4 capsule; Refill: 0  2. Pre-diabetes Milta will continue to work on weight loss, exercise, and decreasing simple carbohydrates to help decrease the risk of diabetes.   Labs in 2 days at PCP.  3. At risk for osteoporosis Angela [name] was given approximately 15 minutes of osteoporosis prevention counseling today. Angela [name] is at risk for osteopenia and osteoporosis due to her Vitamin D deficiency. She was encouraged to take her Vitamin D and follow her higher calcium diet and increase strengthening exercise to help strengthen her bones and decrease her risk of osteopenia and osteoporosis.  Repetitive spaced learning was employed today to elicit superior memory formation and behavioral change.  4. Class 1 obesity with serious comorbidity and body mass index (BMI) of 33.0 to 33.9 in adult, unspecified obesity type  Angela [name] is currently in the action stage of change. As such, her goal is to continue with weight loss efforts. She has agreed to keeping a food journal and adhering to recommended goals of 1750-1800 calories and 125 g protein.   Refill- Semaglutide-Weight Management 2.4 MG/0.75ML SOAJ; INJECT 2.4 MG INTO THE SKIN ONCE A WEEK.  Dispense: 3 mL; Refill: 0  Exercise goals:  As is  Behavioral modification strategies: increasing lean protein intake, meal planning and cooking strategies, and keeping healthy foods in the home.  Angela [name] has agreed to follow-up with our clinic in 4 weeks. She was informed of the importance of frequent follow-up visits to maximize her success with intensive lifestyle modifications for her multiple health conditions.   Objective:   Blood pressure 95/63, pulse 85, temperature 98 F (36.7 C), height 5\' 8"  (1.727 m), weight 195 lb (88.5 kg), SpO2 99 %. Body mass index is 29.65 kg/m.  General:  Cooperative, alert, well developed, in no acute distress. HEENT: Conjunctivae and lids unremarkable. Cardiovascular: Regular rhythm.  Lungs: Normal work of breathing. Neurologic: No focal deficits.   Lab Results  Component Value Date   CREATININE 0.70 10/30/2020   BUN 12 10/30/2020   NA 138 10/30/2020   K 4.1 10/30/2020   CL 105  10/30/2020   CO2 27 10/30/2020   Lab Results  Component Value Date   ALT 17 10/30/2020   AST 17 10/30/2020   ALKPHOS 53 10/30/2020   BILITOT 0.5 10/30/2020   Lab Results  Component Value Date   HGBA1C 5.6 10/30/2020   HGBA1C 5.5 11/01/2019   HGBA1C 5.5 05/25/2019   HGBA1C 5.5 02/02/2019   HGBA1C 5.7 (H) 10/05/2018   Lab Results  Component Value Date   INSULIN 24.4 11/01/2019   INSULIN 24.5 05/25/2019   INSULIN 21.9 02/02/2019   INSULIN 61.1 (H) 10/05/2018   Lab Results  Component Value Date   TSH 2.020 10/05/2018   Lab Results  Component Value Date   CHOL 144 10/30/2020   HDL 55.50 10/30/2020   LDLCALC 72 10/30/2020   TRIG 81.0 10/30/2020   CHOLHDL 3 10/30/2020   Lab Results  Component Value Date   VD25OH 29.02 (L) 10/30/2020   VD25OH 39.9 11/01/2019   VD25OH 35.6 05/25/2019   Lab Results  Component Value Date   WBC 4.7 10/30/2020   HGB 9.9 (L) 10/30/2020   HCT 29.8 (L) 10/30/2020   MCV 76.7 (L) 10/30/2020   PLT 229.0 10/30/2020   Lab Results  Component Value Date   IRON 38 02/06/2020   TIBC 359 02/06/2020   FERRITIN 26 02/06/2020   Attestation Statements:   Reviewed by clinician on day of visit: allergies, medications, problem list, medical history, surgical history, family history, social history, and previous encounter notes.  Coral Ceo, CMA, am acting as transcriptionist for Coralie Common, MD.   I have reviewed the above documentation for accuracy and completeness, and I agree with the above. - Coralie Common, MD

## 2020-10-31 ENCOUNTER — Encounter: Payer: Self-pay | Admitting: Obstetrics and Gynecology

## 2020-10-31 ENCOUNTER — Ambulatory Visit (INDEPENDENT_AMBULATORY_CARE_PROVIDER_SITE_OTHER): Payer: 59 | Admitting: Obstetrics and Gynecology

## 2020-10-31 ENCOUNTER — Other Ambulatory Visit (HOSPITAL_COMMUNITY): Payer: Self-pay

## 2020-10-31 VITALS — BP 135/79 | Ht 68.0 in | Wt 203.0 lb

## 2020-10-31 DIAGNOSIS — Z01419 Encounter for gynecological examination (general) (routine) without abnormal findings: Secondary | ICD-10-CM | POA: Diagnosis not present

## 2020-10-31 DIAGNOSIS — Z1339 Encounter for screening examination for other mental health and behavioral disorders: Secondary | ICD-10-CM

## 2020-10-31 DIAGNOSIS — D500 Iron deficiency anemia secondary to blood loss (chronic): Secondary | ICD-10-CM

## 2020-10-31 DIAGNOSIS — Z1331 Encounter for screening for depression: Secondary | ICD-10-CM

## 2020-10-31 DIAGNOSIS — N920 Excessive and frequent menstruation with regular cycle: Secondary | ICD-10-CM

## 2020-10-31 MED ORDER — LEVONORGEST-ETH ESTRAD 91-DAY 0.15-0.03 &0.01 MG PO TABS
1.0000 | ORAL_TABLET | Freq: Every day | ORAL | 4 refills | Status: DC
  Filled 2020-10-31: qty 91, 91d supply, fill #0

## 2020-10-31 NOTE — Progress Notes (Signed)
hGynecology Annual Exam  PCP: Pleas Koch, NP  Chief Complaint  Patient presents with   Annual Exam   History of Present Illness:  Angela [name] is a 45 y.o. G0P0000 who LMP was 10/21/2020, presents today for her annual examination.  Her menses are regular every 28-30 days, lasting 7 day(s).  Dysmenorrhea none. She does not have intermenstrual bleeding.  The bleeding is still heavy, she does have some clots, but less.  She has had to take iron to help with her drop in hemoglobin.  She has to use overnight pads during the day for the first 4 days of the menses.    She had a recent CBC that showed a hemoglobin 9.9/hematocrit 29.8.    She is sexually active. She uses nothing for contraception.   Last Pap: 04/2018  Results were: no abnormalities /neg HPV DNA.  Hx of STDs: none  Last mammogram: 09/2020  Results were: normal--routine follow-up in 12 months There is a FH of breast cancer in her maternal great aunt. There is no FH of ovarian cancer. The patient does do self-breast exams.  Colonoscopy: Two years ago due to family history of colon cancer.  She also had an upper endoscopy.  She had multiple precancerous polyps on colonoscopy. So, follow up is next year.  DEXA: has not been screened for osteoporosis  Tobacco use: The patient denies current or previous tobacco use. Alcohol use: social drinker Exercise: several times per week.  The patient wears seatbelts: yes.     Past Medical History:  Diagnosis Date   Allergy Enviromental   Anemia 08/2015   Asthma    as a child   Back pain gerd   Constipation    Dysuria 11/25/2018   Glaucoma    Heart murmur childhood   IBS (irritable bowel syndrome)    Joint pain    Menorrhagia    Palpitations    Plantar fasciitis    Prediabetes    Swelling    feet, legs   Torn meniscus    Right   Vaginal burning 11/25/2018    Past Surgical History:  Procedure Laterality Date   CHOLECYSTECTOMY  2005   COLONOSCOPY WITH  PROPOFOL N/A 10/27/2018   Procedure: COLONOSCOPY WITH PROPOFOL;  Surgeon: Lin Landsman, MD;  Location: [facility] ENDOSCOPY;  Service: Gastroenterology;  Laterality: N/A;   DILATATION & CURETTAGE/HYSTEROSCOPY WITH MYOSURE N/A 06/23/2018   Procedure: DILATATION & CURETTAGE/HYSTEROSCOPY WITH MYOSURE;  Surgeon: Will Bonnet, MD;  Location: [facility] ORS;  Service: Gynecology;  Laterality: N/A;   DILATION AND CURETTAGE OF UTERUS     ERCP     3 times   ESOPHAGOGASTRODUODENOSCOPY (EGD) WITH PROPOFOL  10/27/2018   Procedure: ESOPHAGOGASTRODUODENOSCOPY (EGD) WITH PROPOFOL;  Surgeon: Lin Landsman, MD;  Location: [facility] ENDOSCOPY;  Service: Gastroenterology;;   EYE SURGERY     GIVENS CAPSULE STUDY N/A 12/11/2019   Procedure: GIVENS CAPSULE STUDY;  Surgeon: Lin Landsman, MD;  Location: [facility] ENDOSCOPY;  Service: Gastroenterology;  Laterality: N/A;   MENISCUS REPAIR Right    x 2    Prior to Admission medications   Medication Sig Start Date End Date Taking? Authorizing Provider  atropine 1 % ophthalmic solution Place 1 drop into the right eye daily.   Yes [provider]  brimonidine-timolol (COMBIGAN) 0.2-0.5 % ophthalmic solution Place 1 drop into the right eye every 12 (twelve) hours.    Yes [provider]  cetirizine (ZYRTEC) 10 MG tablet Take 10 mg by mouth  daily as needed for allergies.   Yes [provider]  ibuprofen (ADVIL,MOTRIN) 600 MG tablet Take 1 tablet (600 mg total) by mouth every 6 (six) hours as needed for mild pain, moderate pain or cramping. 06/23/18  Yes Will Bonnet, MD  Iron-FA-B Cmp-C-Biot-Probiotic (FUSION PLUS) CAPS  04/27/19  Yes [provider]  linaclotide (LINZESS) 145 MCG CAPS capsule TAKE 1 CAPSULE BY MOUTH ONCE DAILY BEFORE BREAKFAST for constipation. 08/23/19  Yes Pleas Koch, NP  metFORMIN (GLUCOPHAGE) 500 MG tablet Take 1 tablet (500 mg total) by mouth 2 (two) times daily with a meal. 09/07/19 10/07/19 Yes Eber Jones, MD  Vitamin D, Ergocalciferol, (DRISDOL) 1.25 MG (50000 UNIT) CAPS capsule TAKE 1 CAPSULE BY MOUTH EVERY 7 DAYS 09/07/19  Yes Eber Jones, MD  psyllium (METAMUCIL) 58.6 % packet Take 1 packet by mouth daily.    [provider]    Allergies  Allergen Reactions   Quinoa-Kale-Hemp [Alimentum]     Obstetric History: G0P0000  Family History  Problem Relation Age of Onset   Colon cancer Mother 12   Diabetes Mother    Hypertension Mother    Arthritis Mother    Transient ischemic attack Mother    Liver disease Mother    Obesity Mother    Diabetes Father    Heart disease Father    Breast cancer Maternal Aunt        mat great aunt   Esophageal cancer Maternal Grandmother    Cancer Maternal Grandmother    Diabetes Maternal Grandmother    Alcohol abuse Maternal Grandfather     Social History   Socioeconomic History   Marital status: Married    Spouse name: Financial planner   Number of children: 0   Years of education: Not on file   Highest education level: Not on file  Occupational History   Occupation: Agricultural consultant: [employer]  Tobacco Use   Smoking status: Never   Smokeless tobacco: Never  Vaping Use   Vaping Use: Never used  Substance and Sexual Activity   Alcohol use: Yes    Alcohol/week: 2.0 standard drinks    Types: 2 Glasses of wine per week    Comment: occasional   Drug use: No   Sexual activity: Yes    Birth control/protection: None  Other Topics Concern   Not on file  Social History Narrative   Married.   No children.   Works in the [employer].   Enjoys watching movies, walking, reading.    Social Determinants of Health   Financial Resource Strain: Not on file  Food Insecurity: Not on file  Transportation Needs: Not on file  Physical Activity: Not on file  Stress: Not on file  Social Connections: Not on file  Intimate Partner Violence: Not on file    Review of Systems  Constitutional:  Negative.   HENT: Negative.    Eyes: Negative.   Respiratory: Negative.    Cardiovascular: Negative.   Gastrointestinal: Negative.   Genitourinary: Negative.   Musculoskeletal: Negative.   Skin: Negative.   Neurological: Negative.   Psychiatric/Behavioral: Negative.      Physical Exam BP 135/79   Ht 5\' 8"  (1.727 m)   Wt 203 lb (92.1 kg)   BMI 30.87 kg/m   Physical Exam Constitutional:      General: She is not in acute distress.    Appearance: Normal appearance. She is well-developed.  Genitourinary:     Vulva  and bladder normal.     Right Labia: No rash, tenderness, lesions, skin changes or Bartholin's cyst.    Left Labia: No tenderness, lesions, skin changes, Bartholin's cyst or rash.    No inguinal adenopathy present in the right or left side.    Pelvic Tanner Score: 5/5.    No vaginal discharge, erythema, tenderness or bleeding.      Right Adnexa: not tender, not full and no mass present.    Left Adnexa: not tender, not full and no mass present.    No cervical motion tenderness, discharge, lesion or polyp.     Uterus is not enlarged or tender.     No uterine mass detected.    Pelvic exam was performed with patient in the lithotomy position.  Breasts:    Right: No swelling, bleeding, inverted nipple, mass, nipple discharge, skin change or tenderness.     Left: No swelling, bleeding, inverted nipple, mass, nipple discharge, skin change or tenderness.  HENT:     Head: Normocephalic and atraumatic.  Eyes:     General: No scleral icterus.    Conjunctiva/sclera: Conjunctivae normal.  Neck:     Thyroid: No thyromegaly.  Cardiovascular:     Rate and Rhythm: Normal rate and regular rhythm.     Heart sounds: No murmur heard.   No friction rub. No gallop.  Pulmonary:     Effort: Pulmonary effort is normal. No respiratory distress.     Breath sounds: Normal breath sounds. No wheezing or rales.  Abdominal:     General: Bowel sounds are normal. There is no distension.      Palpations: Abdomen is soft. There is no mass.     Tenderness: There is no abdominal tenderness. There is no guarding or rebound.     Hernia: There is no hernia in the left inguinal area or right inguinal area.  Musculoskeletal:        General: No swelling or tenderness. Normal range of motion.     Cervical back: Normal range of motion and neck supple.  Lymphadenopathy:     Cervical: No cervical adenopathy.     Lower Body: No right inguinal and no right inguinal adenopathy. No left inguinal and no left inguinal adenopathy.  Neurological:     General: No focal deficit present.     Mental Status: She is alert and oriented to person, place, and time.     Cranial Nerves: No cranial nerve deficit.  Skin:    General: Skin is warm and dry.     Findings: No erythema or rash.  Psychiatric:        Mood and Affect: Mood normal.        Behavior: Behavior normal.        Judgment: Judgment normal.    Female chaperone present for pelvic and breast  portions of the physical exam  Results: AUDIT Questionnaire (screen for alcoholism): 2 PHQ-9: 2  Assessment: 45 y.o. G0P0000 female here for routine gynecologic examination.  Plan: Problem List Items Addressed This Visit       Other   Menorrhagia with regular cycle   Relevant Medications   Levonorgestrel-Ethinyl Estradiol (AMETHIA) 0.15-0.03 &0.01 MG tablet   Other Visit Diagnoses     Women's annual routine gynecological examination    -  Primary   Relevant Medications   Levonorgestrel-Ethinyl Estradiol (AMETHIA) 0.15-0.03 &0.01 MG tablet   Screening for depression       Screening for alcoholism       Anemia  due to chronic blood loss       Relevant Medications   Levonorgestrel-Ethinyl Estradiol (AMETHIA) 0.15-0.03 &0.01 MG tablet      Screening: -- Blood pressure screen normal -- Colonoscopy - not due -- Mammogram - due - already scheduled at [facility] on 09/27/2020 -- Weight screening: overweight: continue to monitor -- Depression  screening negative (PHQ-9) -- Nutrition: normal -- cholesterol screening: per PCP -- osteoporosis screening: not due -- tobacco screening: not using -- alcohol screening: AUDIT questionnaire indicates low-risk usage. -- family history of breast cancer screening: done. not at high risk. -- no evidence of domestic violence or intimate partner violence. -- STD screening: gonorrhea/chlamydia NAAT not collected per patient request. -- pap smear not collected per ASCCP guidelines  Menorrhagia: start medication to better control her cycle. Monitor BPs at home.  If elevated, may consider switching to progesterone-only vs surgery.   Prentice Docker, MD 09/27/2019 1:46 PM

## 2020-11-03 LAB — HIV ANTIBODY (ROUTINE TESTING W REFLEX): HIV 1&2 Ab, 4th Generation: NONREACTIVE

## 2020-11-03 LAB — HEPATITIS C ANTIBODY
Hepatitis C Ab: NONREACTIVE
SIGNAL TO CUT-OFF: 0.01 (ref <1.00)

## 2020-11-03 LAB — INSULIN, RANDOM: Insulin: 16 u[IU]/mL

## 2020-11-03 LAB — LIPOPROTEIN A (LPA): Lipoprotein (a): <10 nmol/L (ref <75)

## 2020-11-18 ENCOUNTER — Other Ambulatory Visit (HOSPITAL_COMMUNITY): Payer: Self-pay

## 2020-11-18 ENCOUNTER — Other Ambulatory Visit: Payer: Self-pay | Admitting: Gastroenterology

## 2020-11-18 DIAGNOSIS — K5909 Other constipation: Secondary | ICD-10-CM

## 2020-11-19 ENCOUNTER — Other Ambulatory Visit (HOSPITAL_COMMUNITY): Payer: Self-pay

## 2020-11-19 MED ORDER — LINACLOTIDE 145 MCG PO CAPS
ORAL_CAPSULE | ORAL | 0 refills | Status: DC
  Filled 2020-11-19: qty 90, 90d supply, fill #0

## 2020-11-20 ENCOUNTER — Encounter (INDEPENDENT_AMBULATORY_CARE_PROVIDER_SITE_OTHER): Payer: Self-pay | Admitting: Family Medicine

## 2020-11-20 ENCOUNTER — Other Ambulatory Visit: Payer: Self-pay

## 2020-11-20 ENCOUNTER — Other Ambulatory Visit (HOSPITAL_COMMUNITY): Payer: Self-pay

## 2020-11-20 ENCOUNTER — Ambulatory Visit (INDEPENDENT_AMBULATORY_CARE_PROVIDER_SITE_OTHER): Payer: 59 | Admitting: Family Medicine

## 2020-11-20 VITALS — BP 108/72 | HR 81 | Temp 98.1°F | Ht 68.0 in | Wt 194.0 lb

## 2020-11-20 DIAGNOSIS — E559 Vitamin D deficiency, unspecified: Secondary | ICD-10-CM | POA: Diagnosis not present

## 2020-11-20 DIAGNOSIS — Z6833 Body mass index (BMI) 33.0-33.9, adult: Secondary | ICD-10-CM | POA: Diagnosis not present

## 2020-11-20 DIAGNOSIS — Z9189 Other specified personal risk factors, not elsewhere classified: Secondary | ICD-10-CM | POA: Diagnosis not present

## 2020-11-20 DIAGNOSIS — E669 Obesity, unspecified: Secondary | ICD-10-CM | POA: Diagnosis not present

## 2020-11-20 DIAGNOSIS — R7303 Prediabetes: Secondary | ICD-10-CM | POA: Diagnosis not present

## 2020-11-20 MED ORDER — SEMAGLUTIDE-WEIGHT MANAGEMENT 2.4 MG/0.75ML ~~LOC~~ SOAJ
SUBCUTANEOUS | 0 refills | Status: DC
  Filled 2020-11-20: qty 3, 28d supply, fill #0

## 2020-11-20 MED ORDER — VITAMIN D3 1.25 MG (50000 UT) PO CAPS
50000.0000 [IU] | ORAL_CAPSULE | ORAL | 0 refills | Status: DC
  Filled 2020-11-20: qty 4, 28d supply, fill #0

## 2020-11-21 NOTE — Progress Notes (Signed)
Chief Complaint:   OBESITY Angela [name] is here to discuss her progress with her obesity treatment plan along with follow-up of her obesity related diagnoses. Angela [name] is on keeping a food journal and adhering to recommended goals of 1750-1800 calories and 125 protein and states she is following her eating plan approximately 80% of the time. Angela [name] states she is walking and doing yard work 15 minutes 3-4 times per week.  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 She has significantly decreased her commute and has slightly increased stress. She is sticking between 1700-1800 calories and at least 125 grams protein for the day.  Subjective:   1. Vitamin D deficiency Angela [name] denies nausea, vomiting, and muscle weakness but notes fatigue. Pt is on prescription Vit D3 with no improvement in Vit D level.  2. Pre-diabetes Angela [name]'s last A1c was 5.6 and insulin level 16.0. She is on GLP-1.  3. At risk for osteoporosis Angela [name] is at higher risk of osteopenia and osteoporosis due to Vitamin D deficiency.   Assessment/Plan:   1. Vitamin D deficiency Low Vitamin D level contributes to fatigue and are associated with obesity, breast, and colon cancer. She agrees to continue to take prescription Vitamin D '@50'$ ,000 IU every week and will follow-up for routine testing of Vitamin D, at least 2-3 times per year to avoid over-replacement.  Refill- Cholecalciferol (VITAMIN D3) 1.25 MG (50000 UT) CAPS; Take 1 capsule (50,000 units) by mouth once a week.  Dispense: 4 capsule; Refill: 0  2. Pre-diabetes Angela [name] will continue to work on weight loss, exercise, and decreasing simple carbohydrates to help decrease the risk of diabetes. Repeat labs in 3 months.  3. At risk  for osteoporosis Angela [name] was given approximately 15 minutes of osteoporosis prevention counseling today. Angela [name] is at risk for osteopenia and osteoporosis due to her Vitamin D deficiency. She was encouraged to take her Vitamin D and follow her higher calcium diet and increase strengthening exercise to help strengthen her bones and decrease her risk of osteopenia and osteoporosis.  Repetitive spaced learning was employed today to elicit superior memory formation and behavioral change.  4. Obesity with current BMI of 29.6  Angela [name] is currently in the action stage of change. As such, her goal is to continue with weight loss efforts. She has agreed to keeping a food journal and adhering to recommended goals of 1700-1800 calories and 125+ grams protein.   Refill- Semaglutide-Weight Management 2.4 MG/0.75ML SOAJ; INJECT 2.4 MG INTO THE SKIN ONCE A WEEK.  Dispense: 3 mL; Refill: 0  Exercise goals:  As is  Behavioral modification strategies: increasing lean protein intake, meal planning and cooking strategies, and keeping a strict food journal.  Angela [name] has agreed to follow-up with our clinic in 4 weeks. She was informed of the importance of frequent follow-up visits to maximize her success with intensive lifestyle modifications for her multiple health conditions.   Objective:   Blood pressure 108/72, pulse 81, temperature 98.1 F (36.7 C), height '5\' 8"'$  (1.727 m), weight 194 lb (88 kg), last menstrual period 10/21/2020, SpO2 99 %. Body mass index is 29.5 kg/m.  General: Cooperative, alert, well developed, in no acute distress. HEENT: Conjunctivae and lids unremarkable. Cardiovascular: Regular rhythm.  Lungs: Normal work of breathing. Neurologic: No  focal deficits.   Lab Results  Component Value Date   CREATININE 0.70 10/30/2020   BUN 12 10/30/2020   NA 138 10/30/2020   K 4.1 10/30/2020   CL 105 10/30/2020   CO2 27 10/30/2020   Lab Results  Component Value Date   ALT 17 10/30/2020   AST  17 10/30/2020   ALKPHOS 53 10/30/2020   BILITOT 0.5 10/30/2020   Lab Results  Component Value Date   HGBA1C 5.6 10/30/2020   HGBA1C 5.5 11/01/2019   HGBA1C 5.5 05/25/2019   HGBA1C 5.5 02/02/2019   HGBA1C 5.7 (H) 10/05/2018   Lab Results  Component Value Date   INSULIN 24.4 11/01/2019   INSULIN 24.5 05/25/2019   INSULIN 21.9 02/02/2019   INSULIN 61.1 (H) 10/05/2018   Lab Results  Component Value Date   TSH 2.020 10/05/2018   Lab Results  Component Value Date   CHOL 144 10/30/2020   HDL 55.50 10/30/2020   LDLCALC 72 10/30/2020   TRIG 81.0 10/30/2020   CHOLHDL 3 10/30/2020   Lab Results  Component Value Date   VD25OH 29.02 (L) 10/30/2020   VD25OH 39.9 11/01/2019   VD25OH 35.6 05/25/2019   Lab Results  Component Value Date   WBC 4.7 10/30/2020   HGB 9.9 (L) 10/30/2020   HCT 29.8 (L) 10/30/2020   MCV 76.7 (L) 10/30/2020   PLT 229.0 10/30/2020   Lab Results  Component Value Date   IRON 38 02/06/2020   TIBC 359 02/06/2020   FERRITIN 26 02/06/2020    Attestation Statements:   Reviewed by clinician on day of visit: allergies, medications, problem list, medical history, surgical history, family history, social history, and previous encounter notes.  Coral Ceo, CMA, am acting as transcriptionist for Coralie Common, MD.   I have reviewed the above documentation for accuracy and completeness, and I agree with the above. - Coralie Common, MD

## 2020-11-25 ENCOUNTER — Ambulatory Visit (INDEPENDENT_AMBULATORY_CARE_PROVIDER_SITE_OTHER): Payer: 59 | Admitting: Family Medicine

## 2020-12-07 DIAGNOSIS — H5212 Myopia, left eye: Secondary | ICD-10-CM | POA: Diagnosis not present

## 2020-12-07 DIAGNOSIS — E119 Type 2 diabetes mellitus without complications: Secondary | ICD-10-CM | POA: Diagnosis not present

## 2020-12-17 ENCOUNTER — Other Ambulatory Visit (HOSPITAL_COMMUNITY): Payer: Self-pay

## 2020-12-17 MED FILL — Atropine Sulfate Ophth Soln 1%: OPHTHALMIC | 90 days supply | Qty: 5 | Fill #1 | Status: AC

## 2020-12-17 MED FILL — Brimonidine Tartrate-Timolol Maleate Ophth Soln 0.2-0.5%: OPHTHALMIC | 90 days supply | Qty: 10 | Fill #1 | Status: AC

## 2020-12-19 DIAGNOSIS — M9901 Segmental and somatic dysfunction of cervical region: Secondary | ICD-10-CM | POA: Diagnosis not present

## 2020-12-19 DIAGNOSIS — M9907 Segmental and somatic dysfunction of upper extremity: Secondary | ICD-10-CM | POA: Diagnosis not present

## 2020-12-19 DIAGNOSIS — M9903 Segmental and somatic dysfunction of lumbar region: Secondary | ICD-10-CM | POA: Diagnosis not present

## 2020-12-19 DIAGNOSIS — M9902 Segmental and somatic dysfunction of thoracic region: Secondary | ICD-10-CM | POA: Diagnosis not present

## 2020-12-24 ENCOUNTER — Other Ambulatory Visit: Payer: Self-pay

## 2020-12-24 ENCOUNTER — Ambulatory Visit (INDEPENDENT_AMBULATORY_CARE_PROVIDER_SITE_OTHER): Payer: 59 | Admitting: Family Medicine

## 2020-12-24 ENCOUNTER — Other Ambulatory Visit (HOSPITAL_COMMUNITY): Payer: Self-pay

## 2020-12-24 ENCOUNTER — Encounter (INDEPENDENT_AMBULATORY_CARE_PROVIDER_SITE_OTHER): Payer: Self-pay | Admitting: Family Medicine

## 2020-12-24 VITALS — BP 113/76 | HR 97 | Temp 98.2°F | Ht 68.0 in | Wt 191.0 lb

## 2020-12-24 DIAGNOSIS — Z6833 Body mass index (BMI) 33.0-33.9, adult: Secondary | ICD-10-CM

## 2020-12-24 DIAGNOSIS — M9902 Segmental and somatic dysfunction of thoracic region: Secondary | ICD-10-CM | POA: Diagnosis not present

## 2020-12-24 DIAGNOSIS — E669 Obesity, unspecified: Secondary | ICD-10-CM | POA: Diagnosis not present

## 2020-12-24 DIAGNOSIS — M778 Other enthesopathies, not elsewhere classified: Secondary | ICD-10-CM | POA: Diagnosis not present

## 2020-12-24 DIAGNOSIS — M9907 Segmental and somatic dysfunction of upper extremity: Secondary | ICD-10-CM | POA: Diagnosis not present

## 2020-12-24 DIAGNOSIS — Z9189 Other specified personal risk factors, not elsewhere classified: Secondary | ICD-10-CM

## 2020-12-24 DIAGNOSIS — M9901 Segmental and somatic dysfunction of cervical region: Secondary | ICD-10-CM | POA: Diagnosis not present

## 2020-12-24 DIAGNOSIS — M9903 Segmental and somatic dysfunction of lumbar region: Secondary | ICD-10-CM | POA: Diagnosis not present

## 2020-12-24 DIAGNOSIS — E559 Vitamin D deficiency, unspecified: Secondary | ICD-10-CM

## 2020-12-24 MED ORDER — SEMAGLUTIDE-WEIGHT MANAGEMENT 2.4 MG/0.75ML ~~LOC~~ SOAJ
SUBCUTANEOUS | 0 refills | Status: DC
  Filled 2020-12-24 – 2021-01-07 (×2): qty 3, 28d supply, fill #0

## 2020-12-24 MED ORDER — VITAMIN D3 1.25 MG (50000 UT) PO CAPS
50000.0000 [IU] | ORAL_CAPSULE | ORAL | 0 refills | Status: DC
Start: 2020-12-24 — End: 2021-01-23
  Filled 2020-12-24: qty 4, 28d supply, fill #0

## 2020-12-24 NOTE — Progress Notes (Signed)
Chief Complaint:   OBESITY Angela [name] is here to discuss her progress with her obesity treatment plan along with follow-up of her obesity related diagnoses. Angela [name] is on keeping a food journal and adhering to recommended goals of 1700-1800 calories and 125+ grams protein and states she is following her eating plan approximately 80% of the time. Angela [name] states she is walking and resistance bands 10-60 minutes 1-2 times per week.  Today's visit was #: 95 Starting weight: 220 lbs Starting date: 10/05/2018 Today's weight: 191 lbs Today's date: 12/24/2020 Total lbs lost to date: 29 Total lbs lost since last in-office visit: 3  Interim History: Angela [name] has plans to go to the [place] this weekend. She has been very mindful of dinner calories and protein content. She averages 1750-1800 calories per day with protein ~125 grams per day. She is doing a good amount of gardening and doing a bit of resistance training.  Subjective:   1. Vitamin D deficiency Freddy denies nausea, vomiting, and muscle weakness but notes fatigue. Pt is on prescription Vit D.  2. Right elbow tendinitis Pt recently did physical therapy. She is taking activity as tolerated.  3. At risk for osteoporosis Angela [name] is at higher risk of osteopenia and osteoporosis due to Vitamin D deficiency.   Assessment/Plan:   1. Vitamin D deficiency Low Vitamin D level contributes to fatigue and are associated with obesity, breast, and colon cancer. She agrees to continue to take prescription Vitamin D 50,000 IU every week and will follow-up for routine testing of Vitamin D, at least 2-3 times per year to avoid over-replacement.  Refill- Cholecalciferol (VITAMIN D3) 1.25 MG (50000 UT) CAPS; Take 1 capsule (50,000 units) by mouth once a week.  Dispense: 4 capsule; Refill: 0  2. Right elbow tendinitis Follow up at next appt.  3. At risk for osteoporosis Angela [name] was given approximately 15 minutes of osteoporosis prevention counseling  today. Angela [name] is at risk for osteopenia and osteoporosis due to her Vitamin D deficiency. She was encouraged to take her Vitamin D and follow her higher calcium diet and increase strengthening exercise to help strengthen her bones and decrease her risk of osteopenia and osteoporosis.  Repetitive spaced learning was employed today to elicit superior memory formation and behavioral change.  4. Obesity with current BMI of 29.1  Angela [name] is currently in the action stage of change. As such, her goal is to continue with weight loss efforts. She has agreed to keeping a food journal and adhering to recommended goals of 1700-1800 calories and 125+ grams protein.   Refill- Semaglutide-Weight Management 2.4 MG/0.75ML SOAJ; INJECT 2.4 MG INTO THE SKIN ONCE A WEEK.  Dispense: 3 mL; Refill: 0  Exercise goals: All adults should avoid inactivity. Some physical activity is better than none, and adults who participate in any amount of physical activity gain some health benefits.  Behavioral modification strategies: increasing lean protein intake, meal planning and cooking strategies, and keeping healthy foods in the home.  Angela [name] has agreed to follow-up with our clinic in 4 weeks. She was informed of the importance of frequent follow-up visits to maximize her success with intensive lifestyle modifications for her multiple health conditions.   Objective:   Blood pressure 113/76, pulse 97, temperature 98.2 F (36.8 C), height '5\' 8"'$  (1.727 m), weight 191 lb (86.6 kg), last menstrual period 12/01/2020, SpO2 98 %. Body mass index is 29.04 kg/m.  General: Cooperative, alert, well developed, in no acute distress. HEENT: Conjunctivae and lids unremarkable. Cardiovascular: Regular  rhythm.  Lungs: Normal work of breathing. Neurologic: No focal deficits.   Lab Results  Component Value Date   CREATININE 0.70 10/30/2020   BUN 12 10/30/2020   NA 138 10/30/2020   K 4.1 10/30/2020   CL 105 10/30/2020   CO2 27  10/30/2020   Lab Results  Component Value Date   ALT 17 10/30/2020   AST 17 10/30/2020   ALKPHOS 53 10/30/2020   BILITOT 0.5 10/30/2020   Lab Results  Component Value Date   HGBA1C 5.6 10/30/2020   HGBA1C 5.5 11/01/2019   HGBA1C 5.5 05/25/2019   HGBA1C 5.5 02/02/2019   HGBA1C 5.7 (H) 10/05/2018   Lab Results  Component Value Date   INSULIN 24.4 11/01/2019   INSULIN 24.5 05/25/2019   INSULIN 21.9 02/02/2019   INSULIN 61.1 (H) 10/05/2018   Lab Results  Component Value Date   TSH 2.020 10/05/2018   Lab Results  Component Value Date   CHOL 144 10/30/2020   HDL 55.50 10/30/2020   LDLCALC 72 10/30/2020   TRIG 81.0 10/30/2020   CHOLHDL 3 10/30/2020   Lab Results  Component Value Date   VD25OH 29.02 (L) 10/30/2020   VD25OH 39.9 11/01/2019   VD25OH 35.6 05/25/2019   Lab Results  Component Value Date   WBC 4.7 10/30/2020   HGB 9.9 (L) 10/30/2020   HCT 29.8 (L) 10/30/2020   MCV 76.7 (L) 10/30/2020   PLT 229.0 10/30/2020   Lab Results  Component Value Date   IRON 38 02/06/2020   TIBC 359 02/06/2020   FERRITIN 26 02/06/2020   Attestation Statements:   Reviewed by clinician on day of visit: allergies, medications, problem list, medical history, surgical history, family history, social history, and previous encounter notes.  Coral Ceo, CMA, am acting as transcriptionist for Coralie Common, MD.  I have reviewed the above documentation for accuracy and completeness, and I agree with the above. - Coralie Common, MD

## 2020-12-25 ENCOUNTER — Other Ambulatory Visit (HOSPITAL_COMMUNITY): Payer: Self-pay

## 2020-12-25 DIAGNOSIS — M9902 Segmental and somatic dysfunction of thoracic region: Secondary | ICD-10-CM | POA: Diagnosis not present

## 2020-12-25 DIAGNOSIS — M9903 Segmental and somatic dysfunction of lumbar region: Secondary | ICD-10-CM | POA: Diagnosis not present

## 2020-12-25 DIAGNOSIS — M9907 Segmental and somatic dysfunction of upper extremity: Secondary | ICD-10-CM | POA: Diagnosis not present

## 2020-12-25 DIAGNOSIS — M9901 Segmental and somatic dysfunction of cervical region: Secondary | ICD-10-CM | POA: Diagnosis not present

## 2020-12-26 ENCOUNTER — Other Ambulatory Visit (HOSPITAL_COMMUNITY): Payer: Self-pay

## 2020-12-26 DIAGNOSIS — M9902 Segmental and somatic dysfunction of thoracic region: Secondary | ICD-10-CM | POA: Diagnosis not present

## 2020-12-26 DIAGNOSIS — M9907 Segmental and somatic dysfunction of upper extremity: Secondary | ICD-10-CM | POA: Diagnosis not present

## 2020-12-26 DIAGNOSIS — M9903 Segmental and somatic dysfunction of lumbar region: Secondary | ICD-10-CM | POA: Diagnosis not present

## 2020-12-26 DIAGNOSIS — M9901 Segmental and somatic dysfunction of cervical region: Secondary | ICD-10-CM | POA: Diagnosis not present

## 2020-12-30 DIAGNOSIS — M9902 Segmental and somatic dysfunction of thoracic region: Secondary | ICD-10-CM | POA: Diagnosis not present

## 2020-12-30 DIAGNOSIS — M9903 Segmental and somatic dysfunction of lumbar region: Secondary | ICD-10-CM | POA: Diagnosis not present

## 2020-12-30 DIAGNOSIS — M9907 Segmental and somatic dysfunction of upper extremity: Secondary | ICD-10-CM | POA: Diagnosis not present

## 2020-12-30 DIAGNOSIS — M9901 Segmental and somatic dysfunction of cervical region: Secondary | ICD-10-CM | POA: Diagnosis not present

## 2020-12-31 DIAGNOSIS — M9901 Segmental and somatic dysfunction of cervical region: Secondary | ICD-10-CM | POA: Diagnosis not present

## 2020-12-31 DIAGNOSIS — M9902 Segmental and somatic dysfunction of thoracic region: Secondary | ICD-10-CM | POA: Diagnosis not present

## 2020-12-31 DIAGNOSIS — M9907 Segmental and somatic dysfunction of upper extremity: Secondary | ICD-10-CM | POA: Diagnosis not present

## 2020-12-31 DIAGNOSIS — M9903 Segmental and somatic dysfunction of lumbar region: Secondary | ICD-10-CM | POA: Diagnosis not present

## 2021-01-01 ENCOUNTER — Encounter (INDEPENDENT_AMBULATORY_CARE_PROVIDER_SITE_OTHER): Payer: Self-pay | Admitting: Family Medicine

## 2021-01-01 DIAGNOSIS — M9901 Segmental and somatic dysfunction of cervical region: Secondary | ICD-10-CM | POA: Diagnosis not present

## 2021-01-01 DIAGNOSIS — M9907 Segmental and somatic dysfunction of upper extremity: Secondary | ICD-10-CM | POA: Diagnosis not present

## 2021-01-01 DIAGNOSIS — M9902 Segmental and somatic dysfunction of thoracic region: Secondary | ICD-10-CM | POA: Diagnosis not present

## 2021-01-01 DIAGNOSIS — M9903 Segmental and somatic dysfunction of lumbar region: Secondary | ICD-10-CM | POA: Diagnosis not present

## 2021-01-02 NOTE — Telephone Encounter (Signed)
Can you check on PA? Thanks, CAS

## 2021-01-04 ENCOUNTER — Other Ambulatory Visit (HOSPITAL_COMMUNITY): Payer: Self-pay

## 2021-01-06 ENCOUNTER — Encounter (INDEPENDENT_AMBULATORY_CARE_PROVIDER_SITE_OTHER): Payer: Self-pay

## 2021-01-06 ENCOUNTER — Other Ambulatory Visit (HOSPITAL_COMMUNITY): Payer: Self-pay

## 2021-01-06 DIAGNOSIS — M9903 Segmental and somatic dysfunction of lumbar region: Secondary | ICD-10-CM | POA: Diagnosis not present

## 2021-01-06 DIAGNOSIS — M9901 Segmental and somatic dysfunction of cervical region: Secondary | ICD-10-CM | POA: Diagnosis not present

## 2021-01-06 DIAGNOSIS — M9902 Segmental and somatic dysfunction of thoracic region: Secondary | ICD-10-CM | POA: Diagnosis not present

## 2021-01-06 DIAGNOSIS — M9907 Segmental and somatic dysfunction of upper extremity: Secondary | ICD-10-CM | POA: Diagnosis not present

## 2021-01-07 ENCOUNTER — Other Ambulatory Visit (HOSPITAL_COMMUNITY): Payer: Self-pay

## 2021-01-07 DIAGNOSIS — M9903 Segmental and somatic dysfunction of lumbar region: Secondary | ICD-10-CM | POA: Diagnosis not present

## 2021-01-07 DIAGNOSIS — M9907 Segmental and somatic dysfunction of upper extremity: Secondary | ICD-10-CM | POA: Diagnosis not present

## 2021-01-07 DIAGNOSIS — M9901 Segmental and somatic dysfunction of cervical region: Secondary | ICD-10-CM | POA: Diagnosis not present

## 2021-01-07 DIAGNOSIS — M9902 Segmental and somatic dysfunction of thoracic region: Secondary | ICD-10-CM | POA: Diagnosis not present

## 2021-01-08 DIAGNOSIS — M9901 Segmental and somatic dysfunction of cervical region: Secondary | ICD-10-CM | POA: Diagnosis not present

## 2021-01-08 DIAGNOSIS — M9903 Segmental and somatic dysfunction of lumbar region: Secondary | ICD-10-CM | POA: Diagnosis not present

## 2021-01-08 DIAGNOSIS — M9902 Segmental and somatic dysfunction of thoracic region: Secondary | ICD-10-CM | POA: Diagnosis not present

## 2021-01-08 DIAGNOSIS — M9907 Segmental and somatic dysfunction of upper extremity: Secondary | ICD-10-CM | POA: Diagnosis not present

## 2021-01-11 ENCOUNTER — Encounter (INDEPENDENT_AMBULATORY_CARE_PROVIDER_SITE_OTHER): Payer: Self-pay | Admitting: Family Medicine

## 2021-01-13 DIAGNOSIS — M9907 Segmental and somatic dysfunction of upper extremity: Secondary | ICD-10-CM | POA: Diagnosis not present

## 2021-01-13 DIAGNOSIS — M9901 Segmental and somatic dysfunction of cervical region: Secondary | ICD-10-CM | POA: Diagnosis not present

## 2021-01-13 DIAGNOSIS — M9903 Segmental and somatic dysfunction of lumbar region: Secondary | ICD-10-CM | POA: Diagnosis not present

## 2021-01-13 DIAGNOSIS — M9902 Segmental and somatic dysfunction of thoracic region: Secondary | ICD-10-CM | POA: Diagnosis not present

## 2021-01-15 DIAGNOSIS — M9901 Segmental and somatic dysfunction of cervical region: Secondary | ICD-10-CM | POA: Diagnosis not present

## 2021-01-15 DIAGNOSIS — M9907 Segmental and somatic dysfunction of upper extremity: Secondary | ICD-10-CM | POA: Diagnosis not present

## 2021-01-15 DIAGNOSIS — M9902 Segmental and somatic dysfunction of thoracic region: Secondary | ICD-10-CM | POA: Diagnosis not present

## 2021-01-15 DIAGNOSIS — M9903 Segmental and somatic dysfunction of lumbar region: Secondary | ICD-10-CM | POA: Diagnosis not present

## 2021-01-17 DIAGNOSIS — M9903 Segmental and somatic dysfunction of lumbar region: Secondary | ICD-10-CM | POA: Diagnosis not present

## 2021-01-17 DIAGNOSIS — M9902 Segmental and somatic dysfunction of thoracic region: Secondary | ICD-10-CM | POA: Diagnosis not present

## 2021-01-17 DIAGNOSIS — M9907 Segmental and somatic dysfunction of upper extremity: Secondary | ICD-10-CM | POA: Diagnosis not present

## 2021-01-17 DIAGNOSIS — M9901 Segmental and somatic dysfunction of cervical region: Secondary | ICD-10-CM | POA: Diagnosis not present

## 2021-01-20 ENCOUNTER — Encounter (INDEPENDENT_AMBULATORY_CARE_PROVIDER_SITE_OTHER): Payer: Self-pay

## 2021-01-21 ENCOUNTER — Ambulatory Visit (INDEPENDENT_AMBULATORY_CARE_PROVIDER_SITE_OTHER): Payer: 59 | Admitting: Family Medicine

## 2021-01-22 DIAGNOSIS — M9901 Segmental and somatic dysfunction of cervical region: Secondary | ICD-10-CM | POA: Diagnosis not present

## 2021-01-22 DIAGNOSIS — M9907 Segmental and somatic dysfunction of upper extremity: Secondary | ICD-10-CM | POA: Diagnosis not present

## 2021-01-22 DIAGNOSIS — M9903 Segmental and somatic dysfunction of lumbar region: Secondary | ICD-10-CM | POA: Diagnosis not present

## 2021-01-22 DIAGNOSIS — M9902 Segmental and somatic dysfunction of thoracic region: Secondary | ICD-10-CM | POA: Diagnosis not present

## 2021-01-23 ENCOUNTER — Encounter (INDEPENDENT_AMBULATORY_CARE_PROVIDER_SITE_OTHER): Payer: Self-pay | Admitting: Family Medicine

## 2021-01-23 ENCOUNTER — Other Ambulatory Visit: Payer: Self-pay

## 2021-01-23 ENCOUNTER — Ambulatory Visit (INDEPENDENT_AMBULATORY_CARE_PROVIDER_SITE_OTHER): Payer: 59 | Admitting: Family Medicine

## 2021-01-23 ENCOUNTER — Other Ambulatory Visit (HOSPITAL_COMMUNITY): Payer: Self-pay

## 2021-01-23 VITALS — BP 116/72 | HR 99 | Temp 98.1°F | Ht 68.0 in | Wt 190.0 lb

## 2021-01-23 DIAGNOSIS — E559 Vitamin D deficiency, unspecified: Secondary | ICD-10-CM | POA: Diagnosis not present

## 2021-01-23 DIAGNOSIS — M9901 Segmental and somatic dysfunction of cervical region: Secondary | ICD-10-CM | POA: Diagnosis not present

## 2021-01-23 DIAGNOSIS — M9903 Segmental and somatic dysfunction of lumbar region: Secondary | ICD-10-CM | POA: Diagnosis not present

## 2021-01-23 DIAGNOSIS — E669 Obesity, unspecified: Secondary | ICD-10-CM | POA: Diagnosis not present

## 2021-01-23 DIAGNOSIS — M9902 Segmental and somatic dysfunction of thoracic region: Secondary | ICD-10-CM | POA: Diagnosis not present

## 2021-01-23 DIAGNOSIS — R7303 Prediabetes: Secondary | ICD-10-CM

## 2021-01-23 DIAGNOSIS — M9907 Segmental and somatic dysfunction of upper extremity: Secondary | ICD-10-CM | POA: Diagnosis not present

## 2021-01-23 DIAGNOSIS — Z6833 Body mass index (BMI) 33.0-33.9, adult: Secondary | ICD-10-CM

## 2021-01-23 MED ORDER — SEMAGLUTIDE-WEIGHT MANAGEMENT 2.4 MG/0.75ML ~~LOC~~ SOAJ
SUBCUTANEOUS | 0 refills | Status: DC
  Filled 2021-01-23: qty 3, fill #0
  Filled 2021-02-03: qty 3, 28d supply, fill #0

## 2021-01-23 MED ORDER — VITAMIN D3 1.25 MG (50000 UT) PO CAPS
50000.0000 [IU] | ORAL_CAPSULE | ORAL | 0 refills | Status: DC
Start: 2021-01-23 — End: 2021-02-20
  Filled 2021-01-23: qty 4, 28d supply, fill #0

## 2021-01-23 NOTE — Progress Notes (Signed)
Chief Complaint:   OBESITY Angela [name] is here to discuss her progress with her obesity treatment plan along with follow-up of her obesity related diagnoses. Angela [name] is on keeping a food journal and adhering to recommended goals of 1700-1800 calories and 125 grams of protein and states she is following her eating plan approximately 80% of the time. Angela [name] states she is doing yard work for 60 minutes 3 times per week and resistance bands for 90 minutes 3 times per week.  Today's visit was #: 63 Starting weight: 220 lbs Starting date: 10/05/2018 Today's weight: 190 lbs Today's date: 01/23/2021 Total lbs lost to date: 30 lbs Total lbs lost since last in-office visit: 1 lb   Interim History: Angela [name] has lost 30 lbs since starting our program in June 2020. She is journaling 5 out of the 7 days per week. Her calories average 1700-1800 calories per day. Her protein averages about 30 grams per meal.  Her water intake is 48 oz per day. Her weight goal is 180 lbs (27 BMI). I feel this is a reasonable goal for her. She is doing a lot of yard work now but plans on finding a way to [employer] n cardio after the weather cools.   Subjective:   1. Prediabetes Maryellen is on Wegovy 2.4 mg weekly and she is tolerating it well. She feels it is provides good appetite suppression and satiety. Her last A1C was 5.6.  Lab Results  Component Value Date   HGBA1C 5.6 10/30/2020   Lab Results  Component Value Date   INSULIN 24.4 11/01/2019   INSULIN 24.5 05/25/2019   INSULIN 21.9 02/02/2019   INSULIN 61.1 (H) 10/05/2018    2. Vitamin D deficiency Angela [name] is on 50,000 units of weekly Vitamin D3. Her last Vitamin D was low at 29.0. Angela [name] was switched to D3 because she was not absorbing D2 well.   Lab Results  Component Value Date   VD25OH 29.02 (L) 10/30/2020   VD25OH 39.9 11/01/2019   VD25OH 35.6 05/25/2019    Assessment/Plan:   1. Prediabetes We will refill Wegovy 2.4 mg weekly.   2. Vitamin D  deficiency  We will refill prescription Vitamin D3  50,000 IU once a week and Karizma will follow-up for routine testing of Vitamin D, at least 2-3 times per year to avoid over-replacement.  - Cholecalciferol (VITAMIN D3) 1.25 MG (50000 UT) CAPS; Take 1 capsule (50,000 units) by mouth once a week.  Dispense: 4 capsule; Refill: 0  3. Obesity with current BMI of 28.9 Angela [name] is currently in the action stage of change. As such, her goal is to continue with weight loss efforts. She has agreed to keeping a food journal and adhering to recommended goals of 1700-1800 calories and 125 grams of protein daily.  - Semaglutide-Weight Management 2.4 MG/0.75ML SOAJ; INJECT 2.4 MG INTO THE SKIN ONCE A WEEK.  Dispense: 3 mL; Refill: 0  Exercise goals:  As is.  Behavioral modification strategies: increasing lean protein intake, increasing water intake, and better snacking choices.  Angela [name] has agreed to follow-up with our clinic in 4 weeks with Dr. Jearld Shines.   Objective:   Blood pressure 116/72, pulse 99, temperature 98.1 F (36.7 C), height 5\' 8"  (1.727 m), weight 190 lb (86.2 kg), SpO2 99 %. Body mass index is 28.89 kg/m.  General: Cooperative, alert, well developed, in no acute distress. HEENT: Conjunctivae and lids unremarkable. Cardiovascular: Regular rhythm.  Lungs: Normal work of breathing. Neurologic: No focal deficits.  Lab Results  Component Value Date   CREATININE 0.70 10/30/2020   BUN 12 10/30/2020   NA 138 10/30/2020   K 4.1 10/30/2020   CL 105 10/30/2020   CO2 27 10/30/2020   Lab Results  Component Value Date   ALT 17 10/30/2020   AST 17 10/30/2020   ALKPHOS 53 10/30/2020   BILITOT 0.5 10/30/2020   Lab Results  Component Value Date   HGBA1C 5.6 10/30/2020   HGBA1C 5.5 11/01/2019   HGBA1C 5.5 05/25/2019   HGBA1C 5.5 02/02/2019   HGBA1C 5.7 (H) 10/05/2018   Lab Results  Component Value Date   INSULIN 24.4 11/01/2019   INSULIN 24.5 05/25/2019   INSULIN 21.9 02/02/2019    INSULIN 61.1 (H) 10/05/2018   Lab Results  Component Value Date   TSH 2.020 10/05/2018   Lab Results  Component Value Date   CHOL 144 10/30/2020   HDL 55.50 10/30/2020   LDLCALC 72 10/30/2020   TRIG 81.0 10/30/2020   CHOLHDL 3 10/30/2020   Lab Results  Component Value Date   VD25OH 29.02 (L) 10/30/2020   VD25OH 39.9 11/01/2019   VD25OH 35.6 05/25/2019   Lab Results  Component Value Date   WBC 4.7 10/30/2020   HGB 9.9 (L) 10/30/2020   HCT 29.8 (L) 10/30/2020   MCV 76.7 (L) 10/30/2020   PLT 229.0 10/30/2020   Lab Results  Component Value Date   IRON 38 02/06/2020   TIBC 359 02/06/2020   FERRITIN 26 02/06/2020   Attestation Statements:   Reviewed by clinician on day of visit: allergies, medications, problem list, medical history, surgical history, family history, social history, and previous encounter notes.  I, Lizbeth Bark, RMA, am acting as Location manager for [employer], [place].   I have reviewed the above documentation for accuracy and completeness, and I agree with the above. -  Georgianne Fick, FNP

## 2021-01-24 DIAGNOSIS — Z6833 Body mass index (BMI) 33.0-33.9, adult: Secondary | ICD-10-CM | POA: Insufficient documentation

## 2021-01-24 DIAGNOSIS — E669 Obesity, unspecified: Secondary | ICD-10-CM | POA: Insufficient documentation

## 2021-01-28 DIAGNOSIS — M9903 Segmental and somatic dysfunction of lumbar region: Secondary | ICD-10-CM | POA: Diagnosis not present

## 2021-01-28 DIAGNOSIS — M9907 Segmental and somatic dysfunction of upper extremity: Secondary | ICD-10-CM | POA: Diagnosis not present

## 2021-01-28 DIAGNOSIS — M9901 Segmental and somatic dysfunction of cervical region: Secondary | ICD-10-CM | POA: Diagnosis not present

## 2021-01-28 DIAGNOSIS — M9902 Segmental and somatic dysfunction of thoracic region: Secondary | ICD-10-CM | POA: Diagnosis not present

## 2021-01-30 DIAGNOSIS — M9903 Segmental and somatic dysfunction of lumbar region: Secondary | ICD-10-CM | POA: Diagnosis not present

## 2021-01-30 DIAGNOSIS — M9907 Segmental and somatic dysfunction of upper extremity: Secondary | ICD-10-CM | POA: Diagnosis not present

## 2021-01-30 DIAGNOSIS — M9901 Segmental and somatic dysfunction of cervical region: Secondary | ICD-10-CM | POA: Diagnosis not present

## 2021-01-30 DIAGNOSIS — M9902 Segmental and somatic dysfunction of thoracic region: Secondary | ICD-10-CM | POA: Diagnosis not present

## 2021-02-03 ENCOUNTER — Other Ambulatory Visit (HOSPITAL_COMMUNITY): Payer: Self-pay

## 2021-02-05 DIAGNOSIS — M9901 Segmental and somatic dysfunction of cervical region: Secondary | ICD-10-CM | POA: Diagnosis not present

## 2021-02-05 DIAGNOSIS — M9903 Segmental and somatic dysfunction of lumbar region: Secondary | ICD-10-CM | POA: Diagnosis not present

## 2021-02-05 DIAGNOSIS — M9907 Segmental and somatic dysfunction of upper extremity: Secondary | ICD-10-CM | POA: Diagnosis not present

## 2021-02-05 DIAGNOSIS — M9902 Segmental and somatic dysfunction of thoracic region: Secondary | ICD-10-CM | POA: Diagnosis not present

## 2021-02-12 DIAGNOSIS — M9902 Segmental and somatic dysfunction of thoracic region: Secondary | ICD-10-CM | POA: Diagnosis not present

## 2021-02-12 DIAGNOSIS — M9903 Segmental and somatic dysfunction of lumbar region: Secondary | ICD-10-CM | POA: Diagnosis not present

## 2021-02-12 DIAGNOSIS — M9907 Segmental and somatic dysfunction of upper extremity: Secondary | ICD-10-CM | POA: Diagnosis not present

## 2021-02-12 DIAGNOSIS — M9901 Segmental and somatic dysfunction of cervical region: Secondary | ICD-10-CM | POA: Diagnosis not present

## 2021-02-14 ENCOUNTER — Other Ambulatory Visit: Payer: Self-pay | Admitting: Gastroenterology

## 2021-02-14 DIAGNOSIS — K5909 Other constipation: Secondary | ICD-10-CM

## 2021-02-15 ENCOUNTER — Other Ambulatory Visit (HOSPITAL_COMMUNITY): Payer: Self-pay

## 2021-02-18 ENCOUNTER — Other Ambulatory Visit (HOSPITAL_COMMUNITY): Payer: Self-pay

## 2021-02-18 ENCOUNTER — Other Ambulatory Visit: Payer: Self-pay

## 2021-02-18 DIAGNOSIS — K5909 Other constipation: Secondary | ICD-10-CM

## 2021-02-18 MED ORDER — LINACLOTIDE 145 MCG PO CAPS
ORAL_CAPSULE | ORAL | 0 refills | Status: DC
  Filled 2021-02-18: qty 90, 90d supply, fill #0

## 2021-02-18 NOTE — Progress Notes (Signed)
Sent in refill for linzess as requested

## 2021-02-20 ENCOUNTER — Other Ambulatory Visit (HOSPITAL_COMMUNITY): Payer: Self-pay

## 2021-02-20 ENCOUNTER — Ambulatory Visit (INDEPENDENT_AMBULATORY_CARE_PROVIDER_SITE_OTHER): Payer: 59 | Admitting: Family Medicine

## 2021-02-20 ENCOUNTER — Encounter (INDEPENDENT_AMBULATORY_CARE_PROVIDER_SITE_OTHER): Payer: Self-pay | Admitting: Family Medicine

## 2021-02-20 ENCOUNTER — Other Ambulatory Visit: Payer: Self-pay

## 2021-02-20 VITALS — BP 115/71 | HR 84 | Temp 98.3°F | Ht 68.0 in | Wt 187.0 lb

## 2021-02-20 DIAGNOSIS — Z6833 Body mass index (BMI) 33.0-33.9, adult: Secondary | ICD-10-CM | POA: Diagnosis not present

## 2021-02-20 DIAGNOSIS — E559 Vitamin D deficiency, unspecified: Secondary | ICD-10-CM | POA: Diagnosis not present

## 2021-02-20 DIAGNOSIS — D509 Iron deficiency anemia, unspecified: Secondary | ICD-10-CM

## 2021-02-20 DIAGNOSIS — E669 Obesity, unspecified: Secondary | ICD-10-CM

## 2021-02-20 MED ORDER — VITAMIN D3 1.25 MG (50000 UT) PO CAPS
50000.0000 [IU] | ORAL_CAPSULE | ORAL | 0 refills | Status: DC
  Filled 2021-02-20: qty 4, 28d supply, fill #0

## 2021-02-20 MED ORDER — SEMAGLUTIDE-WEIGHT MANAGEMENT 2.4 MG/0.75ML ~~LOC~~ SOAJ
SUBCUTANEOUS | 0 refills | Status: DC
  Filled 2021-02-20: qty 3, fill #0
  Filled 2021-03-03: qty 3, 28d supply, fill #0

## 2021-02-20 MED ORDER — FUSION PLUS PO CAPS
1.0000 | ORAL_CAPSULE | ORAL | 0 refills | Status: DC
  Filled 2021-02-20: qty 30, 70d supply, fill #0

## 2021-02-20 NOTE — Progress Notes (Signed)
Chief Complaint:   OBESITY Angela [name] is here to discuss her progress with her obesity treatment plan along with follow-up of her obesity related diagnoses. Angela [name] is on keeping a food journal and adhering to recommended goals of 1700-1800 calories and 125 grams protein and states she is following her eating plan approximately 80% of the time. Angela [name] states she is walking and resistance training 10-15 minutes 1-2 times per week.  Today's visit was #: 52 Starting weight: 220 lbs Starting date: 10/05/2018 Today's weight: 187 lbs Today's date: 02/20/2021 Total lbs lost to date: 33 Total lbs lost since last in-office visit: 3  Interim History: Angela [name] has not journaled much the last weeks but prior to that was really on plan. She denies hunger less than 2 hours after meals and gets full very quickly. Her husband's birthday is coming up. Plan wise, she is averaging about 1600 calories and 100 grams protein.  Subjective:   1. Iron deficiency anemia, unspecified iron deficiency anemia type Angela [name] is on fusion plus iron. She reports fatigue. Her last H/H was low.  2. Vitamin D deficiency Pt denies nausea, vomiting, and muscle weakness but notes fatigue. She is on prescription Vit D.  Assessment/Plan:   1. Iron deficiency anemia, unspecified iron deficiency anemia type Orders and follow up as documented in patient record.  Counseling Iron is essential for our bodies to make red blood cells.  Reasons that someone may be deficient include: an iron-deficient diet (more likely in those following vegan or vegetarian diets), women with heavy menses, patients with GI disorders or poor absorption, patients that have had bariatric surgery, frequent blood donors, patients with cancer, and patients with heart disease.   An iron supplement has been recommended. This is found over-the-counter.   Iron-rich foods include dark leafy greens, red and white meats, eggs, seafood, and beans.   Certain foods and  drinks prevent your body from absorbing iron properly. Avoid eating these foods in the same meal as iron-rich foods or with iron supplements. These foods include: coffee, black tea, and red wine; milk, dairy products, and foods that are high in calcium; beans and soybeans; whole grains.  Constipation can be a side effect of iron supplementation. Increased water and fiber intake are helpful. Water goal: > 2 liters/day. Fiber goal: > 25 grams/day.  Refill- Iron-FA-B Cmp-C-Biot-Probiotic (FUSION PLUS) CAPS; Take 1 capsule by mouth 3 (three) times a week.  Dispense: 30 capsule; Refill: 0  2. Vitamin D deficiency Low Vitamin D level contributes to fatigue and are associated with obesity, breast, and colon cancer. She agrees to continue to take prescription Vitamin D 50,000 IU every week and will follow-up for routine testing of Vitamin D, at least 2-3 times per year to avoid over-replacement.  Refill- Cholecalciferol (VITAMIN D3) 1.25 MG (50000 UT) CAPS; Take 1 capsule (50,000 units) by mouth once a week.  Dispense: 4 capsule; Refill: 0  3. Obesity with current BMI of 28.9  Angela [name] is currently in the action stage of change. As such, her goal is to continue with weight loss efforts. She has agreed to keeping a food journal and adhering to recommended goals of 1700-1800 calories and 125+ grams protein.   We discussed various medication options to help Angela [name] with her weight loss efforts and we both agreed to continue [facility] as directed. Refill- Semaglutide-Weight Management 2.4 MG/0.75ML SOAJ; INJECT 2.4 MG INTO THE SKIN ONCE A WEEK.  Dispense: 3 mL; Refill: 0  Exercise goals:  Increase resistance training  to 3 times a week.  Behavioral modification strategies: increasing lean protein intake, meal planning and cooking strategies, and keeping healthy foods in the home.  Angela [name] has agreed to follow-up with our clinic in 4-5 weeks. She was informed of the importance of frequent follow-up visits to  maximize her success with intensive lifestyle modifications for her multiple health conditions.   Objective:   Blood pressure 115/71, pulse 84, temperature 98.3 F (36.8 C), height 5\' 8"  (1.727 m), weight 187 lb (84.8 kg), last menstrual period 01/30/2021, SpO2 100 %. Body mass index is 28.43 kg/m.  General: Cooperative, alert, well developed, in no acute distress. HEENT: Conjunctivae and lids unremarkable. Cardiovascular: Regular rhythm.  Lungs: Normal work of breathing. Neurologic: No focal deficits.   Lab Results  Component Value Date   CREATININE 0.70 10/30/2020   BUN 12 10/30/2020   NA 138 10/30/2020   K 4.1 10/30/2020   CL 105 10/30/2020   CO2 27 10/30/2020   Lab Results  Component Value Date   ALT 17 10/30/2020   AST 17 10/30/2020   ALKPHOS 53 10/30/2020   BILITOT 0.5 10/30/2020   Lab Results  Component Value Date   HGBA1C 5.6 10/30/2020   HGBA1C 5.5 11/01/2019   HGBA1C 5.5 05/25/2019   HGBA1C 5.5 02/02/2019   HGBA1C 5.7 (H) 10/05/2018   Lab Results  Component Value Date   INSULIN 24.4 11/01/2019   INSULIN 24.5 05/25/2019   INSULIN 21.9 02/02/2019   INSULIN 61.1 (H) 10/05/2018   Lab Results  Component Value Date   TSH 2.020 10/05/2018   Lab Results  Component Value Date   CHOL 144 10/30/2020   HDL 55.50 10/30/2020   LDLCALC 72 10/30/2020   TRIG 81.0 10/30/2020   CHOLHDL 3 10/30/2020   Lab Results  Component Value Date   VD25OH 29.02 (L) 10/30/2020   VD25OH 39.9 11/01/2019   VD25OH 35.6 05/25/2019   Lab Results  Component Value Date   WBC 4.7 10/30/2020   HGB 9.9 (L) 10/30/2020   HCT 29.8 (L) 10/30/2020   MCV 76.7 (L) 10/30/2020   PLT 229.0 10/30/2020   Lab Results  Component Value Date   IRON 38 02/06/2020   TIBC 359 02/06/2020   FERRITIN 26 02/06/2020    Attestation Statements:   Reviewed by clinician on day of visit: allergies, medications, problem list, medical history, surgical history, family history, social history, and  previous encounter notes.  Coral Ceo, CMA, am acting as transcriptionist for Coralie Common, MD.  I have reviewed the above documentation for accuracy and completeness, and I agree with the above. - Coralie Common, MD

## 2021-02-25 IMAGING — MG DIGITAL SCREENING BILAT W/ TOMO W/ CAD
6 of 10 series · 6 of 30 positions shown · non-contrast
Comparison: Previous exam(s).

CLINICAL DATA: Screening.

EXAM:
DIGITAL SCREENING BILATERAL MAMMOGRAM WITH TOMO AND CAD

[L MLO synth-2D (1 of 2)]
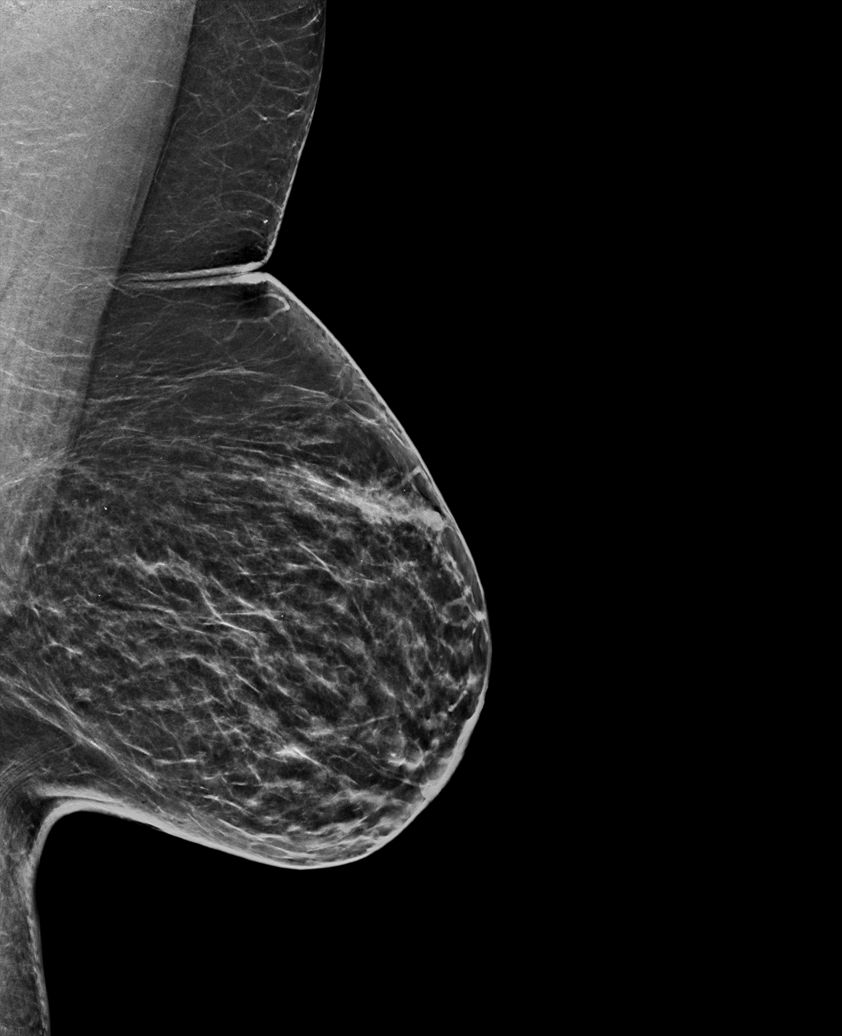

[L MLO synth-2D (2 of 2)]
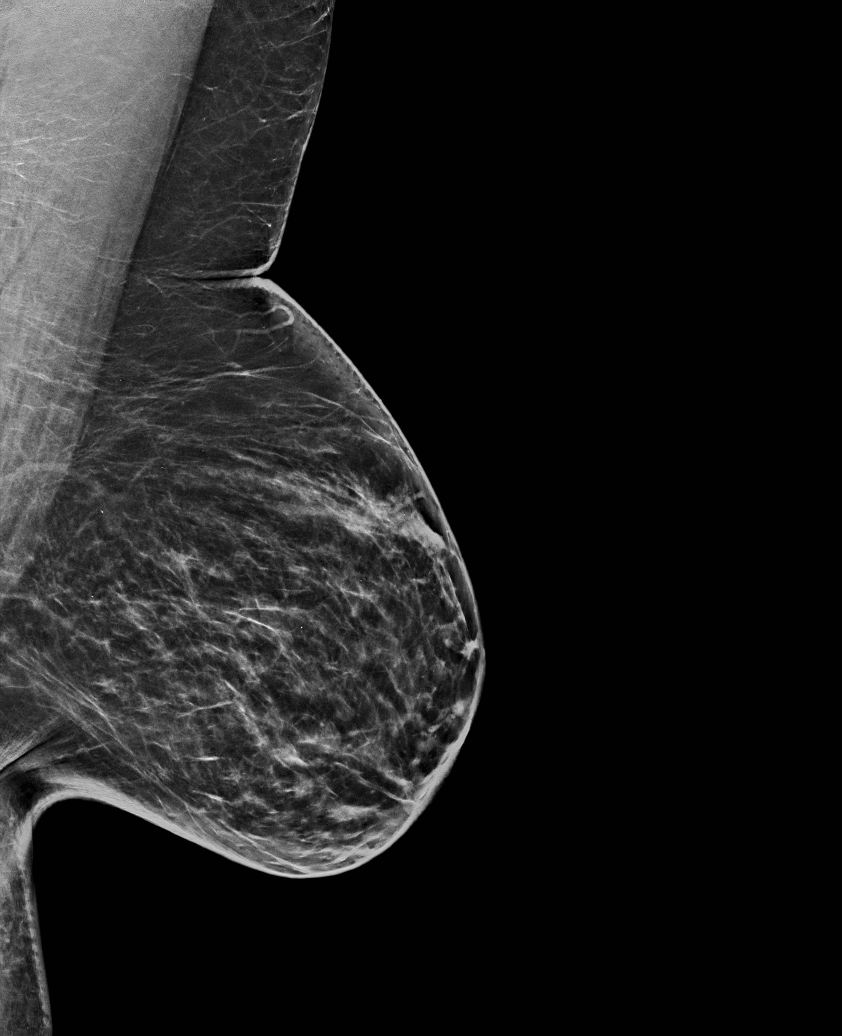

[R CC synth-2D]
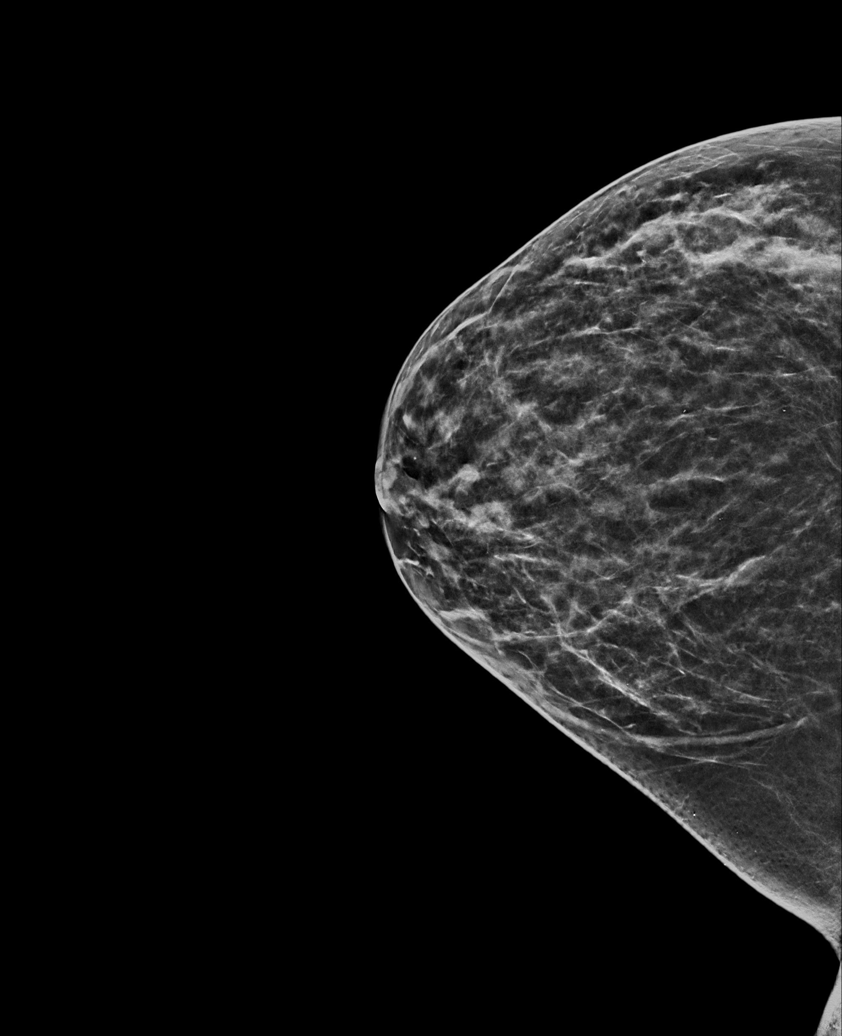

[L CC synth-2D]
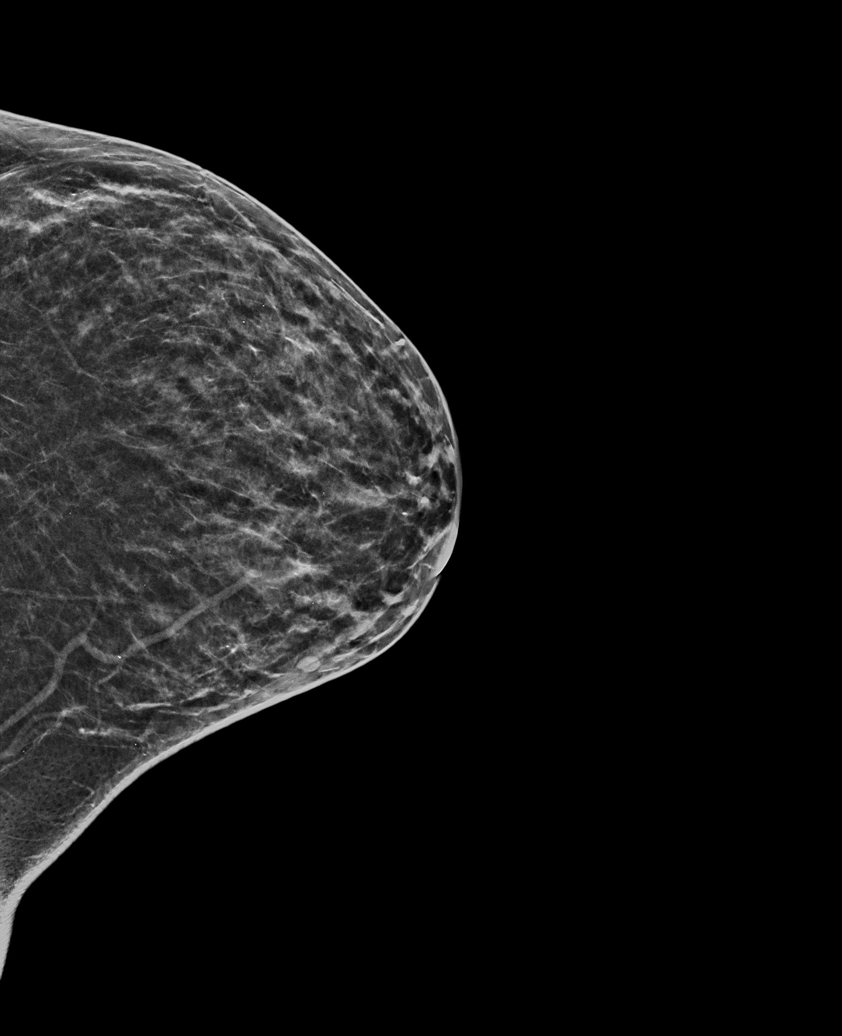

[R MLO synth-2D]
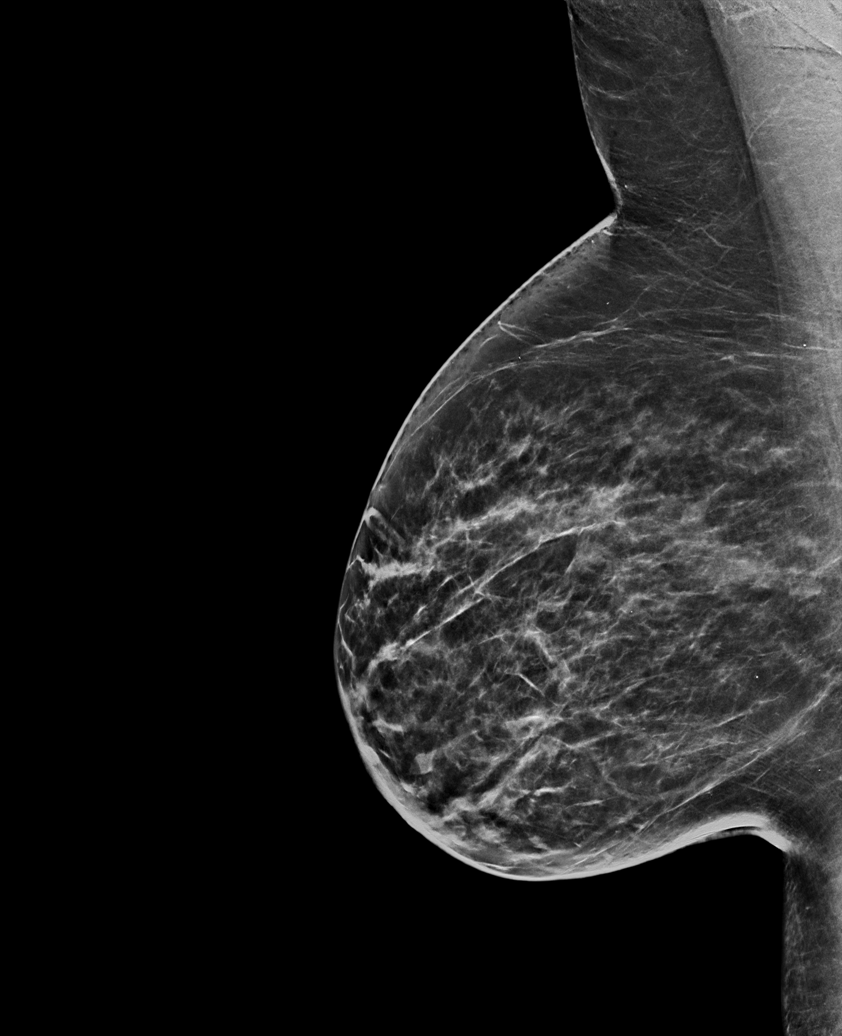

[L MLO tomo · tomo slice 35/70.0]
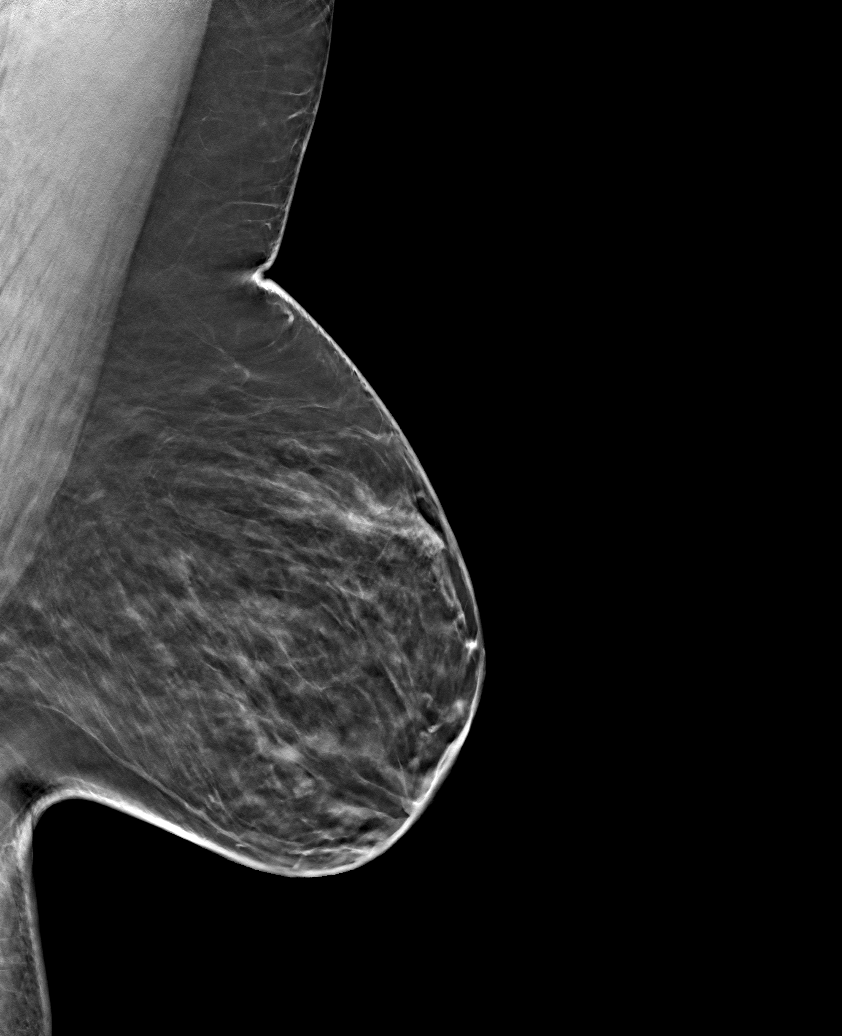

[6 of 30 positions shown; findings below may reference images not displayed]

ACR Breast Density Category c: The breast tissue is heterogeneously
dense, which may obscure small masses.
FINDINGS: There are no findings suspicious for malignancy. Images were
processed with CAD.
IMPRESSION: No mammographic evidence of malignancy. A result letter of this
screening mammogram will be mailed directly to the patient.

RECOMMENDATION:
Screening mammogram in one year. (Code:[id number])

BI-RADS CATEGORY  1: Negative.

## 2021-03-03 ENCOUNTER — Other Ambulatory Visit (HOSPITAL_COMMUNITY): Payer: Self-pay

## 2021-03-08 ENCOUNTER — Other Ambulatory Visit (HOSPITAL_COMMUNITY): Payer: Self-pay

## 2021-03-08 ENCOUNTER — Other Ambulatory Visit (INDEPENDENT_AMBULATORY_CARE_PROVIDER_SITE_OTHER): Payer: Self-pay

## 2021-03-10 ENCOUNTER — Other Ambulatory Visit (HOSPITAL_COMMUNITY): Payer: Self-pay

## 2021-03-10 MED ORDER — BRIMONIDINE TARTRATE-TIMOLOL 0.2-0.5 % OP SOLN
OPHTHALMIC | 11 refills | Status: DC
  Filled 2021-03-10: qty 10, 80d supply, fill #0
  Filled 2021-05-30: qty 5, 40d supply, fill #1
  Filled 2021-07-25: qty 5, 40d supply, fill #2
  Filled 2021-09-25: qty 5, 40d supply, fill #3
  Filled 2021-11-01: qty 5, 40d supply, fill #4
  Filled 2021-12-25: qty 5, 40d supply, fill #5
  Filled 2022-01-29: qty 5, 40d supply, fill #6

## 2021-03-10 MED ORDER — ATROPINE SULFATE 1 % OP SOLN
OPHTHALMIC | 11 refills | Status: DC
  Filled 2021-03-10: qty 5, 80d supply, fill #0
  Filled 2021-05-30: qty 5, 80d supply, fill #1
  Filled 2021-07-25 – 2021-08-08 (×2): qty 5, 80d supply, fill #2
  Filled 2021-11-01: qty 5, 80d supply, fill #3
  Filled 2022-01-29: qty 5, 80d supply, fill #4

## 2021-03-26 ENCOUNTER — Other Ambulatory Visit (HOSPITAL_COMMUNITY): Payer: Self-pay

## 2021-03-26 ENCOUNTER — Ambulatory Visit (INDEPENDENT_AMBULATORY_CARE_PROVIDER_SITE_OTHER): Payer: 59 | Admitting: Family Medicine

## 2021-03-26 ENCOUNTER — Other Ambulatory Visit: Payer: Self-pay

## 2021-03-26 ENCOUNTER — Encounter (INDEPENDENT_AMBULATORY_CARE_PROVIDER_SITE_OTHER): Payer: Self-pay | Admitting: Family Medicine

## 2021-03-26 VITALS — BP 110/71 | HR 82 | Temp 98.3°F | Ht 68.0 in | Wt 183.0 lb

## 2021-03-26 DIAGNOSIS — E559 Vitamin D deficiency, unspecified: Secondary | ICD-10-CM

## 2021-03-26 DIAGNOSIS — Z6833 Body mass index (BMI) 33.0-33.9, adult: Secondary | ICD-10-CM

## 2021-03-26 DIAGNOSIS — E669 Obesity, unspecified: Secondary | ICD-10-CM | POA: Diagnosis not present

## 2021-03-26 DIAGNOSIS — D509 Iron deficiency anemia, unspecified: Secondary | ICD-10-CM

## 2021-03-26 MED ORDER — VITAMIN D3 1.25 MG (50000 UT) PO CAPS
50000.0000 [IU] | ORAL_CAPSULE | ORAL | 0 refills | Status: DC
  Filled 2021-03-26: qty 12, 84d supply, fill #0

## 2021-03-26 MED ORDER — SEMAGLUTIDE-WEIGHT MANAGEMENT 2.4 MG/0.75ML ~~LOC~~ SOAJ
SUBCUTANEOUS | 0 refills | Status: DC
  Filled 2021-03-26: qty 3, 28d supply, fill #0
  Filled 2021-05-05: qty 3, 28d supply, fill #1

## 2021-03-26 NOTE — Progress Notes (Signed)
Chief Complaint:   OBESITY Angela [name] is here to discuss her progress with her obesity treatment plan along with follow-up of her obesity related diagnoses. Angela [name] is on keeping a food journal and adhering to recommended goals of 1700-1800 calories and 125 grams protein and states she is following her eating plan approximately 70-75% of the time. Angela [name] states she is doing weights or walking 15 minutes 4 times per week.  Today's visit was #: 31 Starting weight: 220 lbs Starting date: 10/05/2018 Today's weight: 183 lbs Today's date: 03/26/2021 Total lbs lost to date: 37 Total lbs lost since last in-office visit: 4  Interim History: Angela [name] worked on Thanksgiving so she celebrated a few days later. For the month of December, she is going to [place] from Dec 22-27th. She is doing well on Wegovy. For the last month, pt has done category 4. She is trying to do some more consistent physical activity.  Subjective:   1. Vitamin D deficiency Pt denies nausea, vomiting, and muscle weakness but notes fatigue. Her last Vit D level was 29.02.  2. Iron deficiency anemia, unspecified iron deficiency anemia type Angela [name] is on fusion plus. Her last hemoglobin and hematocrit were low and MCV 76.  Assessment/Plan:   1. Vitamin D deficiency Low Vitamin D level contributes to fatigue and are associated with obesity, breast, and colon cancer. She agrees to continue to take prescription Vitamin D 50,000 IU every week and will follow-up for routine testing of Vitamin D, at least 2-3 times per year to avoid over-replacement.  Refill- Cholecalciferol (VITAMIN D3) 1.25 MG (50000 UT) CAPS; Take 1 capsule (50,000 units) by mouth once a week.  Dispense: 12 capsule; Refill: 0  2. Iron deficiency anemia, unspecified iron deficiency anemia type Orders and follow up as documented in patient record. Check labs at next appt.  Counseling Iron is essential for our bodies to make red blood cells.  Reasons that someone  may be deficient include: an iron-deficient diet (more likely in those following vegan or vegetarian diets), women with heavy menses, patients with GI disorders or poor absorption, patients that have had bariatric surgery, frequent blood donors, patients with cancer, and patients with heart disease.   Iron-rich foods include dark leafy greens, red and white meats, eggs, seafood, and beans.   Certain foods and drinks prevent your body from absorbing iron properly. Avoid eating these foods in the same meal as iron-rich foods or with iron supplements. These foods include: coffee, black tea, and red wine; milk, dairy products, and foods that are high in calcium; beans and soybeans; whole grains.  Constipation can be a side effect of iron supplementation. Increased water and fiber intake are helpful. Water goal: > 2 liters/day. Fiber goal: > 25 grams/day.  3. Obesity with current BMI of 27.9  Angela [name] is currently in the action stage of change. As such, her goal is to continue with weight loss efforts. She has agreed to the Category 4 Plan and keeping a food journal and adhering to recommended goals of 1700-1800 calories and 125+ grams protein.   We discussed various medication options to help Angela [name] with her weight loss efforts and we both agreed to continue [facility] as prescribed. Refill- Semaglutide-Weight Management 2.4 MG/0.75ML SOAJ; INJECT 2.4 MG INTO THE SKIN ONCE A WEEK.  Dispense: 9 mL; Refill: 0  Exercise goals:  As is- Pt is to start contemplating what physical activity she may like to consistently incorporate.  Behavioral modification strategies: increasing lean protein intake, meal  planning and cooking strategies, keeping healthy foods in the home, travel eating strategies, and holiday eating strategies .  Angela [name] has agreed to follow-up with our clinic in 6 weeks, fasting. She was informed of the importance of frequent follow-up visits to maximize her success with intensive lifestyle  modifications for her multiple health conditions.   Objective:   Blood pressure 110/71, pulse 82, temperature 98.3 F (36.8 C), height 5\' 8"  (1.727 m), weight 183 lb (83 kg), SpO2 100 %. Body mass index is 27.83 kg/m.  General: Cooperative, alert, well developed, in no acute distress. HEENT: Conjunctivae and lids unremarkable. Cardiovascular: Regular rhythm.  Lungs: Normal work of breathing. Neurologic: No focal deficits.   Lab Results  Component Value Date   CREATININE 0.70 10/30/2020   BUN 12 10/30/2020   NA 138 10/30/2020   K 4.1 10/30/2020   CL 105 10/30/2020   CO2 27 10/30/2020   Lab Results  Component Value Date   ALT 17 10/30/2020   AST 17 10/30/2020   ALKPHOS 53 10/30/2020   BILITOT 0.5 10/30/2020   Lab Results  Component Value Date   HGBA1C 5.6 10/30/2020   HGBA1C 5.5 11/01/2019   HGBA1C 5.5 05/25/2019   HGBA1C 5.5 02/02/2019   HGBA1C 5.7 (H) 10/05/2018   Lab Results  Component Value Date   INSULIN 24.4 11/01/2019   INSULIN 24.5 05/25/2019   INSULIN 21.9 02/02/2019   INSULIN 61.1 (H) 10/05/2018   Lab Results  Component Value Date   TSH 2.020 10/05/2018   Lab Results  Component Value Date   CHOL 144 10/30/2020   HDL 55.50 10/30/2020   LDLCALC 72 10/30/2020   TRIG 81.0 10/30/2020   CHOLHDL 3 10/30/2020   Lab Results  Component Value Date   VD25OH 29.02 (L) 10/30/2020   VD25OH 39.9 11/01/2019   VD25OH 35.6 05/25/2019   Lab Results  Component Value Date   WBC 4.7 10/30/2020   HGB 9.9 (L) 10/30/2020   HCT 29.8 (L) 10/30/2020   MCV 76.7 (L) 10/30/2020   PLT 229.0 10/30/2020   Lab Results  Component Value Date   IRON 38 02/06/2020   TIBC 359 02/06/2020   FERRITIN 26 02/06/2020   Attestation Statements:   Reviewed by clinician on day of visit: allergies, medications, problem list, medical history, surgical history, family history, social history, and previous encounter notes.  Coral Ceo, CMA, am acting as transcriptionist for  Coralie Common, MD.   I have reviewed the above documentation for accuracy and completeness, and I agree with the above. - Coralie Common, MD

## 2021-05-04 ENCOUNTER — Encounter (INDEPENDENT_AMBULATORY_CARE_PROVIDER_SITE_OTHER): Payer: Self-pay | Admitting: Family Medicine

## 2021-05-05 ENCOUNTER — Other Ambulatory Visit (HOSPITAL_COMMUNITY): Payer: Self-pay

## 2021-05-07 ENCOUNTER — Other Ambulatory Visit (HOSPITAL_COMMUNITY): Payer: Self-pay

## 2021-05-07 ENCOUNTER — Ambulatory Visit (INDEPENDENT_AMBULATORY_CARE_PROVIDER_SITE_OTHER): Payer: 59 | Admitting: Family Medicine

## 2021-05-13 ENCOUNTER — Ambulatory Visit (INDEPENDENT_AMBULATORY_CARE_PROVIDER_SITE_OTHER): Payer: 59 | Admitting: Family Medicine

## 2021-05-13 ENCOUNTER — Encounter (INDEPENDENT_AMBULATORY_CARE_PROVIDER_SITE_OTHER): Payer: Self-pay | Admitting: Family Medicine

## 2021-05-13 ENCOUNTER — Other Ambulatory Visit: Payer: Self-pay

## 2021-05-13 VITALS — BP 117/73 | HR 80 | Temp 98.2°F | Ht 68.0 in | Wt 183.0 lb

## 2021-05-13 DIAGNOSIS — Z6827 Body mass index (BMI) 27.0-27.9, adult: Secondary | ICD-10-CM

## 2021-05-13 DIAGNOSIS — D509 Iron deficiency anemia, unspecified: Secondary | ICD-10-CM | POA: Diagnosis not present

## 2021-05-13 DIAGNOSIS — E559 Vitamin D deficiency, unspecified: Secondary | ICD-10-CM | POA: Diagnosis not present

## 2021-05-13 DIAGNOSIS — R7303 Prediabetes: Secondary | ICD-10-CM | POA: Diagnosis not present

## 2021-05-13 DIAGNOSIS — E669 Obesity, unspecified: Secondary | ICD-10-CM

## 2021-05-13 NOTE — Progress Notes (Signed)
Chief Complaint:   OBESITY Angela [name] is here to discuss her progress with her obesity treatment plan along with follow-up of her obesity related diagnoses. Angela [name] is on the Category 4 Plan and keeping a food journal and adhering to recommended goals of 1700-1800 calories and 125 grams of protein and states she is following her eating plan approximately 70% of the time. Angela [name] states she is walking for 30-40 minutes 3 times per week.  Today's visit was #: 45 Starting weight: 220 lbs Starting date: 10/05/2018 Today's weight: 183 lbs Today's date: 05/13/2021 Total lbs lost to date: 37 lbs Total lbs lost since last in-office visit: 0  Interim History: Angela [name] is satisfied with weight maintenance over the holidays. She has been focusing on Category 4 over past few weeks vs journaling for more structure. Her water intake is good. She is getting in adequate protein.  Subjective:   1. Iron deficiency anemia, unspecified iron deficiency anemia type Angela [name] is on iron 3 times weekly (Fusion Plus). She has heavy periods.   CBC Latest Ref Rng & Units 10/30/2020 02/06/2020 11/20/2019  WBC 4.0 - 10.5 K/uL 4.7 6.4 5.4  Hemoglobin 12.0 - 15.0 g/dL 9.9(L) 11.6 11.3(L)  Hematocrit 36.0 - 46.0 % 29.8(L) 36.0 33.5(L)  Platelets 150.0 - 400.0 K/uL 229.0 233 215.0   Lab Results  Component Value Date   IRON 38 02/06/2020   TIBC 359 02/06/2020   FERRITIN 26 02/06/2020   Lab Results  Component Value Date   VITAMINB12 624 11/01/2019     2. Pre-diabetes Angela [name] is on Wegovy 2.4 mg weekly. Her constipation is well-managed with Linzess. She notes that [facility] did not worsen her constipation.  She denies nausea. Her appetite well-controlled.  Lab Results  Component Value Date   HGBA1C 5.6 10/30/2020   Lab Results  Component Value Date   INSULIN 24.4 11/01/2019   INSULIN 24.5 05/25/2019   INSULIN 21.9 02/02/2019   INSULIN 61.1 (H) 10/05/2018    3. Vitamin D deficiency Angela [name]'s Vitamin D is not at  goal (29 at last check). She is on weekly prescription Vitamin D.  Lab Results  Component Value Date   VD25OH 29.02 (L) 10/30/2020   VD25OH 39.9 11/01/2019   VD25OH 35.6 05/25/2019    Assessment/Plan:   1. Iron deficiency anemia, unspecified iron deficiency anemia type  We will check labs today. Continue iron supplementation.   - Vitamin B12 - Folate - Iron and TIBC - Ferritin - CBC with Differential/Platelet  2. Pre-diabetes Angela [name] will continue taking Wegovy 2.4 mg weekly. We will check labs today. She will continue to work on weight loss, exercise, and decreasing simple carbohydrates to help decrease the risk of diabetes.   - Hemoglobin A1c  - Insulin, random  3. Vitamin D deficiency Angela [name] agrees to continue to take prescription Vitamin D 50,000 IU every week and she will follow-up for routine testing of Vitamin D, at least 2-3 times per year to avoid over-replacement. We will check labs today.  - Comprehensive metabolic panel  - VITAMIN D 25 Hydroxy (Vit-D Deficiency, Fractures)  4. Obesity with current BMI of 27.9 Angela [name] is currently in the action stage of change. As such, her goal is to continue with weight loss efforts. She has agreed to the Category 4 Plan and keeping a food journal and adhering to recommended goals of 1700-1800 calories and 125 grams of protein daily.  Exercise goals: I encouraged Angela [name] to do resistance training. She currently has a free trial at  a gym but has not gone yet.   Behavioral modification strategies: planning for success.  Angela [name] has agreed to follow-up with our clinic in 3 weeks with Dr. Jearld Shines.  Objective:   Blood pressure 117/73, pulse 80, temperature 98.2 F (36.8 C), height 5\' 8"  (1.727 m), weight 183 lb (83 kg), SpO2 99 %. Body mass index is 27.83 kg/m.  General: Cooperative, alert, well developed, in no acute distress. HEENT: Conjunctivae and lids unremarkable. Cardiovascular: Regular rhythm.  Lungs: Normal work of  breathing. Neurologic: No focal deficits.   Lab Results  Component Value Date   CREATININE 0.70 10/30/2020   BUN 12 10/30/2020   NA 138 10/30/2020   K 4.1 10/30/2020   CL 105 10/30/2020   CO2 27 10/30/2020   Lab Results  Component Value Date   ALT 17 10/30/2020   AST 17 10/30/2020   ALKPHOS 53 10/30/2020   BILITOT 0.5 10/30/2020   Lab Results  Component Value Date   HGBA1C 5.6 10/30/2020   HGBA1C 5.5 11/01/2019   HGBA1C 5.5 05/25/2019   HGBA1C 5.5 02/02/2019   HGBA1C 5.7 (H) 10/05/2018   Lab Results  Component Value Date   INSULIN 24.4 11/01/2019   INSULIN 24.5 05/25/2019   INSULIN 21.9 02/02/2019   INSULIN 61.1 (H) 10/05/2018   Lab Results  Component Value Date   TSH 2.020 10/05/2018   Lab Results  Component Value Date   CHOL 144 10/30/2020   HDL 55.50 10/30/2020   LDLCALC 72 10/30/2020   TRIG 81.0 10/30/2020   CHOLHDL 3 10/30/2020   Lab Results  Component Value Date   VD25OH 29.02 (L) 10/30/2020   VD25OH 39.9 11/01/2019   VD25OH 35.6 05/25/2019   Lab Results  Component Value Date   WBC 4.7 10/30/2020   HGB 9.9 (L) 10/30/2020   HCT 29.8 (L) 10/30/2020   MCV 76.7 (L) 10/30/2020   PLT 229.0 10/30/2020   Lab Results  Component Value Date   IRON 38 02/06/2020   TIBC 359 02/06/2020   FERRITIN 26 02/06/2020   Attestation Statements:   Reviewed by clinician on day of visit: allergies, medications, problem list, medical history, surgical history, family history, social history, and previous encounter notes.  I, Lizbeth Bark, RMA, am acting as Location manager for [employer], [place].  I have reviewed the above documentation for accuracy and completeness, and I agree with the above. -  Georgianne Fick, FNP

## 2021-05-14 ENCOUNTER — Encounter (INDEPENDENT_AMBULATORY_CARE_PROVIDER_SITE_OTHER): Payer: Self-pay | Admitting: Family Medicine

## 2021-05-14 LAB — COMPREHENSIVE METABOLIC PANEL
ALT: 14 IU/L (ref 0–32)
AST: 18 IU/L (ref 0–40)
Albumin/Globulin Ratio: 1.5 (ref 1.2–2.2)
Albumin: 4.1 g/dL (ref 3.8–4.8)
Alkaline Phosphatase: 67 IU/L (ref 44–121)
BUN/Creatinine Ratio: 25 — ABNORMAL HIGH (ref 9–23)
BUN: 17 mg/dL (ref 6–24)
Bilirubin Total: 0.3 mg/dL (ref 0.0–1.2)
CO2: 20 mmol/L (ref 20–29)
Calcium: 9.2 mg/dL (ref 8.7–10.2)
Chloride: 106 mmol/L (ref 96–106)
Creatinine, Ser: 0.67 mg/dL (ref 0.57–1.00)
Globulin, Total: 2.7 g/dL (ref 1.5–4.5)
Glucose: 83 mg/dL (ref 70–99)
Potassium: 4.6 mmol/L (ref 3.5–5.2)
Sodium: 141 mmol/L (ref 134–144)
Total Protein: 6.8 g/dL (ref 6.0–8.5)
eGFR: 109 mL/min/{1.73_m2} (ref >59)

## 2021-05-14 LAB — CBC WITH DIFFERENTIAL/PLATELET
Basophils Absolute: 0 10*3/uL (ref 0.0–0.2)
Basos: 0 %
EOS (ABSOLUTE): 0.1 10*3/uL (ref 0.0–0.4)
Eos: 1 %
Hematocrit: 32.2 % — ABNORMAL LOW (ref 34.0–46.6)
Hemoglobin: 10 g/dL — ABNORMAL LOW (ref 11.1–15.9)
Immature Grans (Abs): 0 10*3/uL (ref 0.0–0.1)
Immature Granulocytes: 0 %
Lymphocytes Absolute: 1.6 10*3/uL (ref 0.7–3.1)
Lymphs: 32 %
MCH: 24.5 pg — ABNORMAL LOW (ref 26.6–33.0)
MCHC: 31.1 g/dL — ABNORMAL LOW (ref 31.5–35.7)
MCV: 79 fL (ref 79–97)
Monocytes Absolute: 0.3 10*3/uL (ref 0.1–0.9)
Monocytes: 7 %
Neutrophils Absolute: 3 10*3/uL (ref 1.4–7.0)
Neutrophils: 60 %
Platelets: 255 10*3/uL (ref 150–450)
RBC: 4.08 x10E6/uL (ref 3.77–5.28)
RDW: 16.2 % — ABNORMAL HIGH (ref 11.7–15.4)
WBC: 5 10*3/uL (ref 3.4–10.8)

## 2021-05-14 LAB — FERRITIN: Ferritin: 7 ng/mL — ABNORMAL LOW (ref 15–150)

## 2021-05-14 LAB — HEMOGLOBIN A1C
Est. average glucose Bld gHb Est-mCnc: 111 mg/dL
Hgb A1c MFr Bld: 5.5 % (ref 4.8–5.6)

## 2021-05-14 LAB — IRON AND TIBC
Iron Saturation: 7 % — CL (ref 15–55)
Iron: 29 ug/dL (ref 27–159)
Total Iron Binding Capacity: 397 ug/dL (ref 250–450)
UIBC: 368 ug/dL (ref 131–425)

## 2021-05-14 LAB — INSULIN, RANDOM: INSULIN: 18.9 u[IU]/mL (ref 2.6–24.9)

## 2021-05-14 LAB — FOLATE: Folate: 6 ng/mL (ref >3.0)

## 2021-05-14 LAB — VITAMIN B12: Vitamin B-12: 491 pg/mL (ref 232–1245)

## 2021-05-14 LAB — VITAMIN D 25 HYDROXY (VIT D DEFICIENCY, FRACTURES): Vit D, 25-Hydroxy: 91.7 ng/mL (ref 30.0–100.0)

## 2021-05-22 ENCOUNTER — Other Ambulatory Visit (HOSPITAL_COMMUNITY): Payer: Self-pay

## 2021-05-22 ENCOUNTER — Other Ambulatory Visit: Payer: Self-pay | Admitting: Gastroenterology

## 2021-05-22 ENCOUNTER — Other Ambulatory Visit: Payer: Self-pay

## 2021-05-22 DIAGNOSIS — K5909 Other constipation: Secondary | ICD-10-CM

## 2021-05-22 MED ORDER — LINACLOTIDE 145 MCG PO CAPS
ORAL_CAPSULE | ORAL | 0 refills | Status: DC
  Filled 2021-05-22: qty 30, 30d supply, fill #0
  Filled 2021-05-22: qty 90, 90d supply, fill #0

## 2021-05-22 NOTE — Progress Notes (Signed)
Patient made follow up appointment so refilled medication one time

## 2021-05-30 ENCOUNTER — Other Ambulatory Visit (HOSPITAL_COMMUNITY): Payer: Self-pay

## 2021-05-30 ENCOUNTER — Other Ambulatory Visit (HOSPITAL_BASED_OUTPATIENT_CLINIC_OR_DEPARTMENT_OTHER): Payer: Self-pay

## 2021-06-12 ENCOUNTER — Other Ambulatory Visit (HOSPITAL_COMMUNITY): Payer: Self-pay

## 2021-06-12 ENCOUNTER — Other Ambulatory Visit: Payer: Self-pay

## 2021-06-12 ENCOUNTER — Encounter (INDEPENDENT_AMBULATORY_CARE_PROVIDER_SITE_OTHER): Payer: Self-pay | Admitting: Family Medicine

## 2021-06-12 ENCOUNTER — Ambulatory Visit (INDEPENDENT_AMBULATORY_CARE_PROVIDER_SITE_OTHER): Payer: 59 | Admitting: Family Medicine

## 2021-06-12 VITALS — BP 126/83 | HR 81 | Temp 98.3°F | Ht 68.0 in | Wt 183.0 lb

## 2021-06-12 DIAGNOSIS — E669 Obesity, unspecified: Secondary | ICD-10-CM

## 2021-06-12 DIAGNOSIS — D508 Other iron deficiency anemias: Secondary | ICD-10-CM | POA: Diagnosis not present

## 2021-06-12 DIAGNOSIS — Z6827 Body mass index (BMI) 27.0-27.9, adult: Secondary | ICD-10-CM | POA: Diagnosis not present

## 2021-06-12 DIAGNOSIS — E559 Vitamin D deficiency, unspecified: Secondary | ICD-10-CM

## 2021-06-12 MED ORDER — FUSION PLUS PO CAPS
1.0000 | ORAL_CAPSULE | Freq: Every day | ORAL | 0 refills | Status: DC
  Filled 2021-06-12: qty 30, 30d supply, fill #0

## 2021-06-12 MED ORDER — SEMAGLUTIDE-WEIGHT MANAGEMENT 2.4 MG/0.75ML ~~LOC~~ SOAJ
SUBCUTANEOUS | 0 refills | Status: DC
  Filled 2021-06-12: qty 3, 28d supply, fill #0

## 2021-06-12 NOTE — Progress Notes (Signed)
Chief Complaint:   OBESITY Angela [name] is here to discuss her progress with her obesity treatment plan along with follow-up of her obesity related diagnoses. Angela [name] is on the Category 4 Plan and keeping a food journal and adhering to recommended goals of 1700-1800 calories and 125 grams protein and states she is following her eating plan approximately 65-70% of the time. Angela [name] states she is going to the gym 40-60 minutes 1-2 times per week.  Today's visit was #: 91 Starting weight: 220 lbs Starting date: 10/05/2018 Today's weight: 183 lbs Today's date: 06/12/2021 Total lbs lost to date: 37 Total lbs lost since last in-office visit: 0  Interim History: Pt is going to the gym twice a week at work. She recognizes the last few days she wasn't as on plan as she would have liked. She is doing more category 4 than journaling, due to ease. [place] in a few days.  Subjective:   1. Other iron deficiency anemia Pt is on fusion plus and taking it daily for at least 3 weeks. Hemoglobin 10, hematocrit 32, MCV 79, ferritin 7, and iron 29.  2. Vitamin D deficiency Pt's last Vit D level was 91. She is on Rx Vit D every other week.  Assessment/Plan:   1. Other iron deficiency anemia Orders and follow up as documented in patient record. Increase fusion plus to daily.  Counseling Iron is essential for our bodies to make red blood cells.  Reasons that someone may be deficient include: an iron-deficient diet (more likely in those following vegan or vegetarian diets), women with heavy menses, patients with GI disorders or poor absorption, patients that have had bariatric surgery, frequent blood donors, patients with cancer, and patients with heart disease. Iron-rich foods include dark leafy greens, red and white meats, eggs, seafood, and beans.   Certain foods and drinks prevent your body from absorbing iron properly. Avoid eating these foods in the same meal as  iron-rich foods or with iron supplements. These foods include: coffee, black tea, and red wine; milk, dairy products, and foods that are high in calcium; beans and soybeans; whole grains.  Constipation can be a side effect of iron supplementation. Increased water and fiber intake are helpful. Water goal: > 2 liters/day. Fiber goal: > 25 grams/day.  Increase & Refill- Iron-FA-B Cmp-C-Biot-Probiotic (FUSION PLUS) CAPS; Take 1 capsule by mouth daily.  Dispense: 90 capsule; Refill: 0  2. Vitamin D deficiency Low Vitamin D level contributes to fatigue and are associated with obesity, breast, and colon cancer. She agrees to change to OTC Vitamin D daily and will follow-up for routine testing of Vitamin D, at least 2-3 times per year to avoid over-replacement.  3. Obesity with current BMI of 27.9 Angela [name] is currently in the action stage of change. As such, her goal is to continue with weight loss efforts. She has agreed to the Category 4 Plan and keeping a food journal and adhering to recommended goals of 1700-1800 calories and 125+ grams protein.   Continue Semaglutide 2.4 mg as prescribed below. Refill- Semaglutide-Weight Management 2.4 MG/0.75ML SOAJ; INJECT 2.4 MG INTO THE SKIN ONCE A WEEK.  Dispense: 3 mL; Refill: 0  Exercise goals:  As is  Behavioral modification strategies: increasing lean protein intake, meal planning and cooking strategies, keeping healthy foods in the home, and planning for success.  Angela [name] has agreed to follow-up with our clinic in 5-6 weeks. She was informed of the importance  of frequent follow-up visits to maximize her success with intensive lifestyle modifications for her multiple health conditions.   Objective:   Blood pressure 126/83, pulse 81, temperature 98.3 F (36.8 C), height 5\' 8"  (1.727 m), weight 183 lb (83 kg), last menstrual period 06/01/2021, SpO2 99 %. Body mass index is 27.83 kg/m.  General: Cooperative, alert, well developed, in no acute  distress. HEENT: Conjunctivae and lids unremarkable. Cardiovascular: Regular rhythm.  Lungs: Normal work of breathing. Neurologic: No focal deficits.   Lab Results  Component Value Date   CREATININE 0.67 05/13/2021   BUN 17 05/13/2021   NA 141 05/13/2021   K 4.6 05/13/2021   CL 106 05/13/2021   CO2 20 05/13/2021   Lab Results  Component Value Date   ALT 14 05/13/2021   AST 18 05/13/2021   ALKPHOS 67 05/13/2021   BILITOT 0.3 05/13/2021   Lab Results  Component Value Date   HGBA1C 5.5 05/13/2021   HGBA1C 5.6 10/30/2020   HGBA1C 5.5 11/01/2019   HGBA1C 5.5 05/25/2019   HGBA1C 5.5 02/02/2019   Lab Results  Component Value Date   INSULIN 18.9 05/13/2021   INSULIN 24.4 11/01/2019   INSULIN 24.5 05/25/2019   INSULIN 21.9 02/02/2019   INSULIN 61.1 (H) 10/05/2018   Lab Results  Component Value Date   TSH 2.020 10/05/2018   Lab Results  Component Value Date   CHOL 144 10/30/2020   HDL 55.50 10/30/2020   LDLCALC 72 10/30/2020   TRIG 81.0 10/30/2020   CHOLHDL 3 10/30/2020   Lab Results  Component Value Date   VD25OH 91.7 05/13/2021   VD25OH 29.02 (L) 10/30/2020   VD25OH 39.9 11/01/2019   Lab Results  Component Value Date   WBC 5.0 05/13/2021   HGB 10.0 (L) 05/13/2021   HCT 32.2 (L) 05/13/2021   MCV 79 05/13/2021   PLT 255 05/13/2021   Lab Results  Component Value Date   IRON 29 05/13/2021   TIBC 397 05/13/2021   FERRITIN 7 (L) 05/13/2021    Attestation Statements:   Reviewed by clinician on day of visit: allergies, medications, problem list, medical history, surgical history, family history, social history, and previous encounter notes.  Coral Ceo, CMA, am acting as transcriptionist for Coralie Common, MD.   I have reviewed the above documentation for accuracy and completeness, and I agree with the above. - Coralie Common, MD

## 2021-06-13 ENCOUNTER — Other Ambulatory Visit (HOSPITAL_COMMUNITY): Payer: Self-pay

## 2021-06-23 ENCOUNTER — Other Ambulatory Visit (HOSPITAL_COMMUNITY): Payer: Self-pay

## 2021-06-23 DIAGNOSIS — H4031X3 Glaucoma secondary to eye trauma, right eye, severe stage: Secondary | ICD-10-CM | POA: Diagnosis not present

## 2021-06-23 MED ORDER — ATROPINE SULFATE 1 % OP SOLN
OPHTHALMIC | 11 refills | Status: DC
  Filled 2021-06-23: qty 5, 80d supply, fill #0

## 2021-06-23 MED ORDER — BRIMONIDINE TARTRATE-TIMOLOL 0.2-0.5 % OP SOLN
OPHTHALMIC | 11 refills | Status: DC
  Filled 2021-06-23: qty 10, 80d supply, fill #0

## 2021-06-24 ENCOUNTER — Other Ambulatory Visit: Payer: Self-pay

## 2021-06-24 ENCOUNTER — Encounter: Payer: Self-pay | Admitting: Gastroenterology

## 2021-06-24 ENCOUNTER — Ambulatory Visit (INDEPENDENT_AMBULATORY_CARE_PROVIDER_SITE_OTHER): Payer: 59 | Admitting: Gastroenterology

## 2021-06-24 ENCOUNTER — Other Ambulatory Visit (HOSPITAL_COMMUNITY): Payer: Self-pay

## 2021-06-24 DIAGNOSIS — K5909 Other constipation: Secondary | ICD-10-CM

## 2021-06-24 DIAGNOSIS — Z86010 Personal history of colonic polyps: Secondary | ICD-10-CM

## 2021-06-24 MED ORDER — NA SULFATE-K SULFATE-MG SULF 17.5-3.13-1.6 GM/177ML PO SOLN
354.0000 mL | Freq: Once | ORAL | 0 refills | Status: AC
  Filled 2021-06-24: qty 354, 1d supply, fill #0

## 2021-06-24 MED ORDER — LINACLOTIDE 145 MCG PO CAPS
ORAL_CAPSULE | ORAL | 3 refills | Status: DC
  Filled 2021-06-24: qty 90, 90d supply, fill #0
  Filled 2021-09-25: qty 90, 90d supply, fill #1
  Filled 2021-12-25: qty 90, 90d supply, fill #2
  Filled 2022-03-20: qty 90, 90d supply, fill #3

## 2021-06-24 NOTE — Progress Notes (Signed)
[name], MD [address]  [place], [place] [address]  Main: [phone]  Fax: [phone]    Gastroenterology Consultation  Referring Provider:     Pleas Koch, NP Primary Care Physician:  Pleas Koch, NP Primary Gastroenterologist:  Dr. [name] Reason for Consultation:   IDA, chronic constipation        HPI:   Angela [name] is a 46 y.o. female referred by Dr. Carlis Abbott, Leticia Penna, NP  for consultation & management of chronic constipation. Patient reports that she has been experiencing irregular bowel habits for last 2 years, reports having hard stools, frequency every 5 days followed by several bowel movements.  She has joined weight loss program with [place] and trying to eat healthy.  She is trying to incorporate more fiber in her diet.  She states MiraLAX is not working.  She takes Metamucil which does regulate her bowel movements however, she experiences right lower quadrant pain with it in 2 to 3 days after taking Metamucil.  She denies rectal bleeding.  She also has history of iron deficiency for which she was taking oral iron.  She stopped oral replacement due to worsening of constipation.  She is a Software engineer at [employer] She does not smoke or drink alcohol  Follow-up visit 06/05/2019 Patient has been seeing Dr. Leafy Ro for healthy weight and weight loss management.  She has done great with their meal management program and physical activity.  She reports that her constipation has significantly improved.  She continues to take Linzess 145 MCG daily.  She is taking fusion plus in her anemia has resolved.  Iron levels are improving.  She does not have any concerns today  Follow-up visit 02/06/2020 Patient contacted me in early August via MyChart due to worsening of anemia, hemoglobin 11.3.  Therefore, she underwent video capsule endoscopy which came back normal with no source of bleeding identified.  I started  her on fusion plus every other day.  She also reports to me today that her menstrual cycles are heavier than normal.  She has discussed with her GYN regarding possibilities of hysterectomy or hormone therapy, has not come to any consensus yet.  She does not have any GI symptoms today  Follow up visit 06/24/2021 Patient is here for follow-up of chronic constipation and to request refill on Linzess.  She reports that her constipation is well controlled.  She is taking Linzess 145 mcg about 3 days a week.  She does have recurrence of iron deficiency anemia.  She has appointment to see OB/GYN in next 2 to 3 months to discuss about hysterectomy.  She is currently taking fusion about twice daily.  She denies any other GI symptoms.  She is also due for her surveillance colonoscopy in July 2023  NSAIDs: None  Antiplts/Anticoagulants/Anti thrombotics: None  GI Procedures:  EGD and colonoscopy 10/27/2018 - Normal duodenal bulb and second portion of the duodenum. - A few gastric polyps. Resected and retrieved. - Normal gastroesophageal junction and esophagus.  - One 5 mm polyp in the cecum, removed with mucosal resection. Resected and retrieved. Clip was placed. - One 8 mm polyp in the ascending colon, removed with a hot snare. Resected and retrieved. Clip was placed. - One 5 mm polyp in the descending colon, removed with a cold snare. Resected and retrieved. - The distal rectum and anal verge are normal on retroflexion view. - The examination was otherwise normal. -  Examined portion of the terminal ileum was normal - Mucosal resection was performed. Resection and retrieval were complete.  DIAGNOSIS:  A.  STOMACH POLYP X2, FUNDUS; HOT SNARE:  - FUNDIC GLAND POLYP, 2 FRAGMENTS.  - NEGATIVE FOR DYSPLASIA AND MALIGNANCY.   B.  COLON POLYP, CECUM; HOT SNARE:  - SESSILE SERRATED POLYP.  - NEGATIVE FOR DYSPLASIA AND MALIGNANCY.   C.  COLON POLYP, ASCENDING; HOT SNARE:  - TUBULAR ADENOMA.  - NEGATIVE FOR  HIGH-GRADE DYSPLASIA AND MALIGNANCY.   D.  COLON POLYP, DESCENDING; COLD SNARE:  - TUBULAR ADENOMA.  - NEGATIVE FOR HIGH-GRADE DYSPLASIA AND MALIGNANCY.  Past Medical History:  Diagnosis Date   Allergy Enviromental   Anemia 08/2015   Asthma    as a child   Back pain gerd   Constipation    Dysuria 11/25/2018   Glaucoma    Heart murmur childhood   IBS (irritable bowel syndrome)    Joint pain    Menorrhagia    Palpitations    Plantar fasciitis    Prediabetes    Swelling    feet, legs   Torn meniscus    Right   Vaginal burning 11/25/2018    Past Surgical History:  Procedure Laterality Date   CHOLECYSTECTOMY  2005   COLONOSCOPY WITH PROPOFOL N/A 10/27/2018   Procedure: COLONOSCOPY WITH PROPOFOL;  Surgeon: Lin Landsman, MD;  Location: [facility] ENDOSCOPY;  Service: Gastroenterology;  Laterality: N/A;   DILATATION & CURETTAGE/HYSTEROSCOPY WITH MYOSURE N/A 06/23/2018   Procedure: DILATATION & CURETTAGE/HYSTEROSCOPY WITH MYOSURE;  Surgeon: Will Bonnet, MD;  Location: [facility] ORS;  Service: Gynecology;  Laterality: N/A;   DILATION AND CURETTAGE OF UTERUS     ERCP     3 times   ESOPHAGOGASTRODUODENOSCOPY (EGD) WITH PROPOFOL  10/27/2018   Procedure: ESOPHAGOGASTRODUODENOSCOPY (EGD) WITH PROPOFOL;  Surgeon: Lin Landsman, MD;  Location: [facility] ENDOSCOPY;  Service: Gastroenterology;;   EYE SURGERY     GIVENS CAPSULE STUDY N/A 12/11/2019   Procedure: GIVENS CAPSULE STUDY;  Surgeon: Lin Landsman, MD;  [facility];  Service: Gastroenterology;  Laterality: N/A;   MENISCUS REPAIR Right    x 2    Current Outpatient Medications:    atropine 1 % ophthalmic solution, Place 1 drop into the right eye daily., Disp: 5 mL, Rfl: 11   brimonidine-timolol (COMBIGAN) 0.2-0.5 % ophthalmic solution, Place 1 drop into the right eye every 12 hours., Disp: 10 mL, Rfl: 11   cetirizine (ZYRTEC) 10 MG tablet, Take 10 mg by mouth daily as needed for allergies., Disp: , Rfl:     ibuprofen (ADVIL,MOTRIN) 600 MG tablet, Take 1 tablet (600 mg total) by mouth every 6 (six) hours as needed for mild pain, moderate pain or cramping., Disp: 30 tablet, Rfl: 0   Iron-FA-B Cmp-C-Biot-Probiotic (FUSION PLUS) CAPS, Take 1 capsule by mouth daily., Disp: 90 capsule, Rfl: 0   Na Sulfate-K Sulfate-Mg Sulf 17.5-3.13-1.6 GM/177ML SOLN, Take by mouth once for 1 dose as directed, Disp: 354 mL, Rfl: 0   psyllium (METAMUCIL) 58.6 % packet, Take 1 packet by mouth daily., Disp: , Rfl:    linaclotide (LINZESS) 145 MCG CAPS capsule, TAKE 1 CAPSULE BY MOUTH ONCE DAILY BEFORE BREAKFAST, Disp: 90 capsule, Rfl: 3    Family History  Problem Relation Age of Onset   Colon cancer Mother 45   Diabetes Mother    Hypertension Mother    Arthritis Mother    Transient ischemic attack Mother    Liver disease Mother  Obesity Mother    Diabetes Father    Heart disease Father    Breast cancer Maternal Aunt        mat great aunt   Esophageal cancer Maternal Grandmother    Cancer Maternal Grandmother    Diabetes Maternal Grandmother    Alcohol abuse Maternal Grandfather      Social History   Tobacco Use   Smoking status: Never   Smokeless tobacco: Never  Vaping Use   Vaping Use: Never used  Substance Use Topics   Alcohol use: Yes    Alcohol/week: 2.0 standard drinks    Types: 2 Glasses of wine per week    Comment: occasional   Drug use: No    Allergies as of 06/24/2021 - Review Complete 06/24/2021  Allergen Reaction Noted   Quinoa-kale-hemp [alimentum]  10/05/2018    Review of Systems:    All systems reviewed and negative except where noted in HPI.   Physical Exam:  BP 103/69 (BP Location: Left Arm, Patient Position: Sitting, Cuff Size: Normal)    Pulse 99    Temp 98.6 F (37 C) (Oral)    Ht '5\' 8"'$  (1.727 m)    Wt 187 lb 8 oz (85 kg)    LMP 06/01/2021    BMI 28.51 kg/m  Patient's last menstrual period was 06/01/2021.  General:   Alert,  Well-developed, well-nourished, pleasant and  cooperative in NAD Head:  Normocephalic and atraumatic. Eyes:  Sclera clear, no icterus.   Conjunctiva pink. Ears:  Normal auditory acuity. Nose:  No deformity, discharge, or lesions. Mouth:  No deformity or lesions,oropharynx pink & moist. Neck:  Supple; no masses or thyromegaly. Lungs:  Respirations even and unlabored.  Clear throughout to auscultation.   No wheezes, crackles, or rhonchi. No acute distress. Heart:  Regular rate and rhythm; no murmurs, clicks, rubs, or gallops. Abdomen:  Normal bowel sounds. Soft, non-tender and non-distended without masses, hepatosplenomegaly or hernias noted.  No guarding or rebound tenderness.   Rectal: Not performed Msk:  Symmetrical without gross deformities. Good, equal movement & strength bilaterally. Pulses:  Normal pulses noted. Extremities:  No clubbing or edema.  No cyanosis. Neurologic:  Alert and oriented x3;  grossly normal neurologically. Skin:  Intact without significant lesions or rashes. No jaundice. Psych:  Alert and cooperative. Normal mood and affect.  Imaging Studies: Reviewed  Assessment and Plan:   Angela [name] is a 46 y.o. female with obesity, prediabetes on Metformin seen in consultation for iron deficiency anemia and chronic constipation.  Chronic constipation TSH is normal Continue high-fiber diet Continue Linzess 145 MCG daily, prescription provided  Iron deficiency anemia: Most likely secondary to menstrual blood loss EGD and colonoscopy are unremarkable, normal terminal ileum Video capsule endoscopy is unremarkable Continue fusion plus  Follow-up with OB/GYN regarding management of menorrhagia which is the source of iron deficiency anemia   Follow up as needed   [name], MD

## 2021-06-25 ENCOUNTER — Other Ambulatory Visit (HOSPITAL_COMMUNITY): Payer: Self-pay

## 2021-07-07 ENCOUNTER — Other Ambulatory Visit (INDEPENDENT_AMBULATORY_CARE_PROVIDER_SITE_OTHER): Payer: Self-pay | Admitting: Family Medicine

## 2021-07-07 ENCOUNTER — Other Ambulatory Visit (HOSPITAL_COMMUNITY): Payer: Self-pay

## 2021-07-07 DIAGNOSIS — E669 Obesity, unspecified: Secondary | ICD-10-CM

## 2021-07-09 ENCOUNTER — Other Ambulatory Visit (INDEPENDENT_AMBULATORY_CARE_PROVIDER_SITE_OTHER): Payer: Self-pay | Admitting: Family Medicine

## 2021-07-09 ENCOUNTER — Encounter (INDEPENDENT_AMBULATORY_CARE_PROVIDER_SITE_OTHER): Payer: Self-pay | Admitting: Family Medicine

## 2021-07-09 ENCOUNTER — Other Ambulatory Visit (HOSPITAL_COMMUNITY): Payer: Self-pay

## 2021-07-09 ENCOUNTER — Telehealth (INDEPENDENT_AMBULATORY_CARE_PROVIDER_SITE_OTHER): Payer: Self-pay | Admitting: Family Medicine

## 2021-07-09 DIAGNOSIS — E669 Obesity, unspecified: Secondary | ICD-10-CM

## 2021-07-09 MED ORDER — SEMAGLUTIDE-WEIGHT MANAGEMENT 2.4 MG/0.75ML ~~LOC~~ SOAJ
SUBCUTANEOUS | 0 refills | Status: DC
Start: 2021-07-09 — End: 2021-07-24
  Filled 2021-07-09: qty 3, 28d supply, fill #0

## 2021-07-09 NOTE — Telephone Encounter (Signed)
Patient called for refill on Wegovy. She thought a script was to be sent in February after last appointment. ?

## 2021-07-09 NOTE — Telephone Encounter (Signed)
Dr.Ukleja 

## 2021-07-09 NOTE — Telephone Encounter (Signed)
Please advise 

## 2021-07-24 ENCOUNTER — Ambulatory Visit (INDEPENDENT_AMBULATORY_CARE_PROVIDER_SITE_OTHER): Payer: 59 | Admitting: Family Medicine

## 2021-07-24 ENCOUNTER — Other Ambulatory Visit (HOSPITAL_COMMUNITY): Payer: Self-pay

## 2021-07-24 ENCOUNTER — Encounter (INDEPENDENT_AMBULATORY_CARE_PROVIDER_SITE_OTHER): Payer: Self-pay | Admitting: Family Medicine

## 2021-07-24 VITALS — BP 103/65 | HR 77 | Temp 98.0°F | Ht 68.0 in | Wt 185.0 lb

## 2021-07-24 DIAGNOSIS — D508 Other iron deficiency anemias: Secondary | ICD-10-CM | POA: Diagnosis not present

## 2021-07-24 DIAGNOSIS — E559 Vitamin D deficiency, unspecified: Secondary | ICD-10-CM | POA: Diagnosis not present

## 2021-07-24 DIAGNOSIS — E669 Obesity, unspecified: Secondary | ICD-10-CM

## 2021-07-24 DIAGNOSIS — R7303 Prediabetes: Secondary | ICD-10-CM | POA: Diagnosis not present

## 2021-07-24 DIAGNOSIS — Z6828 Body mass index (BMI) 28.0-28.9, adult: Secondary | ICD-10-CM

## 2021-07-24 DIAGNOSIS — Z9189 Other specified personal risk factors, not elsewhere classified: Secondary | ICD-10-CM

## 2021-07-24 MED ORDER — WEGOVY 1.7 MG/0.75ML ~~LOC~~ SOAJ
1.7000 mg | SUBCUTANEOUS | 0 refills | Status: DC
  Filled 2021-07-24 – 2021-08-08 (×2): qty 3, 28d supply, fill #0

## 2021-07-24 MED ORDER — VITAMIN D3 50 MCG (2000 UT) PO CAPS: 2000.0000 [IU] | ORAL_CAPSULE | Freq: Every day | ORAL | Status: AC

## 2021-07-25 ENCOUNTER — Other Ambulatory Visit (HOSPITAL_COMMUNITY): Payer: Self-pay

## 2021-07-25 LAB — CBC
Hemoglobin: 12.5 g/dL (ref 11.1–15.9)
MCH: 27.5 pg (ref 26.6–33.0)
MCHC: 31.6 g/dL (ref 31.5–35.7)
MCV: 87 fL (ref 79–97)
Platelets: 207 10*3/uL (ref 150–450)
RBC: 4.54 x10E6/uL (ref 3.77–5.28)
RDW: 17.9 % — ABNORMAL HIGH (ref 11.7–15.4)
WBC: 5.8 10*3/uL (ref 3.4–10.8)

## 2021-07-25 LAB — ANEMIA PANEL
Ferritin: 41 ng/mL (ref 15–150)
Folate, Hemolysate: 420 ng/mL
Folate, RBC: 1061 ng/mL (ref >498)
Hematocrit: 39.6 % (ref 34.0–46.6)
Iron Saturation: 18 % (ref 15–55)
Iron: 61 ug/dL (ref 27–159)
Retic Ct Pct: 1 % (ref 0.6–2.6)
Total Iron Binding Capacity: 341 ug/dL (ref 250–450)
UIBC: 280 ug/dL (ref 131–425)
Vitamin B-12: 681 pg/mL (ref 232–1245)

## 2021-07-28 NOTE — Progress Notes (Signed)
? ? ? ?Chief Complaint:  ? ?OBESITY ?Angela [name] is here to discuss her progress with her obesity treatment plan along with follow-up of her obesity related diagnoses. Angela [name] is on the Category 3 Plan, the Category 4 Plan, and keeping a food journal and adhering to recommended goals of 1700-1800 calories and 30-40 grams of protein and states she is following her eating plan approximately 70-80% of the time. Angela [name] states she is doing gym exercise for 30-40 minutes 2 times per week. ? ?Today's visit was #: 41 ?Starting weight: 220 lbs ?Starting date: 10/05/2018 ?Today's weight: 185 lbs ?Today's date: 07/24/2021 ?Total lbs lost to date: 35 lbs ?Total lbs lost since last in-office visit: 0 ? ?Interim History: Angela [name] had to go to [place] since her father was dying of prostate cancer. She did indulge while away (some out of comfort). She was doing well following the plan prior to that she has no plans until June when she is planning to go to [place]. She was without [facility] for a week but didn't feel much.  ? ?Subjective:  ? ?1. Other iron deficiency anemia ?Angela [name] finished iron capsules a few days ago. She notes fatigue.  ? ?2. Pre-diabetes ?Angela [name]'s A1C is now 5.5. Her insulin 18.9. ? ?3. Vitamin D deficiency ?Angela [name] is currently on Vitamin D3 2000 UT over the counter daily.  ? ?4. At risk for side effect of medication ?Angela [name] is at risk for side effect of medication due to decreased dose of Wegovy.  ? ?Assessment/Plan:  ? ?1. Other iron deficiency anemia ?Orders and follow up as documented in patient record. We will check labs today.  ? ?Counseling ?Iron is essential for our bodies to make red blood cells.  Reasons that someone may be deficient include: an iron-deficient diet (more likely in those following vegan or vegetarian diets), women with heavy menses, patients with GI disorders or poor absorption, patients that have had bariatric surgery, frequent blood donors, patients with cancer, and patients with heart  disease.   ?An iron supplement has been recommended. This is found over-the-counter.  ?Iron-rich foods include dark leafy greens, red and white meats, eggs, seafood, and beans.   ?Certain foods and drinks prevent your body from absorbing iron properly. Avoid eating these foods in the same meal as iron-rich foods or with iron supplements. These foods include: coffee, black tea, and red wine; milk, dairy products, and foods that are high in calcium; beans and soybeans; whole grains.  ?Constipation can be a side effect of iron supplementation. Increased water and fiber intake are helpful. Water goal: > 2 liters/day. Fiber goal: > 25 grams/day.  ?- CBC ?- Anemia panel ? ?2. Pre-diabetes ?We will follow up with labs in June. Angela [name] will continue to work on weight loss, exercise, and decreasing simple carbohydrates to help decrease the risk of diabetes.  ? ?3. Vitamin D deficiency ?Low Vitamin D level contributes to fatigue and are associated with obesity, breast, and colon cancer. Angela [name] agrees to continue to take prescription Vitamin D3 2,000 UT daily and she will follow-up for routine testing of Vitamin D, at least 2-3 times per year to avoid over-replacement. ? ?- Cholecalciferol (VITAMIN D3) 50 MCG (2000 UT) capsule; Take 1 capsule (2,000 Units total) by mouth daily. ? ?4. At risk for side effect of medication ?Angela [name] was given approximately 15 minutes of drug side effect counseling today.  We discussed side effect possibility and risk versus benefits. Angela [name] agreed to the medication and will contact this office if  these side effects are intolerable. ? ?Repetitive spaced learning was employed today to elicit superior memory formation and behavioral change.  ? ?5. Obesity with current BMI of 28.2 ?Angela [name] is currently in the action stage of change. As such, her goal is to continue with weight loss efforts. She has agreed to the Category 4 Plan.  ? ?Angela [name] will decrease Wegovy to 1.7 mg subcutaneous weekly for 1 month  with no refills.   ? ?- Semaglutide-Weight Management (WEGOVY) 1.7 MG/0.75ML SOAJ; Inject 1 pen (1.7 mg) into the skin once a week.  Dispense: 3 mL; Refill: 0 ? ?Exercise goals: All adults should avoid inactivity. Some physical activity is better than none, and adults who participate in any amount of physical activity gain some health benefits. ? ?Behavioral modification strategies: increasing lean protein intake, meal planning and cooking strategies, keeping healthy foods in the home, and planning for success. ? ?Angela [name] has agreed to follow-up with our clinic in 4-5 weeks. She was informed of the importance of frequent follow-up visits to maximize her success with intensive lifestyle modifications for her multiple health conditions.  ? ?Objective:  ? ?Blood pressure 103/65, pulse 77, temperature 98 ?F (36.7 ?C), height '5\' 8"'$  (1.727 m), weight 185 lb (83.9 kg), SpO2 100 %. ?Body mass index is 28.13 kg/m?. ? ?General: Cooperative, alert, well developed, in no acute distress. ?HEENT: Conjunctivae and lids unremarkable. ?Cardiovascular: Regular rhythm.  ?Lungs: Normal work of breathing. ?Neurologic: No focal deficits.  ? ?Lab Results  ?Component Value Date  ? CREATININE 0.67 05/13/2021  ? BUN 17 05/13/2021  ? NA 141 05/13/2021  ? K 4.6 05/13/2021  ? CL 106 05/13/2021  ? CO2 20 05/13/2021  ? ?Lab Results  ?Component Value Date  ? ALT 14 05/13/2021  ? AST 18 05/13/2021  ? ALKPHOS 67 05/13/2021  ? BILITOT 0.3 05/13/2021  ? ?Lab Results  ?Component Value Date  ? HGBA1C 5.5 05/13/2021  ? HGBA1C 5.6 10/30/2020  ? HGBA1C 5.5 11/01/2019  ? HGBA1C 5.5 05/25/2019  ? HGBA1C 5.5 02/02/2019  ? ?Lab Results  ?Component Value Date  ? INSULIN 18.9 05/13/2021  ? INSULIN 24.4 11/01/2019  ? INSULIN 24.5 05/25/2019  ? INSULIN 21.9 02/02/2019  ? INSULIN 61.1 (H) 10/05/2018  ? ?Lab Results  ?Component Value Date  ? TSH 2.020 10/05/2018  ? ?Lab Results  ?Component Value Date  ? CHOL 144 10/30/2020  ? HDL 55.50 10/30/2020  ? LDLCALC 72  10/30/2020  ? TRIG 81.0 10/30/2020  ? CHOLHDL 3 10/30/2020  ? ?Lab Results  ?Component Value Date  ? VD25OH 91.7 05/13/2021  ? VD25OH 29.02 (L) 10/30/2020  ? VD25OH 39.9 11/01/2019  ? ?Lab Results  ?Component Value Date  ? WBC 5.8 07/24/2021  ? HGB 12.5 07/24/2021  ? HCT 39.6 07/24/2021  ? MCV 87 07/24/2021  ? PLT 207 07/24/2021  ? ?Lab Results  ?Component Value Date  ? IRON 61 07/24/2021  ? TIBC 341 07/24/2021  ? FERRITIN 41 07/24/2021  ? ?Attestation Statements:  ? ?Reviewed by clinician on day of visit: allergies, medications, problem list, medical history, surgical history, family history, social history, and previous encounter notes. ? ?I, Lizbeth Bark, RMA, am acting as transcriptionist for Coralie Common, MD.  ? ?I have reviewed the above documentation for accuracy and completeness, and I agree with the above. Coralie Common, MD ? ?

## 2021-07-31 ENCOUNTER — Telehealth (INDEPENDENT_AMBULATORY_CARE_PROVIDER_SITE_OTHER): Payer: Self-pay | Admitting: Family Medicine

## 2021-07-31 ENCOUNTER — Encounter (INDEPENDENT_AMBULATORY_CARE_PROVIDER_SITE_OTHER): Payer: Self-pay

## 2021-07-31 NOTE — Telephone Encounter (Signed)
Prior authorization approved for [facility]. Effective: 07/31/2021 to 07/31/2022. Patient sent approval message via mychart.  ?

## 2021-08-08 ENCOUNTER — Other Ambulatory Visit (HOSPITAL_COMMUNITY): Payer: Self-pay

## 2021-08-11 ENCOUNTER — Other Ambulatory Visit (HOSPITAL_COMMUNITY): Payer: Self-pay

## 2021-08-19 ENCOUNTER — Ambulatory Visit (INDEPENDENT_AMBULATORY_CARE_PROVIDER_SITE_OTHER): Payer: 59 | Admitting: Family Medicine

## 2021-08-19 ENCOUNTER — Encounter (INDEPENDENT_AMBULATORY_CARE_PROVIDER_SITE_OTHER): Payer: Self-pay | Admitting: Family Medicine

## 2021-08-19 ENCOUNTER — Other Ambulatory Visit (HOSPITAL_COMMUNITY): Payer: Self-pay

## 2021-08-19 VITALS — BP 126/73 | HR 85 | Temp 97.8°F | Ht 68.0 in | Wt 183.0 lb

## 2021-08-19 DIAGNOSIS — R7303 Prediabetes: Secondary | ICD-10-CM

## 2021-08-19 DIAGNOSIS — E669 Obesity, unspecified: Secondary | ICD-10-CM | POA: Diagnosis not present

## 2021-08-19 DIAGNOSIS — Z6828 Body mass index (BMI) 28.0-28.9, adult: Secondary | ICD-10-CM

## 2021-08-19 DIAGNOSIS — D508 Other iron deficiency anemias: Secondary | ICD-10-CM | POA: Diagnosis not present

## 2021-08-19 DIAGNOSIS — Z9189 Other specified personal risk factors, not elsewhere classified: Secondary | ICD-10-CM

## 2021-08-19 MED ORDER — FUSION PLUS PO CAPS
1.0000 | ORAL_CAPSULE | Freq: Every day | ORAL | 0 refills | Status: DC
  Filled 2021-08-19: qty 90, 90d supply, fill #0

## 2021-08-19 MED ORDER — WEGOVY 1.7 MG/0.75ML ~~LOC~~ SOAJ
1.7000 mg | SUBCUTANEOUS | 0 refills | Status: DC
  Filled 2021-08-19 – 2021-09-01 (×2): qty 3, 28d supply, fill #0

## 2021-08-20 ENCOUNTER — Other Ambulatory Visit (HOSPITAL_COMMUNITY): Payer: Self-pay

## 2021-08-21 ENCOUNTER — Other Ambulatory Visit (HOSPITAL_COMMUNITY): Payer: Self-pay

## 2021-08-30 NOTE — Progress Notes (Signed)
Chief Complaint:   OBESITY Angela [name] is here to discuss her progress with her obesity treatment plan along with follow-up of her obesity related diagnoses. Angela [name] is on the Category 4 Plan and states she is following her eating plan approximately 80% of the time. Angela [name] states she is in the gym doing cardio and 20 minutes of resistance bands 10-20 minutes 2 times per week.  Today's visit was #: 17 Starting weight: 220 lbs Starting date: 10/05/2018 Today's weight: 183 lbs Today's date: 08/19/2021 Total lbs lost to date: 37 Total lbs lost since last in-office visit: 2  Interim History: Angela [name] is basically back to a normal routine. (She did a few 3rd shifts over the last few weeks)  Subjective:   1. Other iron deficiency anemia Angela [name] is on Fusion plus.  Angela [name] has significant improvement in ferritin and increase in iron.   2. Prediabetes Angela [name] last A1c was 5.5.  She is currently on GLP-1  3. At risk of diabetes mellitus Angela [name] is at higher than average risk for developing diabetes due to her obesity.   Assessment/Plan:   1. Other iron deficiency anemia Angela [name] has agreed to continue fusion plus, 1 capsule daily.  See below.   - Iron-FA-B Cmp-C-Biot-Probiotic (FUSION PLUS) CAPS; Take 1 capsule by mouth daily.  Dispense: 90 capsule; Refill: 0  2. Prediabetes Angela [name] has agreed to continue GLP-1, no change in dose.  3. At risk of diabetes mellitus Angela [name] was given approximately 15 minutes of diabetic education and counseling today. We discussed intensive lifestyle modifications today with an emphasis on weight loss as well as increasing exercise and decreasing simple carbohydrates in her diet. We also reviewed medication options with an emphasis on risk versus benefits of those discussed.  Repetitive spaced learning was employed today to elicit superior memory formation and behavioral change.   4. Obesity with current BMI of 28.2 Angela [name] has agreed to continue with Wegovy 1.7  mg SQ weekly.  See below.  - Semaglutide-Weight Management (WEGOVY) 1.7 MG/0.75ML SOAJ; Inject 1 pen (1.7 mg) into the skin once a week.  Dispense: 3 mL; Refill: 0  Angela [name] is currently in the action stage of change. As such, her goal is to continue with weight loss efforts. She has agreed to the Category 4 Plan.   Exercise goals: All adults should avoid inactivity. Some physical activity is better than none, and adults who participate in any amount of physical activity gain some health benefits.  Behavioral modification strategies: increasing lean protein intake, meal planning and cooking strategies, keeping healthy foods in the home, and planning for success.  Angela [name] has agreed to follow-up with our clinic in 7 weeks. She was informed of the importance of frequent follow-up visits to maximize her success with intensive lifestyle modifications for her multiple health conditions.   Objective:   Blood pressure 126/73, pulse 85, temperature 97.8 F (36.6 C), height '5\' 8"'$  (1.727 m), weight 183 lb (83 kg), SpO2 99 %. Body mass index is 27.83 kg/m.  General: Cooperative, alert, well developed, in no acute distress. HEENT: Conjunctivae and lids unremarkable. Cardiovascular: Regular rhythm.  Lungs: Normal work of breathing. Neurologic: No focal deficits.   Lab Results  Component Value Date   CREATININE 0.67 05/13/2021   BUN 17 05/13/2021   NA 141 05/13/2021   K 4.6 05/13/2021   CL 106 05/13/2021   CO2 20 05/13/2021   Lab Results  Component Value Date   ALT 14 05/13/2021   AST 18 05/13/2021  ALKPHOS 67 05/13/2021   BILITOT 0.3 05/13/2021   Lab Results  Component Value Date   HGBA1C 5.5 05/13/2021   HGBA1C 5.6 10/30/2020   HGBA1C 5.5 11/01/2019   HGBA1C 5.5 05/25/2019   HGBA1C 5.5 02/02/2019   Lab Results  Component Value Date   INSULIN 18.9 05/13/2021   INSULIN 24.4 11/01/2019   INSULIN 24.5 05/25/2019   INSULIN 21.9 02/02/2019   INSULIN 61.1 (H) 10/05/2018   Lab  Results  Component Value Date   TSH 2.020 10/05/2018   Lab Results  Component Value Date   CHOL 144 10/30/2020   HDL 55.50 10/30/2020   LDLCALC 72 10/30/2020   TRIG 81.0 10/30/2020   CHOLHDL 3 10/30/2020   Lab Results  Component Value Date   VD25OH 91.7 05/13/2021   VD25OH 29.02 (L) 10/30/2020   VD25OH 39.9 11/01/2019   Lab Results  Component Value Date   WBC 5.8 07/24/2021   HGB 12.5 07/24/2021   HCT 39.6 07/24/2021   MCV 87 07/24/2021   PLT 207 07/24/2021   Lab Results  Component Value Date   IRON 61 07/24/2021   TIBC 341 07/24/2021   FERRITIN 41 07/24/2021    Attestation Statements:   Reviewed by clinician on day of visit: allergies, medications, problem list, medical history, surgical history, family history, social history, and previous encounter notes.  I, Davy Pique, RMA, am acting as transcriptionist for Coralie Common, MD. I have reviewed the above documentation for accuracy and completeness, and I agree with the above. - Coralie Common, MD

## 2021-09-01 ENCOUNTER — Other Ambulatory Visit (HOSPITAL_COMMUNITY): Payer: Self-pay

## 2021-09-25 ENCOUNTER — Other Ambulatory Visit (HOSPITAL_COMMUNITY): Payer: Self-pay

## 2021-09-25 ENCOUNTER — Other Ambulatory Visit (INDEPENDENT_AMBULATORY_CARE_PROVIDER_SITE_OTHER): Payer: Self-pay | Admitting: Family Medicine

## 2021-09-25 DIAGNOSIS — E669 Obesity, unspecified: Secondary | ICD-10-CM

## 2021-09-26 ENCOUNTER — Other Ambulatory Visit (HOSPITAL_COMMUNITY): Payer: Self-pay

## 2021-09-30 ENCOUNTER — Other Ambulatory Visit (HOSPITAL_COMMUNITY): Payer: Self-pay

## 2021-10-01 ENCOUNTER — Other Ambulatory Visit (HOSPITAL_COMMUNITY): Payer: Self-pay

## 2021-10-01 ENCOUNTER — Other Ambulatory Visit (INDEPENDENT_AMBULATORY_CARE_PROVIDER_SITE_OTHER): Payer: Self-pay

## 2021-10-01 DIAGNOSIS — E669 Obesity, unspecified: Secondary | ICD-10-CM

## 2021-10-01 NOTE — Telephone Encounter (Signed)
Patient called in states she is out of her [facility] patient would like it sent to  [place] Phone:  [phone]  Fax:  [phone]     Patient is scheduled on 10/06/21 with Dr. Jeani Sow.

## 2021-10-01 NOTE — Telephone Encounter (Signed)
LAST APPOINTMENT DATE: 08/19/2021 NEXT APPOINTMENT DATE: 10/06/2021   [place] [address] Phone: [phone] Fax: [phone]  [facility] [place] [address] Phone: [phone] Fax: [phone]  Patient is requesting a refill of the following medications: Requested Prescriptions   Pending Prescriptions Disp Refills   Semaglutide-Weight Management (WEGOVY) 1.7 MG/0.75ML SOAJ 3 mL 0    Sig: Inject 1 pen (1.7 mg) into the skin once a week.    Date last filled: 08/19/2021 Previously prescribed by DR Jearld Shines  Lab Results  Component Value Date   HGBA1C 5.5 05/13/2021   HGBA1C 5.6 10/30/2020   HGBA1C 5.5 11/01/2019   Lab Results  Component Value Date   LDLCALC 72 10/30/2020   CREATININE 0.67 05/13/2021   Lab Results  Component Value Date   VD25OH 91.7 05/13/2021   VD25OH 29.02 (L) 10/30/2020   VD25OH 39.9 11/01/2019    BP Readings from Last 3 Encounters:  08/19/21 126/73  07/24/21 103/65  06/24/21 103/69

## 2021-10-01 NOTE — Addendum Note (Signed)
Addended by: Neomia Dear on: 10/01/2021 07:19 PM   Modules accepted: Orders

## 2021-10-06 ENCOUNTER — Ambulatory Visit (INDEPENDENT_AMBULATORY_CARE_PROVIDER_SITE_OTHER): Payer: 59 | Admitting: Family Medicine

## 2021-10-06 ENCOUNTER — Other Ambulatory Visit (HOSPITAL_COMMUNITY): Payer: Self-pay

## 2021-10-06 ENCOUNTER — Encounter (INDEPENDENT_AMBULATORY_CARE_PROVIDER_SITE_OTHER): Payer: Self-pay | Admitting: Family Medicine

## 2021-10-06 VITALS — BP 104/67 | HR 88 | Temp 98.1°F | Ht 68.0 in | Wt 185.0 lb

## 2021-10-06 DIAGNOSIS — Z6828 Body mass index (BMI) 28.0-28.9, adult: Secondary | ICD-10-CM | POA: Diagnosis not present

## 2021-10-06 DIAGNOSIS — D508 Other iron deficiency anemias: Secondary | ICD-10-CM | POA: Diagnosis not present

## 2021-10-06 DIAGNOSIS — E559 Vitamin D deficiency, unspecified: Secondary | ICD-10-CM

## 2021-10-06 DIAGNOSIS — E669 Obesity, unspecified: Secondary | ICD-10-CM | POA: Diagnosis not present

## 2021-10-06 MED ORDER — WEGOVY 1.7 MG/0.75ML ~~LOC~~ SOAJ
1.7000 mg | SUBCUTANEOUS | 0 refills | Status: DC
  Filled 2021-10-06: qty 9, 84d supply, fill #0

## 2021-10-08 NOTE — Progress Notes (Unsigned)
Chief Complaint:   OBESITY Angela [name] is here to discuss her progress with her obesity treatment plan along with follow-up of her obesity related diagnoses. Angela [name] is on the Category 3 Plan and the Category 4 Plan and states she is following her eating plan approximately 70% of the time. Angela [name] states she is walking 20 minutes 3 times per week.  Today's visit was #: 71 Starting weight: 220 lbs Starting date: 10/05/2018 Today's weight: 185 lbs Today's date: 10/06/2021 Total lbs lost to date: 35 lbs Total lbs lost since last in-office visit: 0  Interim History: Angela [name] drove up to [place] for [employer] Meal plan has been ok for breakfast and lunch, supper has been more of an issue. She notices that when she eats off the plan for numerous days in a row she tends to feel poorly. She has not bee exercising much.  Subjective:   1. Vitamin D deficiency Angela [name]'s last Vit D level of 91.7. Denies any nausea, vomiting or muscle weakness. She notes fatigue.  2. Other iron deficiency anemia Angela [name]'s last Anemia panel and CBC was within normal limits. She is on Fusion plus.  Assessment/Plan:   1. Vitamin D deficiency Angela [name] will continue taking over the counter Vit D. Need repeat labs Aug/Sept.  2. Other iron deficiency anemia Angela [name] will follow up labs in 2-3 months.  3. Obesity with current BMI of 28.2 We will refill Wegovy 1.7 mg subcutaneous once weekly for 1 month with 0 refills.  -Refill Semaglutide-Weight Management (WEGOVY) 1.7 MG/0.75ML SOAJ; Inject 1 pen (1.7 mg) into the skin once a week.  Dispense: 9 mL; Refill: 0  Angela [name] is currently in the action stage of change. As such, her goal is to continue with weight loss efforts. She has agreed to the Category 4 Plan.   Exercise goals: All adults should avoid inactivity. Some physical activity is better than none, and adults who participate in any amount of physical activity gain some health benefits.  Behavioral modification  strategies: increasing lean protein intake, meal planning and cooking strategies, keeping healthy foods in the home, travel eating strategies, and planning for success.  Angela [name] has agreed to follow-up with our clinic in 8 weeks. She was informed of the importance of frequent follow-up visits to maximize her success with intensive lifestyle modifications for her multiple health conditions.   Objective:   Blood pressure 104/67, pulse 88, temperature 98.1 F (36.7 C), height '5\' 8"'$  (1.727 m), weight 185 lb (83.9 kg), SpO2 100 %. Body mass index is 28.13 kg/m.  General: Cooperative, alert, well developed, in no acute distress. HEENT: Conjunctivae and lids unremarkable. Cardiovascular: Regular rhythm.  Lungs: Normal work of breathing. Neurologic: No focal deficits.   Lab Results  Component Value Date   CREATININE 0.67 05/13/2021   BUN 17 05/13/2021   NA 141 05/13/2021   K 4.6 05/13/2021   CL 106 05/13/2021   CO2 20 05/13/2021   Lab Results  Component Value Date   ALT 14 05/13/2021   AST 18 05/13/2021   ALKPHOS 67 05/13/2021   BILITOT 0.3 05/13/2021   Lab Results  Component Value Date   HGBA1C 5.5 05/13/2021   HGBA1C 5.6 10/30/2020   HGBA1C 5.5 11/01/2019   HGBA1C 5.5 05/25/2019   HGBA1C 5.5 02/02/2019   Lab Results  Component Value Date   INSULIN 18.9 05/13/2021   INSULIN 24.4 11/01/2019   INSULIN 24.5 05/25/2019   INSULIN 21.9 02/02/2019   INSULIN 61.1 (H) 10/05/2018   Lab  Results  Component Value Date   TSH 2.020 10/05/2018   Lab Results  Component Value Date   CHOL 144 10/30/2020   HDL 55.50 10/30/2020   LDLCALC 72 10/30/2020   TRIG 81.0 10/30/2020   CHOLHDL 3 10/30/2020   Lab Results  Component Value Date   VD25OH 91.7 05/13/2021   VD25OH 29.02 (L) 10/30/2020   VD25OH 39.9 11/01/2019   Lab Results  Component Value Date   WBC 5.8 07/24/2021   HGB 12.5 07/24/2021   HCT 39.6 07/24/2021   MCV 87 07/24/2021   PLT 207 07/24/2021   Lab Results   Component Value Date   IRON 61 07/24/2021   TIBC 341 07/24/2021   FERRITIN 41 07/24/2021   Attestation Statements:   Reviewed by clinician on day of visit: allergies, medications, problem list, medical history, surgical history, family history, social history, and previous encounter notes.  I, Elnora Morrison, RMA am acting as transcriptionist for Coralie Common, MD.  I have reviewed the above documentation for accuracy and completeness, and I agree with the above. -  ***

## 2021-10-29 ENCOUNTER — Other Ambulatory Visit: Payer: Self-pay | Admitting: Primary Care

## 2021-10-29 DIAGNOSIS — Z1231 Encounter for screening mammogram for malignant neoplasm of breast: Secondary | ICD-10-CM

## 2021-10-30 ENCOUNTER — Telehealth: Payer: Self-pay | Admitting: Family Medicine

## 2021-10-30 NOTE — Telephone Encounter (Signed)
Left message for patient to call office back to make appointment for her annual per triage line.

## 2021-10-31 ENCOUNTER — Ambulatory Visit
Admission: RE | Admit: 2021-10-31 | Discharge: 2021-10-31 | Disposition: A | Discharge status: Home / Self Care | Payer: 59 | Attending: Gastroenterology | Admitting: Gastroenterology

## 2021-10-31 ENCOUNTER — Ambulatory Visit: Payer: 59 | Admitting: Anesthesiology

## 2021-10-31 ENCOUNTER — Other Ambulatory Visit: Payer: Self-pay

## 2021-10-31 ENCOUNTER — Encounter: Payer: Self-pay | Admitting: Gastroenterology

## 2021-10-31 ENCOUNTER — Encounter
Admission: RE | Disposition: A | Discharge status: Home / Self Care | Payer: Self-pay | Source: Home / Self Care | Attending: Gastroenterology

## 2021-10-31 DIAGNOSIS — Z860100 Personal history of colon polyps, unspecified: Secondary | ICD-10-CM

## 2021-10-31 DIAGNOSIS — Z1211 Encounter for screening for malignant neoplasm of colon: Secondary | ICD-10-CM | POA: Insufficient documentation

## 2021-10-31 DIAGNOSIS — R7303 Prediabetes: Secondary | ICD-10-CM | POA: Diagnosis not present

## 2021-10-31 HISTORY — PX: COLONOSCOPY WITH PROPOFOL: SHX5780

## 2021-10-31 LAB — POCT PREGNANCY, URINE: Preg Test, Ur: NEGATIVE

## 2021-10-31 SURGERY — COLONOSCOPY WITH PROPOFOL
Anesthesia: General

## 2021-10-31 MED ORDER — GLYCOPYRROLATE 0.2 MG/ML IJ SOLN
INTRAMUSCULAR | Status: AC
  Filled 2021-10-31: qty 1

## 2021-10-31 MED ORDER — SODIUM CHLORIDE 0.9 % IV SOLN: INTRAVENOUS | Status: DC

## 2021-10-31 MED ORDER — PROPOFOL 10 MG/ML IV BOLUS
INTRAVENOUS | Status: DC | PRN
  Administered 2021-10-31 (×2): 30 mg via INTRAVENOUS
  Administered 2021-10-31: 80 mg via INTRAVENOUS

## 2021-10-31 MED ORDER — LIDOCAINE HCL (PF) 2 % IJ SOLN
INTRAMUSCULAR | Status: AC
  Filled 2021-10-31: qty 20

## 2021-10-31 MED ORDER — PROPOFOL 500 MG/50ML IV EMUL
INTRAVENOUS | Status: DC | PRN
  Administered 2021-10-31: 150 ug/kg/min via INTRAVENOUS

## 2021-10-31 MED ORDER — LIDOCAINE HCL (CARDIAC) PF 100 MG/5ML IV SOSY
PREFILLED_SYRINGE | INTRAVENOUS | Status: DC | PRN
  Administered 2021-10-31: 60 mg via INTRAVENOUS

## 2021-10-31 MED ORDER — DEXMEDETOMIDINE HCL IN NACL 80 MCG/20ML IV SOLN
INTRAVENOUS | Status: AC
  Filled 2021-10-31: qty 20

## 2021-10-31 MED ORDER — PHENYLEPHRINE 80 MCG/ML (10ML) SYRINGE FOR IV PUSH (FOR BLOOD PRESSURE SUPPORT)
PREFILLED_SYRINGE | INTRAVENOUS | Status: AC
  Filled 2021-10-31: qty 10

## 2021-10-31 NOTE — Anesthesia Preprocedure Evaluation (Addendum)
Anesthesia Evaluation  Patient identified by MRN, date of birth, ID band Patient awake    Reviewed: Allergy & Precautions, NPO status , Patient's Chart, lab work & pertinent test results  History of Anesthesia Complications Negative for: history of anesthetic complications  Airway Mallampati: I   Neck ROM: Full    Dental  (+) Implants Crowns :   Pulmonary asthma (childhood) ,    Pulmonary exam normal breath sounds clear to auscultation       Cardiovascular Exercise Tolerance: Good negative cardio ROS Normal cardiovascular exam Rhythm:Regular Rate:Normal     Neuro/Psych negative neurological ROS     GI/Hepatic negative GI ROS,   Endo/Other  Prediabetes   Renal/GU negative Renal ROS     Musculoskeletal   Abdominal   Peds  Hematology  (+) Blood dyscrasia, anemia ,   Anesthesia Other Findings   Reproductive/Obstetrics                            Anesthesia Physical Anesthesia Plan  ASA: 2  Anesthesia Plan: General   Post-op Pain Management:    Induction: Intravenous  PONV Risk Score and Plan: 3 and Propofol infusion, TIVA and Treatment may vary due to age or medical condition  Airway Management Planned: Natural Airway  Additional Equipment:   Intra-op Plan:   Post-operative Plan:   Informed Consent: I have reviewed the patients History and Physical, chart, labs and discussed the procedure including the risks, benefits and alternatives for the proposed anesthesia with the patient or authorized representative who has indicated his/her understanding and acceptance.       Plan Discussed with: CRNA  Anesthesia Plan Comments: (LMA/GETA backup discussed.  Patient consented for risks of anesthesia including but not limited to:  - adverse reactions to medications - damage to eyes, teeth, lips or other oral mucosa - nerve damage due to positioning  - sore throat or hoarseness -  damage to heart, brain, nerves, lungs, other parts of body or loss of life  Informed patient about role of CRNA in peri- and intra-operative care.  Patient voiced understanding.)        Anesthesia Quick Evaluation

## 2021-10-31 NOTE — Transfer of Care (Signed)
Immediate Anesthesia Transfer of Care Note  Patient: Angela [name]  Procedure(s) Performed: COLONOSCOPY WITH PROPOFOL  Patient Location: PACU  Anesthesia Type:General  Level of Consciousness: awake, alert  and oriented  Airway & Oxygen Therapy: Patient Spontanous Breathing  Post-op Assessment: Report given to RN and Post -op Vital signs reviewed and stable  Post vital signs: Reviewed and stable  Last Vitals:  Vitals Value Taken Time  BP    Temp    Pulse    Resp    SpO2      Last Pain:  Vitals:   10/31/21 0659  TempSrc: Temporal  PainSc: 0-No pain         Complications: No notable events documented.

## 2021-10-31 NOTE — Anesthesia Postprocedure Evaluation (Signed)
Anesthesia Post Note  Patient: Angela [name]  Procedure(s) Performed: COLONOSCOPY WITH PROPOFOL  Patient location during evaluation: PACU Anesthesia Type: General Level of consciousness: awake and alert, oriented and patient cooperative Pain management: pain level controlled Vital Signs Assessment: post-procedure vital signs reviewed and stable Respiratory status: spontaneous breathing, nonlabored ventilation and respiratory function stable Cardiovascular status: blood pressure returned to baseline and stable Postop Assessment: adequate PO intake Anesthetic complications: no   No notable events documented.   Last Vitals:  Vitals:   10/31/21 0823 10/31/21 0833  BP: 113/63 107/65  Pulse: 84 77  Resp: 19 (!) 38  Temp:    SpO2: 100% 100%    Last Pain:  Vitals:   10/31/21 0833  TempSrc:   PainSc: 0-No pain                 Darrin Nipper

## 2021-10-31 NOTE — H&P (Signed)
Angela Darby, MD [address]  [place], [place] [address]  Main: [phone]  Fax: [phone] Pager: [phone]  Primary Care Physician:  Pleas Koch, NP Primary Gastroenterologist:  Dr. Cephas [name]  Pre-Procedure History & Physical: HPI:  Angela [name] is a 46 y.o. female is here for an colonoscopy.   Past Medical History:  Diagnosis Date   Allergy Enviromental   Anemia 08/2015   Asthma    as a child   Back pain gerd   Constipation    Dysuria 11/25/2018   Glaucoma    Heart murmur childhood   IBS (irritable bowel syndrome)    Joint pain    Menorrhagia    Palpitations    Plantar fasciitis    Prediabetes    Swelling    feet, legs   Torn meniscus    Right   Vaginal burning 11/25/2018   Voice hoarseness 07/04/2018    Past Surgical History:  Procedure Laterality Date   CHOLECYSTECTOMY  2005   COLONOSCOPY WITH PROPOFOL N/A 10/27/2018   Procedure: COLONOSCOPY WITH PROPOFOL;  Surgeon: Lin Landsman, MD;  Location: [facility] ENDOSCOPY;  Service: Gastroenterology;  Laterality: N/A;   DILATATION & CURETTAGE/HYSTEROSCOPY WITH MYOSURE N/A 06/23/2018   Procedure: DILATATION & CURETTAGE/HYSTEROSCOPY WITH MYOSURE;  Surgeon: Will Bonnet, MD;  Location: [facility] ORS;  Service: Gynecology;  Laterality: N/A;   DILATION AND CURETTAGE OF UTERUS     ERCP     3 times   ESOPHAGOGASTRODUODENOSCOPY (EGD) WITH PROPOFOL  10/27/2018   Procedure: ESOPHAGOGASTRODUODENOSCOPY (EGD) WITH PROPOFOL;  Surgeon: Lin Landsman, MD;  Location: [facility] ENDOSCOPY;  Service: Gastroenterology;;   EYE SURGERY     GIVENS CAPSULE STUDY N/A 12/11/2019   Procedure: GIVENS CAPSULE STUDY;  Surgeon: Lin Landsman, MD;  Location: [facility] ENDOSCOPY;  Service: Gastroenterology;  Laterality: N/A;   MENISCUS REPAIR Right    x 2    Prior to Admission medications   Medication Sig Start Date End Date Taking? Authorizing Provider  atropine 1 % ophthalmic solution Place 1  drop into the right eye daily. 03/10/21  Yes   brimonidine-timolol (COMBIGAN) 0.2-0.5 % ophthalmic solution Place 1 drop into the right eye every 12 hours. 03/10/21  Yes   cetirizine (ZYRTEC) 10 MG tablet Take 10 mg by mouth daily as needed for allergies.   Yes [provider]  Cholecalciferol (VITAMIN D3) 50 MCG (2000 UT) capsule Take 1 capsule (2,000 Units total) by mouth daily. 07/24/21  Yes Laqueta Linden, MD  Iron-FA-B Cmp-C-Biot-Probiotic (FUSION PLUS) CAPS Take 1 capsule by mouth daily. 08/19/21  Yes Laqueta Linden, MD  linaclotide [facility]) 145 MCG CAPS capsule TAKE 1 CAPSULE BY MOUTH ONCE DAILY BEFORE BREAKFAST 06/24/21 06/24/22 Yes Saharra Santo, Tally Due, MD  ibuprofen (ADVIL,MOTRIN) 600 MG tablet Take 1 tablet (600 mg total) by mouth every 6 (six) hours as needed for mild pain, moderate pain or cramping. 06/23/18   Will Bonnet, MD  psyllium (METAMUCIL) 58.6 % packet Take 1 packet by mouth daily.    [provider]  Semaglutide-Weight Management (WEGOVY) 1.7 MG/0.75ML SOAJ Inject 1 pen (1.7 mg) into the skin once a week. 10/06/21   Laqueta Linden, MD    Allergies as of 06/25/2021 - Review Complete 06/24/2021  Allergen Reaction Noted   Quinoa-kale-hemp [alimentum]  10/05/2018    Family History  Problem Relation Age of Onset   Colon cancer Mother 75   Diabetes Mother    Hypertension Mother  Arthritis Mother    Transient ischemic attack Mother    Liver disease Mother    Obesity Mother    Diabetes Father    Heart disease Father    Breast cancer Maternal Aunt        mat great aunt   Esophageal cancer Maternal Grandmother    Cancer Maternal Grandmother    Diabetes Maternal Grandmother    Alcohol abuse Maternal Grandfather     Social History   Socioeconomic History   Marital status: Married    Spouse name: Financial planner   Number of children: 0   Years of education: Not on file   Highest education level: Not on file  Occupational History    Occupation: Agricultural consultant: [employer]  Tobacco Use   Smoking status: Never   Smokeless tobacco: Never  Vaping Use   Vaping Use: Never used  Substance and Sexual Activity   Alcohol use: Yes    Alcohol/week: 2.0 standard drinks of alcohol    Types: 2 Glasses of wine per week    Comment: occasional   Drug use: No   Sexual activity: Yes    Birth control/protection: None  Other Topics Concern   Not on file  Social History Narrative   Married.   No children.   Works in the [employer].   Enjoys watching movies, walking, reading.    Social Determinants of Health   Financial Resource Strain: Not on file  Food Insecurity: Not on file  Transportation Needs: Not on file  Physical Activity: Unknown (04/29/2018)   Exercise Vital Sign    Days of Exercise per Week: 0 days    Minutes of Exercise per Session: Not on file  Stress: Not on file  Social Connections: Not on file  Intimate Partner Violence: Not on file    Review of Systems: See HPI, otherwise negative ROS  Physical Exam: BP 122/83   Pulse 84   Temp (!) 96.5 F (35.8 C) (Temporal)   Resp 20   Ht '5\' 8"'$  (1.727 m)   Wt 84.4 kg   SpO2 100%   BMI 28.28 kg/m  General:   Alert,  pleasant and cooperative in NAD Head:  Normocephalic and atraumatic. Neck:  Supple; no masses or thyromegaly. Lungs:  Clear throughout to auscultation.    Heart:  Regular rate and rhythm. Abdomen:  Soft, nontender and nondistended. Normal bowel sounds, without guarding, and without rebound.   Neurologic:  Alert and  oriented x4;  grossly normal neurologically.  Impression/Plan: Angela [name] is here for an colonoscopy to be performed for h/o colon adenomas  Risks, benefits, limitations, and alternatives regarding  colonoscopy have been reviewed with the patient.  Questions have been answered.  All parties agreeable.   Sherri Sear, MD  10/31/2021, 7:47 AM

## 2021-10-31 NOTE — Op Note (Signed)
[facility] Patient Name: Angela [name] Procedure Date: 10/31/2021 7:10 AM MRN: [id number] Account #: [id number] Date of Birth: 05/07/1975 Admit Type: Outpatient Age: 46 Room: [facility] ENDO ROOM 2 Gender: Female Note Status: Finalized Instrument Name: Jasper Riling [id number] Procedure:             Colonoscopy Indications:           Surveillance: Personal history of adenomatous polyps                         on last colonoscopy 3 years ago, Last colonoscopy:                         July 2020 Providers:             Lin Landsman MD, MD Referring MD:          Pleas Koch (Referring MD) Medicines:             General Anesthesia Complications:         No immediate complications. Estimated blood loss: None. Procedure:             Pre-Anesthesia Assessment:                        - Prior to the procedure, a History and Physical was                         performed, and patient medications and allergies were                         reviewed. The patient is competent. The risks and                         benefits of the procedure and the sedation options and                         risks were discussed with the patient. All questions                         were answered and informed consent was obtained.                         Patient identification and proposed procedure were                         verified by the physician, the nurse, the                         anesthesiologist, the anesthetist and the technician                         in the pre-procedure area in the procedure room in the                         endoscopy suite. Mental Status Examination: alert and                         oriented. Airway Examination: normal oropharyngeal  airway and neck mobility. Respiratory Examination:                         clear to auscultation. CV Examination: normal.                         Prophylactic Antibiotics:  The patient does not require                         prophylactic antibiotics. Prior Anticoagulants: The                         patient has taken no previous anticoagulant or                         antiplatelet agents. ASA Grade Assessment: II - A                         patient with mild systemic disease. After reviewing                         the risks and benefits, the patient was deemed in                         satisfactory condition to undergo the procedure. The                         anesthesia plan was to use general anesthesia.                         Immediately prior to administration of medications,                         the patient was re-assessed for adequacy to receive                         sedatives. The heart rate, respiratory rate, oxygen                         saturations, blood pressure, adequacy of pulmonary                         ventilation, and response to care were monitored                         throughout the procedure. The physical status of the                         patient was re-assessed after the procedure.                        After obtaining informed consent, the colonoscope was                         passed under direct vision. Throughout the procedure,                         the patient's blood pressure, pulse, and oxygen  saturations were monitored continuously. The                         Colonoscope was introduced through the anus and                         advanced to the the terminal ileum, with                         identification of the appendiceal orifice and IC                         valve. The colonoscopy was performed without                         difficulty. The patient tolerated the procedure well.                         The quality of the bowel preparation was evaluated                         using the BBPS [facility] Bowel Preparation Scale) with                         scores of: Right Colon =  3, Transverse Colon = 3 and                         Left Colon = 3 (entire mucosa seen well with no                         residual staining, small fragments of stool or opaque                         liquid). The total BBPS score equals 9. Findings:      The perianal and digital rectal examinations were normal. Pertinent       negatives include normal sphincter tone and no palpable rectal lesions.      The terminal ileum appeared normal.      The entire examined colon appeared normal.      The retroflexed view of the distal rectum and anal verge was normal and       showed no anal or rectal abnormalities. Impression:            - The examined portion of the ileum was normal.                        - The entire examined colon is normal.                        - The distal rectum and anal verge are normal on                         retroflexion view.                        - No specimens collected. Recommendation:        - Discharge patient to home (with escort).                        -  Resume previous diet today.                        - Continue present medications.                        - Repeat colonoscopy in 5 years for screening purposes                         given family history of colon cancer in her mother                         below 84years. Procedure Code(s):     --- Professional ---                        O4599, Colorectal cancer screening; colonoscopy on                         individual at high risk Diagnosis Code(s):     --- Professional ---                        Z86.010, Personal history of colonic polyps CPT copyright 2019 American Medical Association. All rights reserved. The codes documented in this report are preliminary and upon coder review may  be revised to meet current compliance requirements. Dr. Ulyess Mort Lin Landsman MD, MD 10/31/2021 8:13:08 AM This report has been signed electronically. Number of Addenda: 0 Note Initiated On: 10/31/2021  7:10 AM Scope Withdrawal Time: 0 hours 9 minutes 52 seconds  Total Procedure Duration: 0 hours 15 minutes 27 seconds  Estimated Blood Loss:  Estimated blood loss: none.      [facility]

## 2021-11-01 ENCOUNTER — Other Ambulatory Visit (HOSPITAL_COMMUNITY): Payer: Self-pay

## 2021-11-03 ENCOUNTER — Other Ambulatory Visit (HOSPITAL_COMMUNITY): Payer: Self-pay

## 2021-11-03 ENCOUNTER — Encounter: Payer: Self-pay | Admitting: Gastroenterology

## 2021-11-06 NOTE — Telephone Encounter (Signed)
Patient is scheduled for 11/27/21 with [facility]

## 2021-11-18 ENCOUNTER — Encounter (INDEPENDENT_AMBULATORY_CARE_PROVIDER_SITE_OTHER): Payer: Self-pay | Admitting: Family Medicine

## 2021-11-18 ENCOUNTER — Ambulatory Visit (INDEPENDENT_AMBULATORY_CARE_PROVIDER_SITE_OTHER): Payer: 59 | Admitting: Family Medicine

## 2021-11-18 ENCOUNTER — Other Ambulatory Visit (HOSPITAL_COMMUNITY): Payer: Self-pay

## 2021-11-18 VITALS — BP 121/73 | HR 91 | Temp 97.5°F | Ht 68.0 in | Wt 182.0 lb

## 2021-11-18 DIAGNOSIS — D508 Other iron deficiency anemias: Secondary | ICD-10-CM | POA: Diagnosis not present

## 2021-11-18 DIAGNOSIS — E559 Vitamin D deficiency, unspecified: Secondary | ICD-10-CM

## 2021-11-18 DIAGNOSIS — Z6827 Body mass index (BMI) 27.0-27.9, adult: Secondary | ICD-10-CM

## 2021-11-18 DIAGNOSIS — E669 Obesity, unspecified: Secondary | ICD-10-CM

## 2021-11-18 MED ORDER — WEGOVY 1.7 MG/0.75ML ~~LOC~~ SOAJ
1.7000 mg | SUBCUTANEOUS | 0 refills | Status: DC
  Filled 2021-11-18: qty 9, 84d supply, fill #0
  Filled 2021-12-25: qty 3, 28d supply, fill #0

## 2021-11-20 LAB — ANEMIA PANEL
Ferritin: 27 ng/mL (ref 15–150)
Folate, Hemolysate: 436 ng/mL
Folate, RBC: 1121 ng/mL (ref >498)
Hematocrit: 38.9 % (ref 34.0–46.6)
Iron Saturation: 12 % — ABNORMAL LOW (ref 15–55)
Iron: 45 ug/dL (ref 27–159)
Retic Ct Pct: 1.7 % (ref 0.6–2.6)
Total Iron Binding Capacity: 365 ug/dL (ref 250–450)
UIBC: 320 ug/dL (ref 131–425)
Vitamin B-12: 763 pg/mL (ref 232–1245)

## 2021-11-20 LAB — CBC
Hemoglobin: 12.9 g/dL (ref 11.1–15.9)
MCH: 29.5 pg (ref 26.6–33.0)
MCHC: 33.2 g/dL (ref 31.5–35.7)
MCV: 89 fL (ref 79–97)
Platelets: 237 10*3/uL (ref 150–450)
RBC: 4.38 x10E6/uL (ref 3.77–5.28)
RDW: 14.5 % (ref 11.7–15.4)
WBC: 6.3 10*3/uL (ref 3.4–10.8)

## 2021-11-20 NOTE — Progress Notes (Signed)
Chief Complaint:   OBESITY Angela [name] is here to discuss her progress with her obesity treatment plan along with follow-up of her obesity related diagnoses. Angela [name] is on the Category 4 Plan and states she is following her eating plan approximately 70-80% of the time. Angela [name] states she is going to the gym or walking 30 minutes 2-3 times per week.  Today's visit was #: 85 Starting weight: 220 lbs Starting date: 10/05/2018 Today's weight: 182 lbs Today's date: 11/18/2021 Total lbs lost to date: 38 lbs Total lbs lost since last in-office visit: 3  Interim History: Angela [name] went away to [place] over her birthday. She mentions she spent a day ion [place]. She felt following plan was relatively easy even while away. Breakfast was easy to follow. She also walked a lot while away. No planned events or travel for the next few months.  Subjective:   1. Other iron deficiency anemia Angela [name]'s last Iron of 61, Ferritin of 41, and TIBC of 341, H/H 12.5/39.6. She is currently on Fusion plus.  2. Vitamin D deficiency Angela [name] is currently taking Vit D 2k IU daily. She notes fatigue. Denies any nausea, vomiting or muscle weakness.  Assessment/Plan:   1. Other iron deficiency anemia We will obtain labs today.  - CBC - Anemia panel  2. Vitamin D deficiency Angela [name] will continue taking over the counter Vit D.  3. Obesity with current BMI of 27.8 We will refill Wegovy 1.7 mg SubQ once weekly for 1 month with 0 refills.  -Refill Semaglutide-Weight Management (WEGOVY) 1.7 MG/0.75ML SOAJ; Inject 1 pen (1.7 mg) into the skin once a week.  Dispense: 9 mL; Refill: 0  Angela [name] is currently in the action stage of change. As such, her goal is to continue with weight loss efforts. She has agreed to the Category 3 Plan and the Category 4 Plan.   Exercise goals: As is.  Behavioral modification strategies: increasing lean protein intake, meal planning and cooking strategies, keeping healthy foods in the home, and  planning for success.  Angela [name] has agreed to follow-up with our clinic in 8 weeks. She was informed of the importance of frequent follow-up visits to maximize her success with intensive lifestyle modifications for her multiple health conditions.   Angela [name] was informed we would discuss her lab results at her next visit unless there is a critical issue that needs to be addressed sooner. Angela [name] agreed to keep her next visit at the agreed upon time to discuss these results.  Objective:   Blood pressure 121/73, pulse 91, temperature (!) 97.5 F (36.4 C), height '5\' 8"'$  (1.727 m), weight 182 lb (82.6 kg), SpO2 99 %. Body mass index is 27.67 kg/m.  General: Cooperative, alert, well developed, in no acute distress. HEENT: Conjunctivae and lids unremarkable. Cardiovascular: Regular rhythm.  Lungs: Normal work of breathing. Neurologic: No focal deficits.   Lab Results  Component Value Date   CREATININE 0.67 05/13/2021   BUN 17 05/13/2021   NA 141 05/13/2021   K 4.6 05/13/2021   CL 106 05/13/2021   CO2 20 05/13/2021   Lab Results  Component Value Date   ALT 14 05/13/2021   AST 18 05/13/2021   ALKPHOS 67 05/13/2021   BILITOT 0.3 05/13/2021   Lab Results  Component Value Date   HGBA1C 5.5 05/13/2021   HGBA1C 5.6 10/30/2020   HGBA1C 5.5 11/01/2019   HGBA1C 5.5 05/25/2019   HGBA1C 5.5 02/02/2019   Lab Results  Component Value Date   INSULIN 18.9  05/13/2021   INSULIN 24.4 11/01/2019   INSULIN 24.5 05/25/2019   INSULIN 21.9 02/02/2019   INSULIN 61.1 (H) 10/05/2018   Lab Results  Component Value Date   TSH 2.020 10/05/2018   Lab Results  Component Value Date   CHOL 144 10/30/2020   HDL 55.50 10/30/2020   LDLCALC 72 10/30/2020   TRIG 81.0 10/30/2020   CHOLHDL 3 10/30/2020   Lab Results  Component Value Date   VD25OH 91.7 05/13/2021   VD25OH 29.02 (L) 10/30/2020   VD25OH 39.9 11/01/2019   Lab Results  Component Value Date   WBC 6.3 11/18/2021   HGB 12.9 11/18/2021    HCT 38.9 11/18/2021   MCV 89 11/18/2021   PLT 237 11/18/2021   Lab Results  Component Value Date   IRON 45 11/18/2021   TIBC 365 11/18/2021   FERRITIN 27 11/18/2021   Attestation Statements:   Reviewed by clinician on day of visit: allergies, medications, problem list, medical history, surgical history, family history, social history, and previous encounter notes.  I, Elnora Morrison, RMA am acting as transcriptionist for Coralie Common, MD.  I have reviewed the above documentation for accuracy and completeness, and I agree with the above. - Coralie Common, MD

## 2021-11-26 ENCOUNTER — Encounter: Payer: Self-pay | Admitting: Primary Care

## 2021-11-26 ENCOUNTER — Ambulatory Visit (INDEPENDENT_AMBULATORY_CARE_PROVIDER_SITE_OTHER): Payer: 59 | Admitting: Primary Care

## 2021-11-26 ENCOUNTER — Encounter (INDEPENDENT_AMBULATORY_CARE_PROVIDER_SITE_OTHER): Payer: Self-pay

## 2021-11-26 VITALS — BP 110/70 | HR 86 | Temp 98.6°F | Ht 68.0 in | Wt 192.0 lb

## 2021-11-26 DIAGNOSIS — H409 Unspecified glaucoma: Secondary | ICD-10-CM | POA: Diagnosis not present

## 2021-11-26 DIAGNOSIS — Z0000 Encounter for general adult medical examination without abnormal findings: Secondary | ICD-10-CM | POA: Diagnosis not present

## 2021-11-26 DIAGNOSIS — E663 Overweight: Secondary | ICD-10-CM | POA: Diagnosis not present

## 2021-11-26 DIAGNOSIS — M62838 Other muscle spasm: Secondary | ICD-10-CM | POA: Diagnosis not present

## 2021-11-26 DIAGNOSIS — N920 Excessive and frequent menstruation with regular cycle: Secondary | ICD-10-CM | POA: Diagnosis not present

## 2021-11-26 DIAGNOSIS — Z6829 Body mass index (BMI) 29.0-29.9, adult: Secondary | ICD-10-CM | POA: Diagnosis not present

## 2021-11-26 DIAGNOSIS — K5909 Other constipation: Secondary | ICD-10-CM | POA: Diagnosis not present

## 2021-11-26 DIAGNOSIS — R7303 Prediabetes: Secondary | ICD-10-CM

## 2021-11-26 DIAGNOSIS — Z800 Family history of malignant neoplasm of digestive organs: Secondary | ICD-10-CM | POA: Diagnosis not present

## 2021-11-26 NOTE — Assessment & Plan Note (Signed)
Controlled. No recent use of muscle relaxers.  Continue chiropractor follow up visits.

## 2021-11-26 NOTE — Assessment & Plan Note (Signed)
Continued, somewhat improved.  Continue oral iron daily. Following with GYN.

## 2021-11-26 NOTE — Assessment & Plan Note (Signed)
Controlled.  Continue Linzess 145 mcg several days weekly PRN.  Continue PRN use of Metamucil.

## 2021-11-26 NOTE — Assessment & Plan Note (Signed)
Colonoscopy UTD, due 2028 

## 2021-11-26 NOTE — Assessment & Plan Note (Signed)
Commended her on weight loss!  Continue Wegovy 1.7 mg weekly [facility]

## 2021-11-26 NOTE — Assessment & Plan Note (Signed)
Following with ophthalmology. Continue Combigan drops and Atropine drops as prescribed.

## 2021-11-26 NOTE — Assessment & Plan Note (Signed)
Immunizations UTD. Pap smear scheduled. Mammogram scheduled. Colonoscopy UTD, due 2028.  Discussed the importance of a healthy diet and regular exercise in order for weight loss, and to reduce the risk of further co-morbidity.  Exam stable. Labs reviewed   Follow up in 1 year for repeat physical.

## 2021-11-26 NOTE — Patient Instructions (Signed)
It was a pleasure to see you today! ? ?Preventive Care 40-47 Years Old, Female ?Preventive care refers to lifestyle choices and visits with your health care provider that can promote health and wellness. Preventive care visits are also called wellness exams. ?What can I expect for my preventive care visit? ?Counseling ?Your health care provider may ask you questions about your: ?Medical history, including: ?Past medical problems. ?Family medical history. ?Pregnancy history. ?Current health, including: ?Menstrual cycle. ?Method of birth control. ?Emotional well-being. ?Home life and relationship well-being. ?Sexual activity and sexual health. ?Lifestyle, including: ?Alcohol, nicotine or tobacco, and drug use. ?Access to firearms. ?Diet, exercise, and sleep habits. ?Work and work environment. ?Sunscreen use. ?Safety issues such as seatbelt and bike helmet use. ?Physical exam ?Your health care provider will check your: ?Height and weight. These may be used to calculate your BMI (body mass index). BMI is a measurement that tells if you are at a healthy weight. ?Waist circumference. This measures the distance around your waistline. This measurement also tells if you are at a healthy weight and may help predict your risk of certain diseases, such as type 2 diabetes and high blood pressure. ?Heart rate and blood pressure. ?Body temperature. ?Skin for abnormal spots. ?What immunizations do I need? ? ?Vaccines are usually given at various ages, according to a schedule. Your health care provider will recommend vaccines for you based on your age, medical history, and lifestyle or other factors, such as travel or where you work. ?What tests do I need? ?Screening ?Your health care provider may recommend screening tests for certain conditions. This may include: ?Lipid and cholesterol levels. ?Diabetes screening. This is done by checking your blood sugar (glucose) after you have not eaten for a while (fasting). ?Pelvic exam and  Pap test. ?Hepatitis B test. ?Hepatitis C test. ?HIV (human immunodeficiency virus) test. ?STI (sexually transmitted infection) testing, if you are at risk. ?Lung cancer screening. ?Colorectal cancer screening. ?Mammogram. Talk with your health care provider about when you should start having regular mammograms. This may depend on whether you have a family history of breast cancer. ?BRCA-related cancer screening. This may be done if you have a family history of breast, ovarian, tubal, or peritoneal cancers. ?Bone density scan. This is done to screen for osteoporosis. ?Talk with your health care provider about your test results, treatment options, and if necessary, the need for more tests. ?Follow these instructions at home: ?Eating and drinking ? ?Eat a diet that includes fresh fruits and vegetables, whole grains, lean protein, and low-fat dairy products. ?Take vitamin and mineral supplements as recommended by your health care provider. ?Do not drink alcohol if: ?Your health care provider tells you not to drink. ?You are pregnant, may be pregnant, or are planning to become pregnant. ?If you drink alcohol: ?Limit how much you have to 0-1 drink a day. ?Know how much alcohol is in your drink. In the [place] one drink equals one 12 oz bottle of beer (355 mL), one 5 oz glass of wine (148 mL), or one 1? oz glass of hard liquor (44 mL). ?Lifestyle ?Brush your teeth every morning and night with fluoride toothpaste. Floss one time each day. ?Exercise for at least 30 minutes 5 or more days each week. ?Do not use any products that contain nicotine or tobacco. These products include cigarettes, chewing tobacco, and vaping devices, such as e-cigarettes. If you need help quitting, ask your health care provider. ?Do not use drugs. ?If you are sexually active, practice safe sex.   Use a condom or other form of protection to prevent STIs. If you do not wish to become pregnant, use a form of birth control. If you plan to become  pregnant, see your health care provider for a prepregnancy visit. Take aspirin only as told by your health care provider. Make sure that you understand how much to take and what form to take. Work with your health care provider to find out whether it is safe and beneficial for you to take aspirin daily. Find healthy ways to manage stress, such as: Meditation, yoga, or listening to music. Journaling. Talking to a trusted person. Spending time with friends and family. Minimize exposure to UV radiation to reduce your risk of skin cancer. Safety Always wear your seat belt while driving or riding in a vehicle. Do not drive: If you have been drinking alcohol. Do not ride with someone who has been drinking. When you are tired or distracted. While texting. If you have been using any mind-altering substances or drugs. Wear a helmet and other protective equipment during sports activities. If you have firearms in your house, make sure you follow all gun safety procedures. Seek help if you have been physically or sexually abused. What's next? Visit your health care provider once a year for an annual wellness visit. Ask your health care provider how often you should have your eyes and teeth checked. Stay up to date on all vaccines. This information is not intended to replace advice given to you by your health care provider. Make sure you discuss any questions you have with your health care provider. Document Revised: 10/02/2020 Document Reviewed: 10/02/2020 Elsevier Patient Education  [place].

## 2021-11-26 NOTE — Assessment & Plan Note (Signed)
Resolved.  Commended her on weight loss! Reviewed A1C from [place] [facility]

## 2021-11-26 NOTE — Progress Notes (Signed)
Subjective:    Patient ID: Angela [name], female    DOB: [birth year], 46 y.o.   MRN: [id number]  HPI  Angela [name] is a very pleasant 46 y.o. female who presents today for complete physical and follow up of chronic conditions.  Immunizations: -Tetanus: 2016? -Influenza: Due this season  -Covid-19: 3 vaccines  Diet: Fair diet.  Exercise: No regular exercise.  Eye exam: Completes annually  Dental exam: Completes semi-annually   Pap Smear: Scheduled for tomorrow.  Mammogram: Completed in June 2022, scheduled for August 2023  Colonoscopy: Completed in 2023, due 2028   BP Readings from Last 3 Encounters:  11/26/21 110/70  11/18/21 121/73  10/31/21 107/65   Wt Readings from Last 3 Encounters:  11/26/21 192 lb (87.1 kg)  11/18/21 182 lb (82.6 kg)  10/31/21 186 lb (84.4 kg)       Review of Systems  Constitutional:  Negative for unexpected weight change.  HENT:  Negative for rhinorrhea.   Respiratory:  Negative for cough and shortness of breath.   Cardiovascular:  Negative for chest pain.  Gastrointestinal:  Negative for constipation and diarrhea.  Genitourinary:  Negative for difficulty urinating and menstrual problem.  Musculoskeletal:  Negative for arthralgias and myalgias.  Skin:  Negative for rash.  Allergic/Immunologic: Negative for environmental allergies.  Neurological:  Negative for dizziness and headaches.  Psychiatric/Behavioral:  The patient is not nervous/anxious.          Past Medical History:  Diagnosis Date   Allergy Enviromental   Anemia 08/2015   Asthma    as a child   Back pain gerd   Constipation    Dysuria 11/25/2018   Glaucoma    Heart murmur childhood   IBS (irritable bowel syndrome)    Joint pain    Menorrhagia    Palpitations    Plantar fasciitis    Prediabetes    Swelling    feet, legs   Torn meniscus    Right   Vaginal burning 11/25/2018   Voice hoarseness 07/04/2018    Social History    Socioeconomic History   Marital status: Married    Spouse name: Financial planner   Number of children: 0   Years of education: Not on file   Highest education level: Not on file  Occupational History   Occupation: Agricultural consultant: [employer]  Tobacco Use   Smoking status: Never   Smokeless tobacco: Never  Vaping Use   Vaping Use: Never used  Substance and Sexual Activity   Alcohol use: Yes    Alcohol/week: 2.0 standard drinks of alcohol    Types: 2 Glasses of wine per week    Comment: occasional   Drug use: No   Sexual activity: Yes    Birth control/protection: None  Other Topics Concern   Not on file  Social History Narrative   Married.   No children.   Works in the [employer].   Enjoys watching movies, walking, reading.    Social Determinants of Health   Financial Resource Strain: Not on file  Food Insecurity: Not on file  Transportation Needs: Not on file  Physical Activity: Unknown (04/29/2018)   Exercise Vital Sign    Days of Exercise per Week: 0 days    Minutes of Exercise per Session: Not on file  Stress: Not on file  Social Connections: Not on file  Intimate Partner Violence: Not on file    Past Surgical History:  Procedure Laterality Date  CHOLECYSTECTOMY  2005   COLONOSCOPY WITH PROPOFOL N/A 10/27/2018   Procedure: COLONOSCOPY WITH PROPOFOL;  Surgeon: Lin Landsman, MD;  Location: [facility] ENDOSCOPY;  Service: Gastroenterology;  Laterality: N/A;   COLONOSCOPY WITH PROPOFOL N/A 10/31/2021   Procedure: COLONOSCOPY WITH PROPOFOL;  Surgeon: Lin Landsman, MD;  Location: [facility] ENDOSCOPY;  Service: Gastroenterology;  Laterality: N/A;   DILATATION & CURETTAGE/HYSTEROSCOPY WITH MYOSURE N/A 06/23/2018   Procedure: DILATATION & CURETTAGE/HYSTEROSCOPY WITH MYOSURE;  Surgeon: Will Bonnet, MD;  Location: [facility] ORS;  Service: Gynecology;  Laterality: N/A;   DILATION AND CURETTAGE OF UTERUS     ERCP     3 times    ESOPHAGOGASTRODUODENOSCOPY (EGD) WITH PROPOFOL  10/27/2018   Procedure: ESOPHAGOGASTRODUODENOSCOPY (EGD) WITH PROPOFOL;  Surgeon: Lin Landsman, MD;  Location: [facility] ENDOSCOPY;  Service: Gastroenterology;;   EYE SURGERY     GIVENS CAPSULE STUDY N/A 12/11/2019   Procedure: GIVENS CAPSULE STUDY;  Surgeon: Lin Landsman, MD;  Location: [facility] ENDOSCOPY;  Service: Gastroenterology;  Laterality: N/A;   MENISCUS REPAIR Right    x 2    Family History  Problem Relation Age of Onset   Colon cancer Mother 25   Diabetes Mother    Hypertension Mother    Arthritis Mother    Transient ischemic attack Mother    Liver disease Mother    Obesity Mother    Diabetes Father    Heart disease Father    Breast cancer Maternal Aunt        mat great aunt   Esophageal cancer Maternal Grandmother    Cancer Maternal Grandmother    Diabetes Maternal Grandmother    Alcohol abuse Maternal Grandfather     Allergies  Allergen Reactions   Quinoa-Kale-Hemp [Alimentum]     Current Outpatient Medications on File Prior to Visit  Medication Sig Dispense Refill   atropine 1 % ophthalmic solution Place 1 drop into the right eye daily. 5 mL 11   brimonidine-timolol (COMBIGAN) 0.2-0.5 % ophthalmic solution Place 1 drop into the right eye every 12 hours. 10 mL 11   cetirizine (ZYRTEC) 10 MG tablet Take 10 mg by mouth daily as needed for allergies.     Cholecalciferol (VITAMIN D3) 50 MCG (2000 UT) capsule Take 1 capsule (2,000 Units total) by mouth daily.     ibuprofen (ADVIL,MOTRIN) 600 MG tablet Take 1 tablet (600 mg total) by mouth every 6 (six) hours as needed for mild pain, moderate pain or cramping. 30 tablet 0   Iron-FA-B Cmp-C-Biot-Probiotic (FUSION PLUS) CAPS Take 1 capsule by mouth daily. 90 capsule 0   linaclotide (LINZESS) 145 MCG CAPS capsule TAKE 1 CAPSULE BY MOUTH ONCE DAILY BEFORE BREAKFAST 90 capsule 3   psyllium (METAMUCIL) 58.6 % packet Take 1 packet by mouth daily.     Semaglutide-Weight  Management (WEGOVY) 1.7 MG/0.75ML SOAJ Inject 1 pen (1.7 mg) into the skin once a week. 9 mL 0   No current facility-administered medications on file prior to visit.    BP 110/70   Pulse 86   Temp 98.6 F (37 C) (Oral)   Ht '5\' 8"'$  (1.727 m)   Wt 192 lb (87.1 kg)   LMP 11/16/2021   BMI 29.19 kg/m  Objective:   Physical Exam HENT:     Right Ear: Tympanic membrane and ear canal normal.     Left Ear: Tympanic membrane and ear canal normal.     Nose: Nose normal.  Eyes:     Conjunctiva/sclera: Conjunctivae normal.  Pupils: Pupils are equal, round, and reactive to light.  Neck:     Thyroid: No thyromegaly.  Cardiovascular:     Rate and Rhythm: Normal rate and regular rhythm.     Heart sounds: No murmur heard. Pulmonary:     Effort: Pulmonary effort is normal.     Breath sounds: Normal breath sounds. No rales.  Abdominal:     General: Bowel sounds are normal.     Palpations: Abdomen is soft.     Tenderness: There is no abdominal tenderness.  Musculoskeletal:        General: Normal range of motion.     Cervical back: Neck supple.  Lymphadenopathy:     Cervical: No cervical adenopathy.  Skin:    General: Skin is warm and dry.     Findings: No rash.  Neurological:     Mental Status: She is alert and oriented to person, place, and time.     Cranial Nerves: No cranial nerve deficit.     Deep Tendon Reflexes: Reflexes are normal and symmetric.  Psychiatric:        Mood and Affect: Mood normal.           Assessment & Plan:   Problem List Items Addressed This Visit       Digestive   Chronic constipation    Controlled.  Continue Linzess 145 mcg several days weekly PRN.  Continue PRN use of Metamucil.        Other   Prediabetes    Resolved.  Commended her on weight loss! Reviewed A1C from [place] through [facility]      Glaucoma    Following with ophthalmology. Continue Combigan drops and Atropine drops as prescribed.        Menorrhagia with regular cycle    Continued, somewhat improved.  Continue oral iron daily. Following with GYN.       Preventative health care - Primary    Immunizations UTD. Pap smear scheduled. Mammogram scheduled. Colonoscopy UTD, due 2028.  Discussed the importance of a healthy diet and regular exercise in order for weight loss, and to reduce the risk of further co-morbidity.  Exam stable. Labs reviewed   Follow up in 1 year for repeat physical.       Muscle spasm    Controlled. No recent use of muscle relaxers.  Continue chiropractor follow up visits.      Family history of colon cancer in mother    Colonoscopy UTD, due 2028.      Overweight with body mass index (BMI) of 29 to 29.9 in adult    Commended her on weight loss!  Continue Wegovy 1.7 mg weekly Following with [facility].          Pleas Koch, NP

## 2021-11-27 ENCOUNTER — Other Ambulatory Visit (HOSPITAL_COMMUNITY)
Admission: RE | Admit: 2021-11-27 | Discharge: 2021-11-27 | Disposition: A | Discharge status: Home / Self Care | Payer: 59 | Source: Ambulatory Visit | Attending: Obstetrics & Gynecology | Admitting: Obstetrics & Gynecology

## 2021-11-27 ENCOUNTER — Encounter: Payer: Self-pay | Admitting: Obstetrics & Gynecology

## 2021-11-27 ENCOUNTER — Ambulatory Visit (INDEPENDENT_AMBULATORY_CARE_PROVIDER_SITE_OTHER): Payer: 59 | Admitting: Obstetrics & Gynecology

## 2021-11-27 VITALS — BP 120/80 | Ht 68.0 in | Wt 193.0 lb

## 2021-11-27 DIAGNOSIS — Z124 Encounter for screening for malignant neoplasm of cervix: Secondary | ICD-10-CM | POA: Diagnosis not present

## 2021-11-27 DIAGNOSIS — Z01419 Encounter for gynecological examination (general) (routine) without abnormal findings: Secondary | ICD-10-CM

## 2021-12-01 LAB — CYTOLOGY - PAP
Comment: NEGATIVE
Diagnosis: NEGATIVE
High risk HPV: NEGATIVE

## 2021-12-04 ENCOUNTER — Ambulatory Visit
Admission: RE | Admit: 2021-12-04 | Discharge: 2021-12-04 | Disposition: A | Discharge status: Home / Self Care | Payer: 59 | Source: Ambulatory Visit | Attending: Primary Care | Admitting: Primary Care

## 2021-12-04 DIAGNOSIS — Z1231 Encounter for screening mammogram for malignant neoplasm of breast: Secondary | ICD-10-CM | POA: Insufficient documentation

## 2021-12-08 ENCOUNTER — Other Ambulatory Visit: Payer: Self-pay | Admitting: Primary Care

## 2021-12-08 DIAGNOSIS — R928 Other abnormal and inconclusive findings on diagnostic imaging of breast: Secondary | ICD-10-CM

## 2021-12-08 DIAGNOSIS — N63 Unspecified lump in unspecified breast: Secondary | ICD-10-CM

## 2021-12-15 ENCOUNTER — Ambulatory Visit
Admission: RE | Admit: 2021-12-15 | Discharge: 2021-12-15 | Disposition: A | Discharge status: Home / Self Care | Payer: 59 | Source: Ambulatory Visit

## 2021-12-15 ENCOUNTER — Other Ambulatory Visit: Payer: Self-pay | Admitting: Primary Care

## 2021-12-15 ENCOUNTER — Ambulatory Visit
Admission: RE | Admit: 2021-12-15 | Discharge: 2021-12-15 | Disposition: A | Discharge status: Home / Self Care | Payer: 59 | Source: Ambulatory Visit | Attending: Primary Care | Admitting: Primary Care

## 2021-12-15 DIAGNOSIS — N6310 Unspecified lump in the right breast, unspecified quadrant: Secondary | ICD-10-CM | POA: Diagnosis not present

## 2021-12-15 DIAGNOSIS — R922 Inconclusive mammogram: Secondary | ICD-10-CM | POA: Diagnosis not present

## 2021-12-15 DIAGNOSIS — R928 Other abnormal and inconclusive findings on diagnostic imaging of breast: Secondary | ICD-10-CM | POA: Insufficient documentation

## 2021-12-15 DIAGNOSIS — N63 Unspecified lump in unspecified breast: Secondary | ICD-10-CM | POA: Diagnosis not present

## 2021-12-25 ENCOUNTER — Other Ambulatory Visit (HOSPITAL_COMMUNITY): Payer: Self-pay

## 2021-12-29 ENCOUNTER — Other Ambulatory Visit (HOSPITAL_COMMUNITY): Payer: Self-pay

## 2022-01-13 ENCOUNTER — Ambulatory Visit (INDEPENDENT_AMBULATORY_CARE_PROVIDER_SITE_OTHER): Payer: 59 | Admitting: Family Medicine

## 2022-01-13 ENCOUNTER — Encounter (INDEPENDENT_AMBULATORY_CARE_PROVIDER_SITE_OTHER): Payer: Self-pay | Admitting: Family Medicine

## 2022-01-13 ENCOUNTER — Other Ambulatory Visit (HOSPITAL_COMMUNITY): Payer: Self-pay

## 2022-01-13 VITALS — BP 109/68 | HR 82 | Temp 98.8°F | Ht 68.0 in | Wt 188.0 lb

## 2022-01-13 DIAGNOSIS — E559 Vitamin D deficiency, unspecified: Secondary | ICD-10-CM | POA: Diagnosis not present

## 2022-01-13 DIAGNOSIS — E669 Obesity, unspecified: Secondary | ICD-10-CM | POA: Diagnosis not present

## 2022-01-13 DIAGNOSIS — Z6828 Body mass index (BMI) 28.0-28.9, adult: Secondary | ICD-10-CM | POA: Diagnosis not present

## 2022-01-13 MED ORDER — WEGOVY 2.4 MG/0.75ML ~~LOC~~ SOAJ
2.4000 mg | SUBCUTANEOUS | 0 refills | Status: DC
  Filled 2022-01-13: qty 3, 28d supply, fill #0
  Filled 2022-02-19: qty 3, 28d supply, fill #1

## 2022-01-15 NOTE — Progress Notes (Signed)
Chief Complaint:   OBESITY Angela [name] is here to discuss her progress with her obesity treatment plan along with follow-up of her obesity related diagnoses. Angela [name] is on the Category 4 Plan and states she is following her eating plan approximately 50-60% of the time. Angela [name] states she is walking for 20 minutes 2 times per week.  Today's visit was #: 53 Starting weight: 220 lbs Starting date: 10/05/2018 Today's weight: 188 lbs Today's date: 01/13/22 Total lbs lost to date: 32 Total lbs lost since last in-office visit: +6  Interim History: Patient voices over the last few weeks she has been doing CE's at home and has been eating more indulgent food and has been less in control of quantity.  She is almost done with CE's and nothing else that will sabotage her.  She went grocery shopping so food is now at home.  Subjective:   1. Vitamin D deficiency On OTC vitamin D. No nausea/vomiting/muscle weakness.  Notes fatigue.  Assessment/Plan:   1. Vitamin D deficiency Vitamin D level at next appointment.  2. Obesity with current BMI of 28.6 Refill: - Semaglutide-Weight Management (WEGOVY) 2.4 MG/0.75ML SOAJ; Inject 2.4 mg into the skin once a week.  Dispense: 9 mL; Refill: 0  Angela [name] is currently in the action stage of change. As such, her goal is to continue with weight loss efforts. She has agreed to the Category 3 Plan and the Category 4 Plan.   Exercise goals: All adults should avoid inactivity. Some physical activity is better than none, and adults who participate in any amount of physical activity gain some health benefits.  Behavioral modification strategies: increasing lean protein intake, meal planning and cooking strategies, keeping healthy foods in the home, and planning for success.  Adam has agreed to follow-up with our clinic in 10 weeks. She was informed of the importance of frequent follow-up visits to maximize her success with intensive lifestyle modifications for her  multiple health conditions.   Objective:   Blood pressure 109/68, pulse 82, temperature 98.8 F (37.1 C), height '5\' 8"'$  (1.727 m), weight 188 lb (85.3 kg), last menstrual period 12/27/2021, SpO2 97 %. Body mass index is 28.59 kg/m.  General: Cooperative, alert, well developed, in no acute distress. HEENT: Conjunctivae and lids unremarkable. Cardiovascular: Regular rhythm.  Lungs: Normal work of breathing. Neurologic: No focal deficits.   Lab Results  Component Value Date   CREATININE 0.67 05/13/2021   BUN 17 05/13/2021   NA 141 05/13/2021   K 4.6 05/13/2021   CL 106 05/13/2021   CO2 20 05/13/2021   Lab Results  Component Value Date   ALT 14 05/13/2021   AST 18 05/13/2021   ALKPHOS 67 05/13/2021   BILITOT 0.3 05/13/2021   Lab Results  Component Value Date   HGBA1C 5.5 05/13/2021   HGBA1C 5.6 10/30/2020   HGBA1C 5.5 11/01/2019   HGBA1C 5.5 05/25/2019   HGBA1C 5.5 02/02/2019   Lab Results  Component Value Date   INSULIN 18.9 05/13/2021   INSULIN 24.4 11/01/2019   INSULIN 24.5 05/25/2019   INSULIN 21.9 02/02/2019   INSULIN 61.1 (H) 10/05/2018   Lab Results  Component Value Date   TSH 2.020 10/05/2018   Lab Results  Component Value Date   CHOL 144 10/30/2020   HDL 55.50 10/30/2020   LDLCALC 72 10/30/2020   TRIG 81.0 10/30/2020   CHOLHDL 3 10/30/2020   Lab Results  Component Value Date   VD25OH 91.7 05/13/2021   VD25OH 29.02 (L)  10/30/2020   VD25OH 39.9 11/01/2019   Lab Results  Component Value Date   WBC 6.3 11/18/2021   HGB 12.9 11/18/2021   HCT 38.9 11/18/2021   MCV 89 11/18/2021   PLT 237 11/18/2021   Lab Results  Component Value Date   IRON 45 11/18/2021   TIBC 365 11/18/2021   FERRITIN 27 11/18/2021    Attestation Statements:   Reviewed by clinician on day of visit: allergies, medications, problem list, medical history, surgical history, family history, social history, and previous encounter notes.  I, Dawn Whitmire, FNP-C, am acting  as Location manager for Coralie Common, MD.  I have reviewed the above documentation for accuracy and completeness, and I agree with the above. - Coralie Common, MD

## 2022-01-17 NOTE — Progress Notes (Signed)
Subjective:     Angela [name] is a 46 y.o. female here for a routine exam.  Current complaints: none.  Personal health questionnaire reviewed: yes.   Gynecologic History Patient's last menstrual period was 11/16/2021. Contraception: none Last Pap: 05/13/2018. Results were: normal Last mammo: 09/27/2020. Results were: normal  Obstetric History OB History  Gravida Para Term Preterm AB Living  0 0 0 0 0 0  SAB IAB Ectopic Multiple Live Births  0 0 0 0 0     The following portions of the patient's history were reviewed and updated as appropriate: allergies, current medications, past family history, past medical history, past social history, past surgical history, and problem list.  Review of Systems A comprehensive review of systems was negative.    Objective:    BP 120/80   Ht '5\' 8"'$  (1.727 m)   Wt 193 lb (87.5 kg)   LMP 11/16/2021   BMI 29.35 kg/m  General appearance: alert, cooperative, and no distress Lungs: clear to auscultation bilaterally and normal percussion bilaterally Heart: regular rate and rhythm, S1, S2 normal, no murmur, click, rub or gallop Abdomen: soft, non-tender; bowel sounds normal; no masses,  no organomegaly Pelvic: cervix normal in appearance, external genitalia normal, no adnexal masses or tenderness, no cervical motion tenderness, uterus normal size, shape, and consistency, and vagina normal without discharge Extremities: extremities normal, atraumatic, no cyanosis or edema Skin: Skin color, texture, turgor normal. No rashes or lesions    Assessment:    Healthy female exam.    Plan:  PAP   Return as scheduled Rosario Adie, MD  01/17/2022 1:45 PM

## 2022-01-29 ENCOUNTER — Other Ambulatory Visit (HOSPITAL_COMMUNITY): Payer: Self-pay

## 2022-01-30 ENCOUNTER — Other Ambulatory Visit (HOSPITAL_COMMUNITY): Payer: Self-pay

## 2022-02-02 ENCOUNTER — Other Ambulatory Visit (HOSPITAL_COMMUNITY): Payer: Self-pay

## 2022-02-03 ENCOUNTER — Other Ambulatory Visit (HOSPITAL_COMMUNITY): Payer: Self-pay

## 2022-02-19 ENCOUNTER — Other Ambulatory Visit (HOSPITAL_COMMUNITY): Payer: Self-pay

## 2022-03-16 ENCOUNTER — Other Ambulatory Visit (HOSPITAL_COMMUNITY): Payer: Self-pay

## 2022-03-16 MED ORDER — BRIMONIDINE TARTRATE-TIMOLOL 0.2-0.5 % OP SOLN
1.0000 [drp] | Freq: Two times a day (BID) | OPHTHALMIC | 11 refills | Status: DC
  Filled 2022-03-16: qty 10, 90d supply, fill #0
  Filled 2022-06-09: qty 5, 40d supply, fill #1
  Filled 2022-07-29: qty 15, 90d supply, fill #0
  Filled 2022-11-02: qty 15, 90d supply, fill #1
  Filled 2023-02-02: qty 5, 90d supply, fill #2

## 2022-03-17 ENCOUNTER — Other Ambulatory Visit (HOSPITAL_COMMUNITY): Payer: Self-pay

## 2022-03-17 ENCOUNTER — Ambulatory Visit (INDEPENDENT_AMBULATORY_CARE_PROVIDER_SITE_OTHER): Payer: 59 | Admitting: Family Medicine

## 2022-03-17 ENCOUNTER — Encounter (INDEPENDENT_AMBULATORY_CARE_PROVIDER_SITE_OTHER): Payer: Self-pay | Admitting: Family Medicine

## 2022-03-17 VITALS — BP 104/66 | HR 87 | Temp 97.5°F | Ht 68.0 in | Wt 186.0 lb

## 2022-03-17 DIAGNOSIS — D508 Other iron deficiency anemias: Secondary | ICD-10-CM | POA: Diagnosis not present

## 2022-03-17 DIAGNOSIS — Z6828 Body mass index (BMI) 28.0-28.9, adult: Secondary | ICD-10-CM

## 2022-03-17 DIAGNOSIS — E669 Obesity, unspecified: Secondary | ICD-10-CM

## 2022-03-17 MED ORDER — WEGOVY 2.4 MG/0.75ML ~~LOC~~ SOAJ
2.4000 mg | SUBCUTANEOUS | 0 refills | Status: DC
  Filled 2022-03-17: qty 3, 28d supply, fill #0
  Filled 2022-04-13 – 2022-04-14 (×4): qty 3, 28d supply, fill #1

## 2022-03-17 MED ORDER — FUSION PLUS PO CAPS
1.0000 | ORAL_CAPSULE | Freq: Every day | ORAL | 0 refills | Status: DC
  Filled 2022-03-17: qty 90, 90d supply, fill #0

## 2022-03-18 ENCOUNTER — Other Ambulatory Visit (HOSPITAL_COMMUNITY): Payer: Self-pay

## 2022-03-19 ENCOUNTER — Other Ambulatory Visit (HOSPITAL_COMMUNITY): Payer: Self-pay

## 2022-03-20 ENCOUNTER — Other Ambulatory Visit (HOSPITAL_COMMUNITY): Payer: Self-pay

## 2022-03-26 NOTE — Progress Notes (Signed)
Chief Complaint:   OBESITY Angela [name] is here to discuss her progress with her obesity treatment plan along with follow-up of her obesity related diagnoses. Angela [name] is on the Category 4 Plan and states she is following her eating plan approximately 60% of the time. Angela [name] states she is walking/gym 15-20 minutes 1-3 times per week.  Today's visit was #: 65 Starting weight: 220 lbs Starting date: 10/05/2018 Today's weight: 186 lbs Today's date: 03/17/2022 Total lbs lost to date: 34 lbs Total lbs lost since last in-office visit: 2  Interim History: Angela [name] has had her husband's birthday, Thanksgiving and work over the last few weeks.  Has been mostly doing Cat 4 but occasionally just trying to keep protein high.  She has been minimally indulged.  Going to [name] the day after Christmas for 1.5 weeks.  Subjective:   1. Other iron deficiency anemia Angela [name] is on fusion plus, multivitamin.  Her last Iron, Ferritin was in normal limits but iron sat slightly decreased.  Assessment/Plan:   1. Other iron deficiency anemia We will refill Fusion Plus 1 capsule daily for 3 months with 0 refills.  -Refill Iron-FA-B Cmp-C-Biot-Probiotic (FUSION PLUS) CAPS; Take 1 capsule by mouth daily.  Dispense: 90 capsule; Refill: 0  2. Obesity with current BMI of 28.4 We will refill Wegovy 2.4 mg SubQ once weekly for 1 month with 0 refills.  -Refill Semaglutide-Weight Management (WEGOVY) 2.4 MG/0.75ML SOAJ; Inject 2.4 mg into the skin once a week.  Dispense: 9 mL; Refill: 0  Angela [name] is currently in the action stage of change. As such, her goal is to continue with weight loss efforts. She has agreed to the Category 4 Plan.   Exercise goals: All adults should avoid inactivity. Some physical activity is better than none, and adults who participate in any amount of physical activity gain some health benefits.  Angela [name] will try to incorporate more resistance training over the next few weeks.  Behavioral  modification strategies: increasing lean protein intake, meal planning and cooking strategies, keeping healthy foods in the home, travel eating strategies, holiday eating strategies , and planning for success.  Angela [name] has agreed to follow-up with our clinic in 8 weeks (fasting). She was informed of the importance of frequent follow-up visits to maximize her success with intensive lifestyle modifications for her multiple health conditions.   Objective:   Blood pressure 104/66, pulse 87, temperature (!) 97.5 F (36.4 C), height '5\' 8"'$  (1.727 m), weight 186 lb (84.4 kg), last menstrual period 03/16/2022, SpO2 97 %. Body mass index is 28.28 kg/m.  General: Cooperative, alert, well developed, in no acute distress. HEENT: Conjunctivae and lids unremarkable. Cardiovascular: Regular rhythm.  Lungs: Normal work of breathing. Neurologic: No focal deficits.   Lab Results  Component Value Date   CREATININE 0.67 05/13/2021   BUN 17 05/13/2021   NA 141 05/13/2021   K 4.6 05/13/2021   CL 106 05/13/2021   CO2 20 05/13/2021   Lab Results  Component Value Date   ALT 14 05/13/2021   AST 18 05/13/2021   ALKPHOS 67 05/13/2021   BILITOT 0.3 05/13/2021   Lab Results  Component Value Date   HGBA1C 5.5 05/13/2021   HGBA1C 5.6 10/30/2020   HGBA1C 5.5 11/01/2019   HGBA1C 5.5 05/25/2019   HGBA1C 5.5 02/02/2019   Lab Results  Component Value Date   INSULIN 18.9 05/13/2021   INSULIN 24.4 11/01/2019   INSULIN 24.5 05/25/2019   INSULIN 21.9 02/02/2019   INSULIN 61.1 (H)  10/05/2018   Lab Results  Component Value Date   TSH 2.020 10/05/2018   Lab Results  Component Value Date   CHOL 144 10/30/2020   HDL 55.50 10/30/2020   LDLCALC 72 10/30/2020   TRIG 81.0 10/30/2020   CHOLHDL 3 10/30/2020   Lab Results  Component Value Date   VD25OH 91.7 05/13/2021   VD25OH 29.02 (L) 10/30/2020   VD25OH 39.9 11/01/2019   Lab Results  Component Value Date   WBC 6.3 11/18/2021   HGB 12.9 11/18/2021    HCT 38.9 11/18/2021   MCV 89 11/18/2021   PLT 237 11/18/2021   Lab Results  Component Value Date   IRON 45 11/18/2021   TIBC 365 11/18/2021   FERRITIN 27 11/18/2021   Attestation Statements:   Reviewed by clinician on day of visit: allergies, medications, problem list, medical history, surgical history, family history, social history, and previous encounter notes.  I, Elnora Morrison, RMA am acting as transcriptionist for Coralie Common, MD.  I have reviewed the above documentation for accuracy and completeness, and I agree with the above. - Coralie Common, MD

## 2022-04-14 ENCOUNTER — Other Ambulatory Visit (HOSPITAL_COMMUNITY): Payer: Self-pay

## 2022-04-14 ENCOUNTER — Other Ambulatory Visit (HOSPITAL_BASED_OUTPATIENT_CLINIC_OR_DEPARTMENT_OTHER): Payer: Self-pay

## 2022-04-14 ENCOUNTER — Other Ambulatory Visit: Payer: Self-pay

## 2022-04-15 ENCOUNTER — Other Ambulatory Visit (HOSPITAL_COMMUNITY): Payer: Self-pay

## 2022-04-27 ENCOUNTER — Encounter (INDEPENDENT_AMBULATORY_CARE_PROVIDER_SITE_OTHER): Payer: Self-pay | Admitting: Family Medicine

## 2022-04-27 NOTE — Telephone Encounter (Signed)
Please reschedule appt

## 2022-04-30 DIAGNOSIS — N6310 Unspecified lump in the right breast, unspecified quadrant: Secondary | ICD-10-CM

## 2022-05-11 ENCOUNTER — Ambulatory Visit (INDEPENDENT_AMBULATORY_CARE_PROVIDER_SITE_OTHER): Payer: Managed Care, Other (non HMO) | Admitting: Family Medicine

## 2022-05-14 ENCOUNTER — Encounter (INDEPENDENT_AMBULATORY_CARE_PROVIDER_SITE_OTHER): Payer: Self-pay | Admitting: Family Medicine

## 2022-05-14 ENCOUNTER — Other Ambulatory Visit: Payer: Self-pay

## 2022-05-14 ENCOUNTER — Ambulatory Visit (INDEPENDENT_AMBULATORY_CARE_PROVIDER_SITE_OTHER): Payer: 59 | Admitting: Family Medicine

## 2022-05-14 VITALS — BP 118/74 | HR 97 | Temp 98.4°F | Ht 68.0 in | Wt 193.0 lb

## 2022-05-14 DIAGNOSIS — Z6829 Body mass index (BMI) 29.0-29.9, adult: Secondary | ICD-10-CM

## 2022-05-14 DIAGNOSIS — R0602 Shortness of breath: Secondary | ICD-10-CM

## 2022-05-14 DIAGNOSIS — E559 Vitamin D deficiency, unspecified: Secondary | ICD-10-CM | POA: Diagnosis not present

## 2022-05-14 DIAGNOSIS — R7303 Prediabetes: Secondary | ICD-10-CM | POA: Diagnosis not present

## 2022-05-14 DIAGNOSIS — E669 Obesity, unspecified: Secondary | ICD-10-CM

## 2022-05-14 DIAGNOSIS — D508 Other iron deficiency anemias: Secondary | ICD-10-CM | POA: Diagnosis not present

## 2022-05-14 MED ORDER — WEGOVY 2.4 MG/0.75ML ~~LOC~~ SOAJ
2.4000 mg | SUBCUTANEOUS | 0 refills | Status: DC
  Filled 2022-05-14 – 2022-06-09 (×2): qty 3, 28d supply, fill #0

## 2022-05-18 ENCOUNTER — Other Ambulatory Visit: Payer: Self-pay

## 2022-05-19 ENCOUNTER — Other Ambulatory Visit (HOSPITAL_COMMUNITY): Payer: Self-pay

## 2022-05-19 LAB — COMPREHENSIVE METABOLIC PANEL
ALT: 23 IU/L (ref 0–32)
AST: 21 IU/L (ref 0–40)
Albumin/Globulin Ratio: 1.8 (ref 1.2–2.2)
Albumin: 4.2 g/dL (ref 3.9–4.9)
Alkaline Phosphatase: 73 IU/L (ref 44–121)
BUN/Creatinine Ratio: 23 (ref 9–23)
BUN: 15 mg/dL (ref 6–24)
Bilirubin Total: 0.3 mg/dL (ref 0.0–1.2)
CO2: 22 mmol/L (ref 20–29)
Calcium: 8.9 mg/dL (ref 8.7–10.2)
Chloride: 107 mmol/L — ABNORMAL HIGH (ref 96–106)
Creatinine, Ser: 0.65 mg/dL (ref 0.57–1.00)
Globulin, Total: 2.4 g/dL (ref 1.5–4.5)
Glucose: 85 mg/dL (ref 70–99)
Potassium: 4.8 mmol/L (ref 3.5–5.2)
Sodium: 140 mmol/L (ref 134–144)
Total Protein: 6.6 g/dL (ref 6.0–8.5)
eGFR: 109 mL/min/{1.73_m2} (ref >59)

## 2022-05-19 LAB — ANEMIA PANEL
Ferritin: 7 ng/mL — ABNORMAL LOW (ref 15–150)
Folate, Hemolysate: 357 ng/mL
Folate, RBC: 1270 ng/mL (ref >498)
Hematocrit: 28.1 % — ABNORMAL LOW (ref 34.0–46.6)
Iron Saturation: 29 % (ref 15–55)
Iron: 116 ug/dL (ref 27–159)
Retic Ct Pct: 2.2 % (ref 0.6–2.6)
Total Iron Binding Capacity: 398 ug/dL (ref 250–450)
UIBC: 282 ug/dL (ref 131–425)
Vitamin B-12: 734 pg/mL (ref 232–1245)

## 2022-05-19 LAB — CBC WITH DIFFERENTIAL/PLATELET
Basophils Absolute: 0 10*3/uL (ref 0.0–0.2)
Basos: 0 %
EOS (ABSOLUTE): 0.1 10*3/uL (ref 0.0–0.4)
Eos: 2 %
Hemoglobin: 9 g/dL — ABNORMAL LOW (ref 11.1–15.9)
Immature Grans (Abs): 0 10*3/uL (ref 0.0–0.1)
Immature Granulocytes: 1 %
Lymphocytes Absolute: 1.6 10*3/uL (ref 0.7–3.1)
Lymphs: 33 %
MCH: 27.9 pg (ref 26.6–33.0)
MCHC: 32 g/dL (ref 31.5–35.7)
MCV: 87 fL (ref 79–97)
Monocytes Absolute: 0.3 10*3/uL (ref 0.1–0.9)
Monocytes: 7 %
Neutrophils Absolute: 2.7 10*3/uL (ref 1.4–7.0)
Neutrophils: 57 %
Platelets: 234 10*3/uL (ref 150–450)
RBC: 3.23 x10E6/uL — ABNORMAL LOW (ref 3.77–5.28)
RDW: 14.3 % (ref 11.7–15.4)
WBC: 4.8 10*3/uL (ref 3.4–10.8)

## 2022-05-19 LAB — INSULIN, RANDOM: INSULIN: 18.4 u[IU]/mL (ref 2.6–24.9)

## 2022-05-19 LAB — VITAMIN D 25 HYDROXY (VIT D DEFICIENCY, FRACTURES): Vit D, 25-Hydroxy: 36.6 ng/mL (ref 30.0–100.0)

## 2022-05-19 LAB — HEMOGLOBIN A1C
Est. average glucose Bld gHb Est-mCnc: 105 mg/dL
Hgb A1c MFr Bld: 5.3 % (ref 4.8–5.6)

## 2022-05-26 NOTE — Progress Notes (Signed)
Chief Complaint:   OBESITY Angela [name] is here to discuss her progress with her obesity treatment plan along with follow-up of her obesity related diagnoses. [name] is on the Category 4 Plan and states she is following her eating plan approximately 80-90% of the time. [name] states she is going to gym 45 minutes 2 times per week.  Today's visit was #: 61 Starting weight: 220 lbs Starting date: 10/05/2018 Today's weight: 193 lbs Today's date: 05/14/2022 Total lbs lost to date: 27 lbs Total lbs lost since last in-office visit: 0  Interim History: Angela [name] went to [place] for the holiday and recognizes she was more indulgent than she would be at home.  Once she got home she restarted back on meal plan.  Subjective:   1. SOBOE (shortness of breath on exertion) IC done on 11/01/19 with RMR of 2238.  Symptoms unchanged from that time.  2. Vitamin D deficiency Last Vit D level at goal.  Notes fatigue.  3. Other iron deficiency anemia Angela [name] is on Fusion Plus.  Iron at goal last draw.  4. Prediabetes Last A1c 5.3, insulin 18.9.  On GLP-1.  Assessment/Plan:   1. SOBOE (shortness of breath on exertion) IC today showing RMR of 1771- decreased to Cat 3.  2. Vitamin D deficiency We will obtain labs today.  - VITAMIN D 25 Hydroxy (Vit-D Deficiency, Fractures)  3. Other iron deficiency anemia We will obtain labs today.  - CBC w/Diff/Platelet - Anemia panel  4. Prediabetes We will obtain labs today.  - Comprehensive metabolic panel - Hemoglobin A1c - Insulin, random  5. Obesity with current BMI of 29.4 Will refill Wegovy 2.4 mg once a week for 1 month with 0 refills.  -Refill Semaglutide-Weight Management (WEGOVY) 2.4 MG/0.75ML SOAJ; Inject 2.4 mg into the skin once a week.  Dispense: 9 mL; Refill: 0  Angela [name] is currently in the action stage of change. As such, her goal is to continue with weight loss efforts. She has agreed to the Category 3 Plan.   Exercise goals: All  adults should avoid inactivity. Some physical activity is better than none, and adults who participate in any amount of physical activity gain some health benefits.  Behavioral modification strategies: increasing lean protein intake, meal planning and cooking strategies, keeping healthy foods in the home, and planning for success.  Angela [name] has agreed to follow-up with our clinic in 4 weeks. She was informed of the importance of frequent follow-up visits to maximize her success with intensive lifestyle modifications for her multiple health conditions.   Angela [name] was informed we would discuss her lab results at her next visit unless there is a critical issue that needs to be addressed sooner. Angela [name] agreed to keep her next visit at the agreed upon time to discuss these results.  Objective:   Blood pressure 118/74, pulse 97, temperature 98.4 F (36.9 C), height 5' 8"$  (1.727 m), weight 193 lb (87.5 kg), last menstrual period 04/22/2022, SpO2 99 %. Body mass index is 29.35 kg/m.  General: Cooperative, alert, well developed, in no acute distress. HEENT: Conjunctivae and lids unremarkable. Cardiovascular: Regular rhythm.  Lungs: Normal work of breathing. Neurologic: No focal deficits.   Lab Results  Component Value Date   CREATININE 0.65 05/14/2022   BUN 15 05/14/2022   NA 140 05/14/2022   K 4.8 05/14/2022   CL 107 (H) 05/14/2022   CO2 22 05/14/2022   Lab Results  Component Value Date   ALT 23 05/14/2022   AST  21 05/14/2022   ALKPHOS 73 05/14/2022   BILITOT 0.3 05/14/2022   Lab Results  Component Value Date   HGBA1C 5.3 05/14/2022   HGBA1C 5.5 05/13/2021   HGBA1C 5.6 10/30/2020   HGBA1C 5.5 11/01/2019   HGBA1C 5.5 05/25/2019   Lab Results  Component Value Date   INSULIN 18.4 05/14/2022   INSULIN 18.9 05/13/2021   INSULIN 24.4 11/01/2019   INSULIN 24.5 05/25/2019   INSULIN 21.9 02/02/2019   Lab Results  Component Value Date   TSH 2.020 10/05/2018   Lab Results   Component Value Date   CHOL 144 10/30/2020   HDL 55.50 10/30/2020   LDLCALC 72 10/30/2020   TRIG 81.0 10/30/2020   CHOLHDL 3 10/30/2020   Lab Results  Component Value Date   VD25OH 36.6 05/14/2022   VD25OH 91.7 05/13/2021   VD25OH 29.02 (L) 10/30/2020   Lab Results  Component Value Date   WBC 4.8 05/14/2022   HGB 9.0 (L) 05/14/2022   HCT 28.1 (L) 05/14/2022   MCV 87 05/14/2022   PLT 234 05/14/2022   Lab Results  Component Value Date   IRON 116 05/14/2022   TIBC 398 05/14/2022   FERRITIN 7 (L) 05/14/2022   Attestation Statements:   Reviewed by clinician on day of visit: allergies, medications, problem list, medical history, surgical history, family history, social history, and previous encounter notes.  I, Elnora Morrison, RMA am acting as transcriptionist for Coralie Common, MD.  I have reviewed the above documentation for accuracy and completeness, and I agree with the above. - Coralie Common, MD

## 2022-06-02 ENCOUNTER — Encounter (INDEPENDENT_AMBULATORY_CARE_PROVIDER_SITE_OTHER): Payer: Self-pay | Admitting: Family Medicine

## 2022-06-02 ENCOUNTER — Ambulatory Visit (INDEPENDENT_AMBULATORY_CARE_PROVIDER_SITE_OTHER): Payer: 59 | Admitting: Family Medicine

## 2022-06-02 VITALS — BP 128/78 | HR 86 | Temp 98.4°F | Ht 68.0 in | Wt 192.0 lb

## 2022-06-02 DIAGNOSIS — Z6829 Body mass index (BMI) 29.0-29.9, adult: Secondary | ICD-10-CM | POA: Diagnosis not present

## 2022-06-02 DIAGNOSIS — D508 Other iron deficiency anemias: Secondary | ICD-10-CM

## 2022-06-02 DIAGNOSIS — R7303 Prediabetes: Secondary | ICD-10-CM | POA: Diagnosis not present

## 2022-06-02 DIAGNOSIS — E669 Obesity, unspecified: Secondary | ICD-10-CM

## 2022-06-02 NOTE — Progress Notes (Deleted)
Patient has been following Cat 3 around 60-70% of the time.  Other time she is trying to stay mindful of food choices and quantity.  Breakfast is on plan.  Lunch is mainly in plan.  Dinner is occasionally eating out or ordering take out.  She voices that it is not every night that she is eating out but occasionally.  Going out of town for a concert tomorrow for an overnight.  No visitors or other travel or activities.  Activity wise she is focusing on strength training while at home with the bands and body weight.

## 2022-06-09 ENCOUNTER — Encounter (INDEPENDENT_AMBULATORY_CARE_PROVIDER_SITE_OTHER): Payer: Self-pay | Admitting: Family Medicine

## 2022-06-09 ENCOUNTER — Other Ambulatory Visit (HOSPITAL_COMMUNITY): Payer: Self-pay

## 2022-06-10 ENCOUNTER — Other Ambulatory Visit (HOSPITAL_COMMUNITY): Payer: Self-pay

## 2022-06-15 NOTE — Progress Notes (Signed)
Chief Complaint:   OBESITY Angela [name] is here to discuss her progress with her obesity treatment plan along with follow-up of her obesity related diagnoses. Angela [name] is on the Category 3 Plan and states she is following her eating plan approximately 60-70% of the time. Angela [name] states she is exercising 30 minutes 2 times per week.  Today's visit was #: 70 Starting weight: 220 lbs Starting date: 10/05/2018 Today's weight: 192 lbs Today's date: 06/02/2022 Total lbs lost to date: 28 lbs Total lbs lost since last in-office visit: 1  Interim History: Angela [name] has been following Cat 3 around 60-70% of the time.  Other time she is trying to stay mindful of food choices and quantity.  Breakfast is on plan.  Lunch is mainly in plan.  Dinner is occasionally eating out or ordering take out.  She voices that it is not every night that she is eating out but occasionally.  Going out of town for a concert tomorrow for an overnight.  No visitors or other travel or activities.  Activity wise she is focusing on strength training while at home with the bands and body weight.    Subjective:   1. Other iron deficiency anemia Labs discussed during visit today.  CBC showing decrease RBC, decrease H/H and decrease Ferritin RDW within normal limits.  On Fusion plus.  Asked about IV iron.    2. Prediabetes Last A1c within normal limits, Insulin level of 18.4.  On GLP-1.    Assessment/Plan:   1. Other iron deficiency anemia Continue Fusion Plus.  Repeat lab ordered.  Will refer to Hematology/Oncology for further evaluation.  - Ambulatory referral to Hematology / Oncology  2. Prediabetes Will repeat labs in 3 months.     3. Obesity with current BMI of 29.2 Angela [name] is currently in the action stage of change. As such, her goal is to continue with weight loss efforts. She has agreed to the Category 3 Plan.   Exercise goals: All adults should avoid inactivity. Some physical activity is better than none, and adults  who participate in any amount of physical activity gain some health benefits.  Behavioral modification strategies: increasing lean protein intake, meal planning and cooking strategies, keeping healthy foods in the home, and planning for success.  Angela [name] has agreed to follow-up with our clinic in 5 weeks. She was informed of the importance of frequent follow-up visits to maximize her success with intensive lifestyle modifications for her multiple health conditions.   Objective:   Blood pressure 128/78, pulse 86, temperature 98.4 F (36.9 C), height '5\' 8"'$  (1.727 m), weight 192 lb (87.1 kg), last menstrual period 04/22/2022, SpO2 100 %. Body mass index is 29.19 kg/m.  General: Cooperative, alert, well developed, in no acute distress. HEENT: Conjunctivae and lids unremarkable. Cardiovascular: Regular rhythm.  Lungs: Normal work of breathing. Neurologic: No focal deficits.   Lab Results  Component Value Date   CREATININE 0.65 05/14/2022   BUN 15 05/14/2022   NA 140 05/14/2022   K 4.8 05/14/2022   CL 107 (H) 05/14/2022   CO2 22 05/14/2022   Lab Results  Component Value Date   ALT 23 05/14/2022   AST 21 05/14/2022   ALKPHOS 73 05/14/2022   BILITOT 0.3 05/14/2022   Lab Results  Component Value Date   HGBA1C 5.3 05/14/2022   HGBA1C 5.5 05/13/2021   HGBA1C 5.6 10/30/2020   HGBA1C 5.5 11/01/2019   HGBA1C 5.5 05/25/2019   Lab Results  Component Value Date  INSULIN 18.4 05/14/2022   INSULIN 18.9 05/13/2021   INSULIN 24.4 11/01/2019   INSULIN 24.5 05/25/2019   INSULIN 21.9 02/02/2019   Lab Results  Component Value Date   TSH 2.020 10/05/2018   Lab Results  Component Value Date   CHOL 144 10/30/2020   HDL 55.50 10/30/2020   LDLCALC 72 10/30/2020   TRIG 81.0 10/30/2020   CHOLHDL 3 10/30/2020   Lab Results  Component Value Date   VD25OH 36.6 05/14/2022   VD25OH 91.7 05/13/2021   VD25OH 29.02 (L) 10/30/2020   Lab Results  Component Value Date   WBC 4.8 05/14/2022    HGB 9.0 (L) 05/14/2022   HCT 28.1 (L) 05/14/2022   MCV 87 05/14/2022   PLT 234 05/14/2022   Lab Results  Component Value Date   IRON 116 05/14/2022   TIBC 398 05/14/2022   FERRITIN 7 (L) 05/14/2022   Attestation Statements:   Reviewed by clinician on day of visit: allergies, medications, problem list, medical history, surgical history, family history, social history, and previous encounter notes.  I, Elnora Morrison, RMA am acting as transcriptionist for Coralie Common, MD.  I have reviewed the above documentation for accuracy and completeness, and I agree with the above. - Coralie Common, MD

## 2022-06-16 ENCOUNTER — Inpatient Hospital Stay: Payer: 59

## 2022-06-16 ENCOUNTER — Encounter: Payer: Self-pay | Admitting: Internal Medicine

## 2022-06-16 ENCOUNTER — Inpatient Hospital Stay: Payer: 59 | Attending: Internal Medicine | Admitting: Internal Medicine

## 2022-06-16 VITALS — BP 130/69 | HR 92 | Temp 98.4°F | Resp 18 | Ht 69.5 in | Wt 196.2 lb

## 2022-06-16 DIAGNOSIS — D509 Iron deficiency anemia, unspecified: Secondary | ICD-10-CM | POA: Diagnosis present

## 2022-06-16 DIAGNOSIS — Z803 Family history of malignant neoplasm of breast: Secondary | ICD-10-CM | POA: Diagnosis not present

## 2022-06-16 DIAGNOSIS — Z800 Family history of malignant neoplasm of digestive organs: Secondary | ICD-10-CM | POA: Diagnosis not present

## 2022-06-16 DIAGNOSIS — D649 Anemia, unspecified: Secondary | ICD-10-CM

## 2022-06-16 DIAGNOSIS — N920 Excessive and frequent menstruation with regular cycle: Secondary | ICD-10-CM | POA: Diagnosis not present

## 2022-06-16 LAB — CBC WITH DIFFERENTIAL/PLATELET
Abs Immature Granulocytes: 0.01 10*3/uL (ref 0.00–0.07)
Basophils Absolute: 0 10*3/uL (ref 0.0–0.1)
Basophils Relative: 0 %
Eosinophils Absolute: 0.1 10*3/uL (ref 0.0–0.5)
Eosinophils Relative: 2 %
HCT: 28 % — ABNORMAL LOW (ref 36.0–46.0)
Hemoglobin: 8.8 g/dL — ABNORMAL LOW (ref 12.0–15.0)
Immature Granulocytes: 0 %
Lymphocytes Relative: 34 %
Lymphs Abs: 1.9 10*3/uL (ref 0.7–4.0)
MCH: 25.9 pg — ABNORMAL LOW (ref 26.0–34.0)
MCHC: 31.4 g/dL (ref 30.0–36.0)
MCV: 82.4 fL (ref 80.0–100.0)
Monocytes Absolute: 0.4 10*3/uL (ref 0.1–1.0)
Monocytes Relative: 7 %
Neutro Abs: 3.2 10*3/uL (ref 1.7–7.7)
Neutrophils Relative %: 57 %
Platelets: 255 10*3/uL (ref 150–400)
RBC: 3.4 MIL/uL — ABNORMAL LOW (ref 3.87–5.11)
RDW: 16.3 % — ABNORMAL HIGH (ref 11.5–15.5)
WBC: 5.7 10*3/uL (ref 4.0–10.5)
nRBC: 0 % (ref 0.0–0.2)

## 2022-06-16 LAB — RETICULOCYTES
Immature Retic Fract: 21.9 % — ABNORMAL HIGH (ref 2.3–15.9)
RBC.: 3.44 MIL/uL — ABNORMAL LOW (ref 3.87–5.11)
Retic Count, Absolute: 48.2 10*3/uL (ref 19.0–186.0)
Retic Ct Pct: 1.4 % (ref 0.4–3.1)

## 2022-06-16 LAB — PREGNANCY, URINE: Preg Test, Ur: NEGATIVE

## 2022-06-16 LAB — VITAMIN B12: Vitamin B-12: 455 pg/mL (ref 180–914)

## 2022-06-16 LAB — IRON AND TIBC
Iron: 46 ug/dL (ref 28–170)
Saturation Ratios: 10 % — ABNORMAL LOW (ref 10.4–31.8)
TIBC: 442 ug/dL (ref 250–450)
UIBC: 396 ug/dL

## 2022-06-16 LAB — LACTATE DEHYDROGENASE: LDH: 97 U/L — ABNORMAL LOW (ref 98–192)

## 2022-06-16 LAB — FERRITIN: Ferritin: 11 ng/mL (ref 11–307)

## 2022-06-16 LAB — FOLATE: Folate: 13.8 ng/mL (ref >5.9)

## 2022-06-16 NOTE — Progress Notes (Signed)
[place] NOTE  Patient Care Team: Pleas Koch, NP as PCP - General (Internal Medicine) Will Bonnet, MD as Attending Physician (Obstetrics and Gynecology) Laqueta Linden, MD as Consulting Physician (Family Medicine)  CHIEF COMPLAINTS/PURPOSE OF CONSULTATION: ANEMIA   HEMATOLOGY HISTORY:   # Chronic iron deficient anemia JAN 2024-ferritin 6; EGD/colonoscopy/capsule 2023- NEG  # Heavy menstrual cycles  # Liver disease: fatty liver.  Hx of ERCP   Latest Reference Range & Units 05/14/22 10:14  Iron 27 - 159 ug/dL 116  UIBC 131 - 425 ug/dL 282  TIBC 250 - 450 ug/dL 398  Ferritin 15 - 150 ng/mL 7 (L)  Iron Saturation 15 - 55 % 29  (L): Data is abnormally low   Latest Reference Range & Units 05/14/22 10:14  Hemoglobin 11.1 - 15.9 g/dL 9.0 (L)  HCT 34.0 - 46.6 % 28.1 (L)  MCV 79 - 97 fL 87  (L): Data is abnormally low    HISTORY OF PRESENTING ILLNESS:  Angela [name] 47 y.o.  female pleasant patient was been referred to [place] for further evaluation of anemia.   Pt feels more tired lately. Wakes up with energy and by 2 hours she is tired. Goes to bed by 8 pm. Her menstral cycle is lasting 15 days or so and is heavy. Today she is on day 6 with cramping. Occ lightheadedness. Dyspnea with a lot exertion.    Blood in stools: none; colo- 2023; EGD-; capsule- NEG.  Blood in urine: none Difficulty swallowing:none Change of bowel movement/constipation:none Prior blood transfusion: none Prior history of blood loss: none Liver disease: fatty liver.  Hx of ERCP Alcohol: rare Bariatric surgery:none Vaginal bleeding: heavy menstrual cycles- s/p eval with Gyn, Dr.Jackson.  Prior evaluation with hematology: in 2012.  Prior bone marrow biopsy: none Oral iron: yes, intermittent constipation Prior IV iron infusions: none  Review of Systems  Constitutional:  Positive for malaise/fatigue. Negative for chills, diaphoresis, fever and weight  loss.  HENT:  Negative for nosebleeds and sore throat.   Eyes:  Negative for double vision.  Respiratory:  Positive for shortness of breath. Negative for cough, hemoptysis, sputum production and wheezing.   Cardiovascular:  Negative for chest pain, palpitations, orthopnea and leg swelling.  Gastrointestinal:  Negative for abdominal pain, blood in stool, constipation, diarrhea, heartburn, melena, nausea and vomiting.  Genitourinary:  Negative for dysuria, frequency and urgency.  Musculoskeletal:  Negative for back pain and joint pain.  Skin: Negative.  Negative for itching and rash.  Neurological:  Positive for dizziness. Negative for tingling, focal weakness, weakness and headaches.  Endo/Heme/Allergies:  Does not bruise/bleed easily.  Psychiatric/Behavioral:  Negative for depression. The patient is not nervous/anxious and does not have insomnia.      MEDICAL HISTORY:  Past Medical History:  Diagnosis Date   Allergy Enviromental   Anemia 08/2015   Asthma    as a child   Back pain gerd   Constipation    Dysuria 11/25/2018   Glaucoma    Heart murmur childhood   IBS (irritable bowel syndrome)    Joint pain    Menorrhagia    Palpitations    Plantar fasciitis    Prediabetes    Swelling    feet, legs   Torn meniscus    Right   Vaginal burning 11/25/2018   Voice hoarseness 07/04/2018    SURGICAL HISTORY: Past Surgical History:  Procedure Laterality Date   CHOLECYSTECTOMY  2005   COLONOSCOPY WITH PROPOFOL N/A  10/27/2018   Procedure: COLONOSCOPY WITH PROPOFOL;  Surgeon: Lin Landsman, MD;  Location: [facility] ENDOSCOPY;  Service: Gastroenterology;  Laterality: N/A;   COLONOSCOPY WITH PROPOFOL N/A 10/31/2021   Procedure: COLONOSCOPY WITH PROPOFOL;  Surgeon: Lin Landsman, MD;  Location: [facility] ENDOSCOPY;  Service: Gastroenterology;  Laterality: N/A;   DILATATION & CURETTAGE/HYSTEROSCOPY WITH MYOSURE N/A 06/23/2018   Procedure: DILATATION & CURETTAGE/HYSTEROSCOPY WITH MYOSURE;   Surgeon: Will Bonnet, MD;  Location: [facility] ORS;  Service: Gynecology;  Laterality: N/A;   DILATION AND CURETTAGE OF UTERUS     ERCP     3 times   ESOPHAGOGASTRODUODENOSCOPY (EGD) WITH PROPOFOL  10/27/2018   Procedure: ESOPHAGOGASTRODUODENOSCOPY (EGD) WITH PROPOFOL;  Surgeon: Lin Landsman, MD;  Location: [facility] ENDOSCOPY;  Service: Gastroenterology;;   EYE SURGERY     GIVENS CAPSULE STUDY N/A 12/11/2019   Procedure: GIVENS CAPSULE STUDY;  Surgeon: Lin Landsman, MD;  Location: [facility] ENDOSCOPY;  Service: Gastroenterology;  Laterality: N/A;   MENISCUS REPAIR Right    x 2    SOCIAL HISTORY: Social History   Socioeconomic History   Marital status: Married    Spouse name: Financial planner   Number of children: 0   Years of education: Not on file   Highest education level: Not on file  Occupational History   Occupation: Agricultural consultant: [employer]  Tobacco Use   Smoking status: Never   Smokeless tobacco: Never  Vaping Use   Vaping Use: Never used  Substance and Sexual Activity   Alcohol use: Yes    Alcohol/week: 2.0 standard drinks of alcohol    Types: 2 Glasses of wine per week    Comment: occasional   Drug use: No   Sexual activity: Yes    Birth control/protection: None  Other Topics Concern   Not on file  Social History Narrative   Married.   No children.   Works in the [employer].   Enjoys watching movies, walking, reading.    Social Determinants of Health   Financial Resource Strain: Not on file  Food Insecurity: No Food Insecurity (06/16/2022)   Hunger Vital Sign    Worried About Running Out of Food in the Last Year: Never true    Ran Out of Food in the Last Year: Never true  Transportation Needs: No Transportation Needs (06/16/2022)   PRAPARE - Hydrologist (Medical): No    Lack of Transportation (Non-Medical): No  Physical Activity: Unknown (04/29/2018)   Exercise Vital Sign    Days of Exercise per  Week: 0 days    Minutes of Exercise per Session: Not on file  Stress: Not on file  Social Connections: Not on file  Intimate Partner Violence: Not At Risk (06/16/2022)   Humiliation, Afraid, Rape, and Kick questionnaire    Fear of Current or Ex-Partner: No    Emotionally Abused: No    Physically Abused: No    Sexually Abused: No    FAMILY HISTORY: Family History  Problem Relation Age of Onset   Colon cancer Mother 75   Diabetes Mother    Hypertension Mother    Arthritis Mother    Transient ischemic attack Mother    Liver disease Mother    Obesity Mother    Diabetes Father    Heart disease Father    Breast cancer Maternal Aunt        mat great aunt   Esophageal cancer Maternal Grandmother    Cancer  Maternal Grandmother    Diabetes Maternal Grandmother    Alcohol abuse Maternal Grandfather     ALLERGIES:  is allergic to quinoa-kale-hemp [alimentum].  MEDICATIONS:  Current Outpatient Medications  Medication Sig Dispense Refill   atropine 1 % ophthalmic solution Place 1 drop into the right eye daily. 5 mL 11   brimonidine-timolol (COMBIGAN) 0.2-0.5 % ophthalmic solution Place 1 drop into the right eye every 12 (twelve) hours. 10 mL 11   cetirizine (ZYRTEC) 10 MG tablet Take 10 mg by mouth daily as needed for allergies.     Cholecalciferol (VITAMIN D3) 50 MCG (2000 UT) capsule Take 1 capsule (2,000 Units total) by mouth daily.     ibuprofen (ADVIL,MOTRIN) 600 MG tablet Take 1 tablet (600 mg total) by mouth every 6 (six) hours as needed for mild pain, moderate pain or cramping. 30 tablet 0   Iron-FA-B Cmp-C-Biot-Probiotic (FUSION PLUS) CAPS Take 1 capsule by mouth daily. 90 capsule 0   linaclotide (LINZESS) 145 MCG CAPS capsule TAKE 1 CAPSULE BY MOUTH ONCE DAILY BEFORE BREAKFAST 90 capsule 3   psyllium (METAMUCIL) 58.6 % packet Take 1 packet by mouth daily.     Semaglutide-Weight Management (WEGOVY) 2.4 MG/0.75ML SOAJ Inject 2.4 mg into the skin once a week. 9 mL 0   No  current facility-administered medications for this visit.     Marland Kitchen  PHYSICAL EXAMINATION:   Vitals:   06/16/22 1403  BP: 130/69  Pulse: 92  Resp: 18  Temp: 98.4 F (36.9 C)  SpO2: 100%   Filed Weights   06/16/22 1403  Weight: 196 lb 3.2 oz (89 kg)    Physical Exam Vitals and nursing note reviewed.  HENT:     Head: Normocephalic and atraumatic.     Mouth/Throat:     Pharynx: Oropharynx is clear.  Eyes:     Extraocular Movements: Extraocular movements intact.     Pupils: Pupils are equal, round, and reactive to light.  Cardiovascular:     Rate and Rhythm: Normal rate and regular rhythm.  Pulmonary:     Comments: Decreased breath sounds bilaterally.  Abdominal:     Palpations: Abdomen is soft.  Musculoskeletal:        General: Normal range of motion.     Cervical back: Normal range of motion.  Skin:    General: Skin is warm.  Neurological:     General: No focal deficit present.     Mental Status: She is alert and oriented to person, place, and time.  Psychiatric:        Behavior: Behavior normal.        Judgment: Judgment normal.      LABORATORY DATA:  I have reviewed the data as listed Lab Results  Component Value Date   WBC 5.7 06/16/2022   HGB 8.8 (L) 06/16/2022   HCT 28.0 (L) 06/16/2022   MCV 82.4 06/16/2022   PLT 255 06/16/2022   Recent Labs    05/14/22 1014  NA 140  K 4.8  CL 107*  CO2 22  GLUCOSE 85  BUN 15  CREATININE 0.65  CALCIUM 8.9  PROT 6.6  ALBUMIN 4.2  AST 21  ALT 23  ALKPHOS 73  BILITOT 0.3     No results found.  ASSESSMENT & PLAN:   Symptomatic anemia # Anemia- Hb-symptomatic.  Likely due to iron deficiency - from etiology menorrhagia. Given lack of significant improvement on oral iron- Biglycinate- recommend IV iron.    # Discussed regarding IV iron infusion/Venofer.  Discussed the potential acute infusion reactions with IV iron; which are quite rare.  Patient understands the risk; will proceed with infusions.    #Etiology of iron deficiency: Likely secondary to menorrhagia.  EGD colonoscopy capsule- ? 2023- NEG.  Thank you Ms.Clark  for allowing me to participate in the care of your pleasant patient. Please do not hesitate to contact me with questions or concerns in the interim.  # DISPOSITION: # labs today- ordered  # venofer weekly x 3- start asap  # follow up in 2  months MD: labs- cbc; possible venofer-Dr.B   All questions were answered. The patient knows to call the clinic with any problems, questions or concerns.   Cammie Sickle, MD 06/16/2022 3:47 PM

## 2022-06-16 NOTE — Progress Notes (Signed)
Pt feels more tired lately. Wakes up with energy and by 2 hours she is tired. Goes to bed by 8 pm. Her menstral cycle is lasting 15 days or so and is heavy. Today she is on day 6 with cramping. Occ lightheadedness. Dyspnea with a lot exertion.

## 2022-06-16 NOTE — Assessment & Plan Note (Addendum)
#   Anemia- Hb-symptomatic.  Likely due to iron deficiency - from etiology menorrhagia. Given lack of significant improvement on oral iron- Biglycinate- recommend IV iron.    # Discussed regarding IV iron infusion/Venofer. Discussed the potential acute infusion reactions with IV iron; which are quite rare.  Patient understands the risk; will proceed with infusions.   #Etiology of iron deficiency: Likely secondary to menorrhagia.  EGD colonoscopy capsule- ? 2023- NEG.  Thank you Ms.Clark  for allowing me to participate in the care of your pleasant patient. Please do not hesitate to contact me with questions or concerns in the interim.  # DISPOSITION: # labs today- ordered  # venofer weekly x 3- start asap  # follow up in 2  months MD: labs- cbc; possible venofer-Dr.B

## 2022-06-17 ENCOUNTER — Encounter (INDEPENDENT_AMBULATORY_CARE_PROVIDER_SITE_OTHER): Payer: Self-pay | Admitting: Family Medicine

## 2022-06-17 ENCOUNTER — Other Ambulatory Visit: Payer: Self-pay

## 2022-06-17 ENCOUNTER — Other Ambulatory Visit: Payer: Self-pay | Admitting: Gastroenterology

## 2022-06-17 DIAGNOSIS — K5909 Other constipation: Secondary | ICD-10-CM

## 2022-06-17 MED ORDER — LINACLOTIDE 145 MCG PO CAPS
145.0000 ug | ORAL_CAPSULE | Freq: Every day | ORAL | 1 refills | Status: DC
  Filled 2022-06-17 – 2022-07-28 (×2): qty 30, 30d supply, fill #0

## 2022-06-18 ENCOUNTER — Ambulatory Visit
Admission: RE | Admit: 2022-06-18 | Discharge: 2022-06-18 | Disposition: A | Discharge status: Home / Self Care | Payer: 59 | Source: Ambulatory Visit | Attending: Primary Care | Admitting: Primary Care

## 2022-06-18 ENCOUNTER — Other Ambulatory Visit (INDEPENDENT_AMBULATORY_CARE_PROVIDER_SITE_OTHER): Payer: Self-pay | Admitting: Family Medicine

## 2022-06-18 ENCOUNTER — Other Ambulatory Visit (HOSPITAL_COMMUNITY): Payer: Self-pay

## 2022-06-18 DIAGNOSIS — N6310 Unspecified lump in the right breast, unspecified quadrant: Secondary | ICD-10-CM

## 2022-06-18 DIAGNOSIS — R922 Inconclusive mammogram: Secondary | ICD-10-CM | POA: Diagnosis not present

## 2022-06-18 DIAGNOSIS — E669 Obesity, unspecified: Secondary | ICD-10-CM

## 2022-06-18 MED ORDER — WEGOVY 2.4 MG/0.75ML ~~LOC~~ SOAJ
2.4000 mg | SUBCUTANEOUS | 0 refills | Status: DC
  Filled 2022-06-18: qty 9, 84d supply, fill #0
  Filled 2022-07-08 – 2022-07-10 (×2): qty 3, 28d supply, fill #0

## 2022-06-19 ENCOUNTER — Other Ambulatory Visit (HOSPITAL_COMMUNITY): Payer: Self-pay

## 2022-06-19 ENCOUNTER — Other Ambulatory Visit: Payer: Self-pay

## 2022-06-19 ENCOUNTER — Inpatient Hospital Stay: Payer: 59 | Attending: Internal Medicine

## 2022-06-19 VITALS — BP 135/81 | HR 95 | Temp 97.9°F | Resp 16

## 2022-06-19 DIAGNOSIS — D649 Anemia, unspecified: Secondary | ICD-10-CM

## 2022-06-19 DIAGNOSIS — Z79899 Other long term (current) drug therapy: Secondary | ICD-10-CM | POA: Diagnosis not present

## 2022-06-19 DIAGNOSIS — D509 Iron deficiency anemia, unspecified: Secondary | ICD-10-CM | POA: Insufficient documentation

## 2022-06-19 MED ORDER — SODIUM CHLORIDE 0.9 % IV SOLN
200.0000 mg | Freq: Once | INTRAVENOUS | Status: AC
  Administered 2022-06-19: 200 mg via INTRAVENOUS
  Filled 2022-06-19: qty 200

## 2022-06-19 MED ORDER — SODIUM CHLORIDE 0.9 % IV SOLN
Freq: Once | INTRAVENOUS | Status: AC
  Filled 2022-06-19: qty 250

## 2022-06-20 ENCOUNTER — Other Ambulatory Visit (HOSPITAL_COMMUNITY): Payer: Self-pay

## 2022-06-20 ENCOUNTER — Encounter (INDEPENDENT_AMBULATORY_CARE_PROVIDER_SITE_OTHER): Payer: 59

## 2022-06-20 DIAGNOSIS — Z2989 Encounter for other specified prophylactic measures: Secondary | ICD-10-CM

## 2022-06-20 DIAGNOSIS — Z0184 Encounter for antibody response examination: Secondary | ICD-10-CM

## 2022-06-22 ENCOUNTER — Other Ambulatory Visit: Payer: Self-pay

## 2022-06-22 DIAGNOSIS — Z7184 Encounter for health counseling related to travel: Secondary | ICD-10-CM | POA: Diagnosis not present

## 2022-06-23 ENCOUNTER — Other Ambulatory Visit: Payer: Self-pay

## 2022-06-23 ENCOUNTER — Other Ambulatory Visit (HOSPITAL_COMMUNITY): Payer: Self-pay

## 2022-06-23 ENCOUNTER — Telehealth: Payer: Self-pay | Admitting: Gastroenterology

## 2022-06-23 DIAGNOSIS — K5909 Other constipation: Secondary | ICD-10-CM

## 2022-06-23 MED ORDER — ATOVAQUONE-PROGUANIL HCL 250-100 MG PO TABS
ORAL_TABLET | ORAL | 0 refills | Status: DC
  Filled 2022-06-23: qty 16, 16d supply, fill #0

## 2022-06-23 NOTE — Telephone Encounter (Signed)
Pt left message to get refill on her medication Linzess she also wanted to schedule appointment I explained to her that  we will call her back when June schedule opens.

## 2022-06-23 NOTE — Telephone Encounter (Signed)

## 2022-06-23 NOTE — Telephone Encounter (Signed)
Called  and patient states she does not need a refill at this time on the Linzess. Asked if patient could come in 05/01 at 2:45 and she said she had to check her work calendar and then the phone got disconnected. Tried to call patient back and left a message for call back

## 2022-06-25 ENCOUNTER — Other Ambulatory Visit (HOSPITAL_COMMUNITY): Payer: Self-pay

## 2022-06-26 ENCOUNTER — Other Ambulatory Visit (INDEPENDENT_AMBULATORY_CARE_PROVIDER_SITE_OTHER): Payer: 59

## 2022-06-26 ENCOUNTER — Inpatient Hospital Stay: Payer: 59

## 2022-06-26 VITALS — BP 128/79 | HR 85 | Temp 97.3°F | Resp 18

## 2022-06-26 DIAGNOSIS — Z0184 Encounter for antibody response examination: Secondary | ICD-10-CM | POA: Diagnosis not present

## 2022-06-26 DIAGNOSIS — Z79899 Other long term (current) drug therapy: Secondary | ICD-10-CM | POA: Diagnosis not present

## 2022-06-26 DIAGNOSIS — D649 Anemia, unspecified: Secondary | ICD-10-CM

## 2022-06-26 DIAGNOSIS — D509 Iron deficiency anemia, unspecified: Secondary | ICD-10-CM | POA: Diagnosis not present

## 2022-06-26 MED ORDER — SODIUM CHLORIDE 0.9 % IV SOLN
200.0000 mg | Freq: Once | INTRAVENOUS | Status: AC
  Administered 2022-06-26: 200 mg via INTRAVENOUS
  Filled 2022-06-26: qty 200

## 2022-06-26 MED ORDER — SODIUM CHLORIDE 0.9 % IV SOLN
Freq: Once | INTRAVENOUS | Status: AC
  Filled 2022-06-26: qty 250

## 2022-06-26 NOTE — Patient Instructions (Signed)

## 2022-06-28 LAB — HEPATITIS B SURFACE ANTIBODY, QUANTITATIVE: Hep B S AB Quant (Post): 180 m[IU]/mL (ref >=10)

## 2022-06-29 ENCOUNTER — Other Ambulatory Visit (HOSPITAL_COMMUNITY): Payer: Self-pay

## 2022-06-30 ENCOUNTER — Other Ambulatory Visit (HOSPITAL_COMMUNITY): Payer: Self-pay

## 2022-06-30 MED ORDER — ATROPINE SULFATE 1 % OP SOLN
1.0000 [drp] | Freq: Every day | OPHTHALMIC | 11 refills | Status: DC
  Filled 2022-06-30 – 2022-09-23 (×2): qty 5, 90d supply, fill #0
  Filled 2022-12-24: qty 5, 75d supply, fill #1
  Filled 2023-03-18: qty 5, 75d supply, fill #2
  Filled 2023-06-03: qty 5, 75d supply, fill #3

## 2022-07-03 ENCOUNTER — Inpatient Hospital Stay: Payer: 59

## 2022-07-03 VITALS — BP 125/66 | HR 79 | Temp 98.2°F | Resp 18

## 2022-07-03 DIAGNOSIS — D649 Anemia, unspecified: Secondary | ICD-10-CM

## 2022-07-03 DIAGNOSIS — Z79899 Other long term (current) drug therapy: Secondary | ICD-10-CM | POA: Diagnosis not present

## 2022-07-03 DIAGNOSIS — D509 Iron deficiency anemia, unspecified: Secondary | ICD-10-CM | POA: Diagnosis not present

## 2022-07-03 MED ORDER — SODIUM CHLORIDE 0.9 % IV SOLN
Freq: Once | INTRAVENOUS | Status: AC
  Filled 2022-07-03: qty 250

## 2022-07-03 MED ORDER — SODIUM CHLORIDE 0.9 % IV SOLN
200.0000 mg | Freq: Once | INTRAVENOUS | Status: AC
  Administered 2022-07-03: 200 mg via INTRAVENOUS
  Filled 2022-07-03: qty 200

## 2022-07-03 NOTE — Patient Instructions (Signed)

## 2022-07-04 ENCOUNTER — Other Ambulatory Visit (HOSPITAL_COMMUNITY): Payer: Self-pay

## 2022-07-08 ENCOUNTER — Other Ambulatory Visit (HOSPITAL_COMMUNITY): Payer: Self-pay

## 2022-07-08 ENCOUNTER — Other Ambulatory Visit: Payer: Self-pay

## 2022-07-08 ENCOUNTER — Encounter: Payer: Self-pay | Admitting: Internal Medicine

## 2022-07-09 ENCOUNTER — Other Ambulatory Visit: Payer: Self-pay

## 2022-07-10 ENCOUNTER — Other Ambulatory Visit (HOSPITAL_COMMUNITY): Payer: Self-pay

## 2022-07-10 ENCOUNTER — Other Ambulatory Visit: Payer: Self-pay

## 2022-07-14 ENCOUNTER — Other Ambulatory Visit: Payer: Self-pay

## 2022-07-14 ENCOUNTER — Ambulatory Visit (INDEPENDENT_AMBULATORY_CARE_PROVIDER_SITE_OTHER): Payer: 59 | Admitting: Family Medicine

## 2022-07-14 ENCOUNTER — Encounter (INDEPENDENT_AMBULATORY_CARE_PROVIDER_SITE_OTHER): Payer: Self-pay | Admitting: Family Medicine

## 2022-07-14 VITALS — BP 125/74 | HR 36 | Temp 98.7°F | Ht 68.0 in | Wt 189.0 lb

## 2022-07-14 DIAGNOSIS — Z6833 Body mass index (BMI) 33.0-33.9, adult: Secondary | ICD-10-CM

## 2022-07-14 DIAGNOSIS — E559 Vitamin D deficiency, unspecified: Secondary | ICD-10-CM | POA: Diagnosis not present

## 2022-07-14 DIAGNOSIS — E669 Obesity, unspecified: Secondary | ICD-10-CM | POA: Diagnosis not present

## 2022-07-14 DIAGNOSIS — D508 Other iron deficiency anemias: Secondary | ICD-10-CM | POA: Diagnosis not present

## 2022-07-14 DIAGNOSIS — Z6828 Body mass index (BMI) 28.0-28.9, adult: Secondary | ICD-10-CM

## 2022-07-14 MED ORDER — WEGOVY 2.4 MG/0.75ML ~~LOC~~ SOAJ
2.4000 mg | SUBCUTANEOUS | 0 refills | Status: DC
  Filled 2022-07-14: qty 9, 84d supply, fill #0
  Filled 2022-08-03: qty 3, 28d supply, fill #0

## 2022-07-14 NOTE — Progress Notes (Signed)
Chief Complaint:   OBESITY Angela [name] is here to discuss her progress with her obesity treatment plan along with follow-up of her obesity related diagnoses. Angela [name] is on the Category 3 Plan and states she is following her eating plan approximately 70% of the time. Angela [name] states she is standing and walking for 30 minutes 2-3 times per week.  Today's visit was #: 6 Starting weight: 220 lbs Starting date: 10/05/2018 Today's weight: 189 lbs Today's date: 07/14/2022 Total lbs lost to date: 31 lbs Total lbs lost since last in-office visit: 3 lbs  Interim History: Patient is trying to stay consistent with meal plan over the last 5-6 weeks.  She is following what she is used to eating and trying to get all protein in for the day.  She tries to stay mindful of feelings of satiety thru the day.  Has a trip to [place] city for a wedding anniversary. Did not get the 3 months of the [facility] at the end of February.  Using mostly resistance bands and walking throughout the week.    Subjective:   1. Vitamin D deficiency Patient is on OTC Vitamin D.  Vitamin D level in January slightly over half of goal level.   2. Other iron deficiency anemia Patient is on fusion plus and now getting iron infusions x 3. Plans for labs in April per hematology/oncology.   Assessment/Plan:   1. Vitamin D deficiency Continue OTC Vitamin D.   2. Other iron deficiency anemia Follow up labs in one month.   3. Obesity with current BMI of 28.8 Refill/start spacing out Wegovy to every 10-14 days - Semaglutide-Weight Management (WEGOVY) 2.4 MG/0.75ML SOAJ; Inject 2.4 mg into the skin once a week.  Dispense: 9 mL; Refill: 0  Angela [name] is currently in the action stage of change. As such, her goal is to continue with weight loss efforts. She has agreed to the Category 3 Plan and keeping a food journal and adhering to recommended goals of 1450-1550 calories and 90+ protein daily.   Exercise goals:  As is.   Behavioral  modification strategies: increasing lean protein intake, meal planning and cooking strategies, keeping healthy foods in the home, and planning for success.  Angela [name] has agreed to follow-up with our clinic in 5-6 weeks. She was informed of the importance of frequent follow-up visits to maximize her success with intensive lifestyle modifications for her multiple health conditions.   Objective:   Blood pressure 125/74, pulse (!) 36, temperature 98.7 F (37.1 C), height 5\' 8"  (1.727 m), weight 189 lb (85.7 kg), last menstrual period 06/11/2022, SpO2 99 %. Body mass index is 28.74 kg/m.  General: Cooperative, alert, well developed, in no acute distress. HEENT: Conjunctivae and lids unremarkable. Cardiovascular: Regular rhythm.  Lungs: Normal work of breathing. Neurologic: No focal deficits.   Lab Results  Component Value Date   CREATININE 0.65 05/14/2022   BUN 15 05/14/2022   NA 140 05/14/2022   K 4.8 05/14/2022   CL 107 (H) 05/14/2022   CO2 22 05/14/2022   Lab Results  Component Value Date   ALT 23 05/14/2022   AST 21 05/14/2022   ALKPHOS 73 05/14/2022   BILITOT 0.3 05/14/2022   Lab Results  Component Value Date   HGBA1C 5.3 05/14/2022   HGBA1C 5.5 05/13/2021   HGBA1C 5.6 10/30/2020   HGBA1C 5.5 11/01/2019   HGBA1C 5.5 05/25/2019   Lab Results  Component Value Date   INSULIN 18.4 05/14/2022   INSULIN 18.9 05/13/2021  INSULIN 24.4 11/01/2019   INSULIN 24.5 05/25/2019   INSULIN 21.9 02/02/2019   Lab Results  Component Value Date   TSH 2.020 10/05/2018   Lab Results  Component Value Date   CHOL 144 10/30/2020   HDL 55.50 10/30/2020   LDLCALC 72 10/30/2020   TRIG 81.0 10/30/2020   CHOLHDL 3 10/30/2020   Lab Results  Component Value Date   VD25OH 36.6 05/14/2022   VD25OH 91.7 05/13/2021   VD25OH 29.02 (L) 10/30/2020   Lab Results  Component Value Date   WBC 5.7 06/16/2022   HGB 8.8 (L) 06/16/2022   HCT 28.0 (L) 06/16/2022   MCV 82.4 06/16/2022   PLT 255  06/16/2022   Lab Results  Component Value Date   IRON 46 06/16/2022   TIBC 442 06/16/2022   FERRITIN 11 06/16/2022   Attestation Statements:   Reviewed by clinician on day of visit: allergies, medications, problem list, medical history, surgical history, family history, social history, and previous encounter notes.  I, Davy Pique, RMA, am acting as transcriptionist for Coralie Common, MD.  I have reviewed the above documentation for accuracy and completeness, and I agree with the above. - Coralie Common, MD

## 2022-07-17 ENCOUNTER — Other Ambulatory Visit (HOSPITAL_COMMUNITY): Payer: Self-pay

## 2022-07-28 ENCOUNTER — Encounter: Payer: Self-pay | Admitting: Internal Medicine

## 2022-07-28 ENCOUNTER — Other Ambulatory Visit: Payer: Self-pay

## 2022-07-29 ENCOUNTER — Other Ambulatory Visit: Payer: Self-pay

## 2022-08-03 ENCOUNTER — Other Ambulatory Visit: Payer: Self-pay

## 2022-08-04 ENCOUNTER — Other Ambulatory Visit: Payer: Self-pay

## 2022-08-07 ENCOUNTER — Encounter: Payer: Self-pay | Admitting: Internal Medicine

## 2022-08-07 ENCOUNTER — Inpatient Hospital Stay: Payer: 59 | Attending: Internal Medicine

## 2022-08-07 ENCOUNTER — Inpatient Hospital Stay: Payer: 59

## 2022-08-07 ENCOUNTER — Inpatient Hospital Stay (HOSPITAL_BASED_OUTPATIENT_CLINIC_OR_DEPARTMENT_OTHER): Payer: 59 | Admitting: Internal Medicine

## 2022-08-07 VITALS — BP 122/74 | HR 75

## 2022-08-07 DIAGNOSIS — Z803 Family history of malignant neoplasm of breast: Secondary | ICD-10-CM | POA: Diagnosis not present

## 2022-08-07 DIAGNOSIS — K760 Fatty (change of) liver, not elsewhere classified: Secondary | ICD-10-CM | POA: Diagnosis not present

## 2022-08-07 DIAGNOSIS — D509 Iron deficiency anemia, unspecified: Secondary | ICD-10-CM | POA: Insufficient documentation

## 2022-08-07 DIAGNOSIS — D649 Anemia, unspecified: Secondary | ICD-10-CM

## 2022-08-07 DIAGNOSIS — N920 Excessive and frequent menstruation with regular cycle: Secondary | ICD-10-CM | POA: Diagnosis not present

## 2022-08-07 DIAGNOSIS — Z800 Family history of malignant neoplasm of digestive organs: Secondary | ICD-10-CM | POA: Diagnosis not present

## 2022-08-07 LAB — CBC WITH DIFFERENTIAL (CANCER CENTER ONLY)
Abs Immature Granulocytes: 0.02 10*3/uL (ref 0.00–0.07)
Basophils Absolute: 0 10*3/uL (ref 0.0–0.1)
Basophils Relative: 0 %
Eosinophils Absolute: 0.1 10*3/uL (ref 0.0–0.5)
Eosinophils Relative: 1 %
HCT: 32.1 % — ABNORMAL LOW (ref 36.0–46.0)
Hemoglobin: 9.9 g/dL — ABNORMAL LOW (ref 12.0–15.0)
Immature Granulocytes: 0 %
Lymphocytes Relative: 30 %
Lymphs Abs: 2.1 10*3/uL (ref 0.7–4.0)
MCH: 25 pg — ABNORMAL LOW (ref 26.0–34.0)
MCHC: 30.8 g/dL (ref 30.0–36.0)
MCV: 81.1 fL (ref 80.0–100.0)
Monocytes Absolute: 0.6 10*3/uL (ref 0.1–1.0)
Monocytes Relative: 8 %
Neutro Abs: 4.2 10*3/uL (ref 1.7–7.7)
Neutrophils Relative %: 61 %
Platelet Count: 216 10*3/uL (ref 150–400)
RBC: 3.96 MIL/uL (ref 3.87–5.11)
RDW: 19.1 % — ABNORMAL HIGH (ref 11.5–15.5)
WBC Count: 7 10*3/uL (ref 4.0–10.5)
nRBC: 0 % (ref 0.0–0.2)

## 2022-08-07 MED ORDER — SODIUM CHLORIDE 0.9 % IV SOLN
200.0000 mg | Freq: Once | INTRAVENOUS | Status: AC
  Administered 2022-08-07: 200 mg via INTRAVENOUS
  Filled 2022-08-07: qty 200

## 2022-08-07 MED ORDER — SODIUM CHLORIDE 0.9 % IV SOLN
Freq: Once | INTRAVENOUS | Status: AC
  Filled 2022-08-07: qty 250

## 2022-08-07 NOTE — Addendum Note (Signed)
Addended by: Darrold Span A on: 08/07/2022 03:35 PM   Modules accepted: Orders

## 2022-08-07 NOTE — Progress Notes (Signed)
Feels better. Last menstrual cycle was better. It was heavy but only lasted 6 days instead of 13 days. Lightheaded last week after doing yard work. No blood in stool.  Denies dyspnea.

## 2022-08-07 NOTE — Progress Notes (Signed)
[facility] CONSULT NOTE  Patient Care Team: Doreene Nest, NP as PCP - General (Internal Medicine) Conard Novak, MD as Attending Physician (Obstetrics and Gynecology) Langston Reusing, MD as Consulting Physician (Family Medicine)  CHIEF COMPLAINTS/PURPOSE OF CONSULTATION: ANEMIA   HEMATOLOGY HISTORY:   # Chronic iron deficient anemia JAN 2024-ferritin 6; EGD/colonoscopy/capsule 2023- NEG  # Heavy menstrual cycles  # Liver disease: fatty liver.  Hx of ERCP   Latest Reference Range & Units 05/14/22 10:14  Iron 27 - 159 ug/dL 161  UIBC 096 - 045 ug/dL 409  TIBC 811 - 914 ug/dL 782  Ferritin 15 - 956 ng/mL 7 (L)  Iron Saturation 15 - 55 % 29  (L): Data is abnormally low   Latest Reference Range & Units 05/14/22 10:14  Hemoglobin 11.1 - 15.9 g/dL 9.0 (L)  HCT 21.3 - 08.6 % 28.1 (L)  MCV 79 - 97 fL 87  (L): Data is abnormally low    HISTORY OF PRESENTING ILLNESS:  Angela [name] 47 y.o.  female pleasant patient with iron deficiency anemia likely secondary to menorrhagia is here for follow-up.  Patient s/p Venofer iron infusions.  Tolerated well.  Patient overall feels better. Last menstrual cycle was better. It was heavy but only lasted 6 days instead of 13 days. Lightheaded last week after doing yard work. No blood in stool. Denies dyspnea  Review of Systems  Constitutional:  Positive for malaise/fatigue. Negative for chills, diaphoresis, fever and weight loss.  HENT:  Negative for nosebleeds and sore throat.   Eyes:  Negative for double vision.  Respiratory:  Positive for shortness of breath. Negative for cough, hemoptysis, sputum production and wheezing.   Cardiovascular:  Negative for chest pain, palpitations, orthopnea and leg swelling.  Gastrointestinal:  Negative for abdominal pain, blood in stool, constipation, diarrhea, heartburn, melena, nausea and vomiting.  Genitourinary:  Negative for dysuria, frequency and urgency.   Musculoskeletal:  Negative for back pain and joint pain.  Skin: Negative.  Negative for itching and rash.  Neurological:  Positive for dizziness. Negative for tingling, focal weakness, weakness and headaches.  Endo/Heme/Allergies:  Does not bruise/bleed easily.  Psychiatric/Behavioral:  Negative for depression. The patient is not nervous/anxious and does not have insomnia.      MEDICAL HISTORY:  Past Medical History:  Diagnosis Date   Allergy Enviromental   Anemia 08/2015   Asthma    as a child   Back pain gerd   Constipation    Dysuria 11/25/2018   Glaucoma    Heart murmur childhood   IBS (irritable bowel syndrome)    Joint pain    Menorrhagia    Palpitations    Plantar fasciitis    Prediabetes    Swelling    feet, legs   Torn meniscus    Right   Vaginal burning 11/25/2018   Voice hoarseness 07/04/2018    SURGICAL HISTORY: Past Surgical History:  Procedure Laterality Date   CHOLECYSTECTOMY  2005   COLONOSCOPY WITH PROPOFOL N/A 10/27/2018   Procedure: COLONOSCOPY WITH PROPOFOL;  Surgeon: Toney Reil, MD;  Location: [facility] ENDOSCOPY;  Service: Gastroenterology;  Laterality: N/A;   COLONOSCOPY WITH PROPOFOL N/A 10/31/2021   Procedure: COLONOSCOPY WITH PROPOFOL;  Surgeon: Toney Reil, MD;  Location: [facility] ENDOSCOPY;  Service: Gastroenterology;  Laterality: N/A;   DILATATION & CURETTAGE/HYSTEROSCOPY WITH MYOSURE N/A 06/23/2018   Procedure: DILATATION & CURETTAGE/HYSTEROSCOPY WITH MYOSURE;  Surgeon: Conard Novak, MD;  Location: [facility] ORS;  Service: Gynecology;  Laterality: N/A;   DILATION AND CURETTAGE OF UTERUS     ERCP     3 times   ESOPHAGOGASTRODUODENOSCOPY (EGD) WITH PROPOFOL  10/27/2018   Procedure: ESOPHAGOGASTRODUODENOSCOPY (EGD) WITH PROPOFOL;  Surgeon: Toney Reil, MD;  Location: [facility] ENDOSCOPY;  Service: Gastroenterology;;   EYE SURGERY     GIVENS CAPSULE STUDY N/A 12/11/2019   Procedure: GIVENS CAPSULE STUDY;  Surgeon: Toney Reil,  MD;  Location: [facility] ENDOSCOPY;  Service: Gastroenterology;  Laterality: N/A;   MENISCUS REPAIR Right    x 2    SOCIAL HISTORY: Social History   Socioeconomic History   Marital status: Married    Spouse name: Emergency planning/management officer   Number of children: 0   Years of education: Not on file   Highest education level: Not on file  Occupational History   Occupation: Buyer, retail: [employer]  Tobacco Use   Smoking status: Never   Smokeless tobacco: Never  Vaping Use   Vaping Use: Never used  Substance and Sexual Activity   Alcohol use: Yes    Alcohol/week: 2.0 standard drinks of alcohol    Types: 2 Glasses of wine per week    Comment: occasional   Drug use: No   Sexual activity: Yes    Birth control/protection: None  Other Topics Concern   Not on file  Social History Narrative   Married.   No children.   Works in the [employer].   Enjoys watching movies, walking, reading.    Social Determinants of Health   Financial Resource Strain: Not on file  Food Insecurity: No Food Insecurity (06/16/2022)   Hunger Vital Sign    Worried About Running Out of Food in the Last Year: Never true    Ran Out of Food in the Last Year: Never true  Transportation Needs: No Transportation Needs (06/16/2022)   PRAPARE - Administrator, Civil Service (Medical): No    Lack of Transportation (Non-Medical): No  Physical Activity: Unknown (04/29/2018)   Exercise Vital Sign    Days of Exercise per Week: 0 days    Minutes of Exercise per Session: Not on file  Stress: Not on file  Social Connections: Not on file  Intimate Partner Violence: Not At Risk (06/16/2022)   Humiliation, Afraid, Rape, and Kick questionnaire    Fear of Current or Ex-Partner: No    Emotionally Abused: No    Physically Abused: No    Sexually Abused: No    FAMILY HISTORY: Family History  Problem Relation Age of Onset   Colon cancer Mother 82   Diabetes Mother    Hypertension Mother     Arthritis Mother    Transient ischemic attack Mother    Liver disease Mother    Obesity Mother    Diabetes Father    Heart disease Father    Breast cancer Maternal Aunt        mat great aunt   Esophageal cancer Maternal Grandmother    Cancer Maternal Grandmother    Diabetes Maternal Grandmother    Alcohol abuse Maternal Grandfather     ALLERGIES:  is allergic to quinoa-kale-hemp [alimentum].  MEDICATIONS:  Current Outpatient Medications  Medication Sig Dispense Refill   atropine 1 % ophthalmic solution Place 1 drop into the right eye daily. 5 mL 11   brimonidine-timolol (COMBIGAN) 0.2-0.5 % ophthalmic solution Place 1 drop into the right eye every 12 (twelve) hours. 10 mL 11   cetirizine (ZYRTEC) 10 MG  tablet Take 10 mg by mouth daily as needed for allergies.     Cholecalciferol (VITAMIN D3) 50 MCG (2000 UT) capsule Take 1 capsule (2,000 Units total) by mouth daily.     ibuprofen (ADVIL,MOTRIN) 600 MG tablet Take 1 tablet (600 mg total) by mouth every 6 (six) hours as needed for mild pain, moderate pain or cramping. 30 tablet 0   Iron-FA-B Cmp-C-Biot-Probiotic (FUSION PLUS) CAPS Take 1 capsule by mouth daily. 90 capsule 0   linaclotide (LINZESS) 145 MCG CAPS capsule Take 1 capsule (145 mcg total) by mouth daily before breakfast. 30 capsule 1   psyllium (METAMUCIL) 58.6 % packet Take 1 packet by mouth daily.     Semaglutide-Weight Management (WEGOVY) 2.4 MG/0.75ML SOAJ Inject 2.4 mg into the skin once a week. 9 mL 0   atovaquone-proguanil (MALARONE) 250-100 MG TABS tablet Take 1 tablet by mouth daily beginning 2 days prior to trip. Continue once daily during trip and continue once daily for 7 days after trip. (Patient not taking: Reported on 08/07/2022) 16 tablet 0   No current facility-administered medications for this visit.     Marland Kitchen  PHYSICAL EXAMINATION:   Vitals:   08/07/22 1426  BP: 102/62  Pulse: 89  Resp: 16  Temp: 98.8 F (37.1 C)  SpO2: 100%   Filed Weights    08/07/22 1426  Weight: 195 lb 6.4 oz (88.6 kg)    Physical Exam Vitals and nursing note reviewed.  HENT:     Head: Normocephalic and atraumatic.     Mouth/Throat:     Pharynx: Oropharynx is clear.  Eyes:     Extraocular Movements: Extraocular movements intact.     Pupils: Pupils are equal, round, and reactive to light.  Cardiovascular:     Rate and Rhythm: Normal rate and regular rhythm.  Pulmonary:     Comments: Decreased breath sounds bilaterally.  Abdominal:     Palpations: Abdomen is soft.  Musculoskeletal:        General: Normal range of motion.     Cervical back: Normal range of motion.  Skin:    General: Skin is warm.  Neurological:     General: No focal deficit present.     Mental Status: She is alert and oriented to person, place, and time.  Psychiatric:        Behavior: Behavior normal.        Judgment: Judgment normal.      LABORATORY DATA:  I have reviewed the data as listed Lab Results  Component Value Date   WBC 7.0 08/07/2022   HGB 9.9 (L) 08/07/2022   HCT 32.1 (L) 08/07/2022   MCV 81.1 08/07/2022   PLT 216 08/07/2022   Recent Labs    05/14/22 1014  NA 140  K 4.8  CL 107*  CO2 22  GLUCOSE 85  BUN 15  CREATININE 0.65  CALCIUM 8.9  PROT 6.6  ALBUMIN 4.2  AST 21  ALT 23  ALKPHOS 73  BILITOT 0.3     No results found.  ASSESSMENT & PLAN:   Symptomatic anemia # Anemia- Hb-symptomatic.  Likely due to iron deficiency - from etiology menorrhagia.  Continue oral iron- Biglycinate; tolerating fairly well.  # s/p IV iron- venofer-proceed with Venofer today.  Patient will again need maintenance Venofer given ongoing menorrhagia  #Etiology of iron deficiency: Likely secondary to menorrhagia [Dr.jackson].  EGD colonoscopy capsule- ? 2023- NEG.  # DISPOSITION: # venofer  today # follow up in 3 months MD: labs-  cbc; iron studies; ferritin;  possible venofer-Dr.B   All questions were answered. The patient knows to call the clinic with any  problems, questions or concerns.   Earna Coder, MD 08/07/2022 3:11 PM

## 2022-08-07 NOTE — Patient Instructions (Signed)

## 2022-08-07 NOTE — Assessment & Plan Note (Addendum)
#   Anemia- Hb-symptomatic.  Likely due to iron deficiency - from etiology menorrhagia.  Continue oral iron- Biglycinate; tolerating fairly well.  # s/p IV iron-hemoglobin is improved.  venofer-proceed with Venofer today.  Patient will again need maintenance Venofer given ongoing menorrhagia  #Etiology of iron deficiency: Likely secondary to menorrhagia [Dr.jackson].  EGD colonoscopy capsule- ? 2023- NEG.  # DISPOSITION: # venofer  today # follow up in 3 months MD: labs- cbc; iron studies; ferritin;  possible venofer-Dr.B

## 2022-08-17 ENCOUNTER — Other Ambulatory Visit: Payer: 59

## 2022-08-17 ENCOUNTER — Ambulatory Visit: Payer: 59

## 2022-08-17 ENCOUNTER — Ambulatory Visit: Payer: 59 | Admitting: Internal Medicine

## 2022-08-21 ENCOUNTER — Other Ambulatory Visit: Payer: Self-pay

## 2022-08-24 ENCOUNTER — Other Ambulatory Visit: Payer: Self-pay

## 2022-08-24 ENCOUNTER — Ambulatory Visit (INDEPENDENT_AMBULATORY_CARE_PROVIDER_SITE_OTHER): Payer: 59 | Admitting: Gastroenterology

## 2022-08-24 ENCOUNTER — Encounter: Payer: Self-pay | Admitting: Gastroenterology

## 2022-08-24 DIAGNOSIS — K5909 Other constipation: Secondary | ICD-10-CM

## 2022-08-24 MED ORDER — LINACLOTIDE 145 MCG PO CAPS
145.0000 ug | ORAL_CAPSULE | Freq: Every day | ORAL | 3 refills | Status: DC
Start: 2022-08-24 — End: 2023-08-31
  Filled 2022-08-24: qty 90, 90d supply, fill #0
  Filled 2022-12-01: qty 90, 90d supply, fill #1
  Filled 2023-02-24: qty 90, 90d supply, fill #2
  Filled 2023-05-25: qty 90, 90d supply, fill #3
  Filled 2023-05-26: qty 30, 30d supply, fill #3
  Filled 2023-06-28: qty 30, 30d supply, fill #4
  Filled 2023-07-26: qty 30, 30d supply, fill #5

## 2022-08-24 NOTE — Progress Notes (Signed)
Angela Repress, MD [address]  [address]  [place], [place] [address]  Main: [phone]  Fax: [phone]    Gastroenterology Consultation  Referring Provider:     Doreene Nest, NP Primary Care Physician:  Doreene Nest, NP Primary Gastroenterologist:  Dr. Arlyss [name] Reason for Consultation:   IDA, chronic constipation   HPI:   Brayla Rodriguez-Guzman is a 47 y.o. female referred by Dr. Chestine Spore, Keane Scrape, NP  for consultation & management of chronic constipation.  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  She reports that her constipation is well controlled with Linzess and fiber Gummies.  She is taking Linzess 145 mcg about 3-4 days a week.  She does have chronic iron deficiency anemia secondary to menorrhagia.  She is closely followed by OB/GYN as well as heme-onc for parenteral iron therapy as needed. She continues to take fusion plus every other day.  She denies any other GI symptoms.   NSAIDs: None  Antiplts/Anticoagulants/Anti thrombotics: None  GI Procedures:   Colonoscopy 10/31/2021 Normal terminal ileum Normal colon Normal retroflexion in the rectum  EGD and colonoscopy 10/27/2018 - Normal duodenal bulb and second portion of the duodenum. - A few gastric polyps. Resected and retrieved. - Normal gastroesophageal junction and esophagus.  - One 5 mm polyp in the cecum, removed with mucosal resection. Resected and retrieved. Clip was placed. - One 8 mm polyp in the ascending colon, removed with a hot snare. Resected and retrieved. Clip was placed. - One 5 mm polyp in the descending colon, removed with a cold snare. Resected and retrieved. - The distal rectum and anal verge are normal on retroflexion view. - The examination was otherwise normal. - Examined portion of the terminal ileum was  normal - Mucosal resection was performed. Resection and retrieval were complete.  DIAGNOSIS:  A.  STOMACH POLYP X2, FUNDUS; HOT SNARE:  - FUNDIC GLAND POLYP, 2 FRAGMENTS.  - NEGATIVE FOR DYSPLASIA AND MALIGNANCY.   B.  COLON POLYP, CECUM; HOT SNARE:  - SESSILE SERRATED POLYP.  - NEGATIVE FOR DYSPLASIA AND MALIGNANCY.   C.  COLON POLYP, ASCENDING; HOT SNARE:  - TUBULAR ADENOMA.  - NEGATIVE FOR HIGH-GRADE DYSPLASIA AND MALIGNANCY.   D.  COLON POLYP, DESCENDING; COLD SNARE:  - TUBULAR ADENOMA.  - NEGATIVE FOR HIGH-GRADE DYSPLASIA AND MALIGNANCY.  Past Medical History:  Diagnosis Date   Allergy Enviromental   Anemia 08/2015   Asthma    as a child   Back pain gerd   Constipation    Dysuria 11/25/2018   Glaucoma    Heart murmur childhood   IBS (irritable bowel syndrome)    Joint pain    Menorrhagia    Palpitations    Plantar fasciitis    Prediabetes    Swelling    feet, legs   Torn meniscus    Right   Vaginal burning 11/25/2018   Voice hoarseness 07/04/2018    Past Surgical History:  Procedure Laterality Date   CHOLECYSTECTOMY  2005   COLONOSCOPY WITH PROPOFOL N/A 10/27/2018   Procedure: COLONOSCOPY WITH PROPOFOL;  Surgeon: Toney Reil, MD;  Location: [facility] ENDOSCOPY;  Service: Gastroenterology;  Laterality: N/A;   COLONOSCOPY WITH PROPOFOL N/A 10/31/2021   Procedure: COLONOSCOPY WITH PROPOFOL;  Surgeon: Toney Reil, MD;  Location: [facility] ENDOSCOPY;  Service: Gastroenterology;  Laterality: N/A;   DILATATION & CURETTAGE/HYSTEROSCOPY WITH MYOSURE N/A 06/23/2018   Procedure: DILATATION & CURETTAGE/HYSTEROSCOPY WITH MYOSURE;  Surgeon: Conard Novak, MD;  Location: [facility] ORS;  Service: Gynecology;  Laterality: N/A;   DILATION AND CURETTAGE OF UTERUS     ERCP     3 times   ESOPHAGOGASTRODUODENOSCOPY (EGD) WITH PROPOFOL  10/27/2018   Procedure: ESOPHAGOGASTRODUODENOSCOPY (EGD) WITH PROPOFOL;  Surgeon: Toney Reil, MD;  Location: [facility] ENDOSCOPY;  Service:  Gastroenterology;;   EYE SURGERY     GIVENS CAPSULE STUDY N/A 12/11/2019   Procedure: GIVENS CAPSULE STUDY;  Surgeon: Toney Reil, MD;  Location: [facility] ENDOSCOPY;  Service: Gastroenterology;  Laterality: N/A;   MENISCUS REPAIR Right    x 2    Current Outpatient Medications:    atropine 1 % ophthalmic solution, Place 1 drop into the right eye daily., Disp: 5 mL, Rfl: 11   brimonidine-timolol (COMBIGAN) 0.2-0.5 % ophthalmic solution, Place 1 drop into the right eye every 12 (twelve) hours., Disp: 10 mL, Rfl: 11   cetirizine (ZYRTEC) 10 MG tablet, Take 10 mg by mouth daily as needed for allergies., Disp: , Rfl:    Cholecalciferol (VITAMIN D3) 50 MCG (2000 UT) capsule, Take 1 capsule (2,000 Units total) by mouth daily., Disp: , Rfl:    Fiber Adult Gummies 2 g CHEW, , Disp: , Rfl:    fluticasone (FLONASE) 50 MCG/ACT nasal spray, Place 2 sprays into both nostrils daily., Disp: , Rfl:    ibuprofen (ADVIL,MOTRIN) 600 MG tablet, Take 1 tablet (600 mg total) by mouth every 6 (six) hours as needed for mild pain, moderate pain or cramping., Disp: 30 tablet, Rfl: 0   Iron-FA-B Cmp-C-Biot-Probiotic (FUSION PLUS) CAPS, Take 1 capsule by mouth daily., Disp: 90 capsule, Rfl: 0   Semaglutide-Weight Management (WEGOVY) 2.4 MG/0.75ML SOAJ, Inject 2.4 mg into the skin once a week., Disp: 9 mL, Rfl: 0   atovaquone-proguanil (MALARONE) 250-100 MG TABS tablet, Take 1 tablet by mouth daily beginning 2 days prior to trip. Continue once daily during trip and continue once daily for 7 days after trip. (Patient not taking: Reported on 08/07/2022), Disp: 16 tablet, Rfl: 0   linaclotide (LINZESS) 145 MCG CAPS capsule, Take 1 capsule (145 mcg total) by mouth daily before breakfast., Disp: 90 capsule, Rfl: 3    Family History  Problem Relation Age of Onset   Colon cancer Mother 93   Diabetes Mother    Hypertension Mother    Arthritis Mother    Transient ischemic attack Mother    Liver disease Mother    Obesity  Mother    Diabetes Father    Heart disease Father    Breast cancer Maternal Aunt        mat great aunt   Esophageal cancer Maternal Grandmother    Cancer Maternal Grandmother    Diabetes Maternal Grandmother    Alcohol abuse Maternal Grandfather      Social History   Tobacco Use   Smoking status: Never   Smokeless tobacco: Never  Vaping Use   Vaping Use: Never used  Substance Use Topics   Alcohol use: Yes    Alcohol/week: 2.0 standard drinks of alcohol    Types: 2 Glasses of wine per week    Comment: occasional   Drug use: No    Allergies as of 08/24/2022 - Review Complete 08/24/2022  Allergen Reaction Noted   Quinoa-kale-hemp [alimentum]  10/05/2018    Review of Systems:  All systems reviewed and negative except where noted in HPI.   Physical Exam:  BP 118/78 (BP Location: Right Arm, Patient Position: Sitting, Cuff Size: Normal)   Pulse 76   Temp 98 F (36.7 C) (Oral)   Ht 5\' 8"  (1.727 m)   Wt 197 lb 8 oz (89.6 kg)   BMI 30.03 kg/m  No LMP recorded.  General:   Alert,  Well-developed, well-nourished, pleasant and cooperative in NAD Head:  Normocephalic and atraumatic. Eyes:  Sclera clear, no icterus.   Conjunctiva pink. Ears:  Normal auditory acuity. Nose:  No deformity, discharge, or lesions. Mouth:  No deformity or lesions,oropharynx pink & moist. Neck:  Supple; no masses or thyromegaly. Lungs:  Respirations even and unlabored.  Clear throughout to auscultation.   No wheezes, crackles, or rhonchi. No acute distress. Heart:  Regular rate and rhythm; no murmurs, clicks, rubs, or gallops. Abdomen:  Normal bowel sounds. Soft, non-tender and non-distended without masses, hepatosplenomegaly or hernias noted.  No guarding or rebound tenderness.   Rectal: Not performed Msk:  Symmetrical without gross deformities. Good, equal movement & strength bilaterally. Pulses:  Normal pulses noted. Extremities:  No clubbing or edema.  No cyanosis. Neurologic:  Alert and  oriented x3;  grossly normal neurologically. Skin:  Intact without significant lesions or rashes. No jaundice. Psych:  Alert and cooperative. Normal mood and affect.  Imaging Studies: Reviewed  Assessment and Plan:   Amarys Rodriguez-Guzman is a 47 y.o. female with obesity, prediabetes on Metformin seen in consultation for iron deficiency anemia and chronic constipation.  Chronic constipation TSH is normal Continue high-fiber diet Continue Linzess 145 MCG daily, prescription provided  Iron deficiency anemia: secondary to menstrual blood loss EGD and colonoscopy are unremarkable, normal terminal ileum Video capsule endoscopy was unremarkable Continue fusion plus and follow-up with hematology for IV iron as needed Continue follow-up with OB/GYN regarding management of menorrhagia which is the source of iron deficiency anemia   Follow up as needed   Angela Repress, MD

## 2022-08-25 ENCOUNTER — Encounter (INDEPENDENT_AMBULATORY_CARE_PROVIDER_SITE_OTHER): Payer: Self-pay | Admitting: Family Medicine

## 2022-08-25 ENCOUNTER — Ambulatory Visit (INDEPENDENT_AMBULATORY_CARE_PROVIDER_SITE_OTHER): Payer: 59 | Admitting: Family Medicine

## 2022-08-25 VITALS — BP 109/68 | HR 96 | Temp 98.6°F | Ht 68.0 in | Wt 191.0 lb

## 2022-08-25 DIAGNOSIS — D508 Other iron deficiency anemias: Secondary | ICD-10-CM | POA: Diagnosis not present

## 2022-08-25 DIAGNOSIS — Z6829 Body mass index (BMI) 29.0-29.9, adult: Secondary | ICD-10-CM

## 2022-08-25 DIAGNOSIS — E669 Obesity, unspecified: Secondary | ICD-10-CM | POA: Diagnosis not present

## 2022-08-25 DIAGNOSIS — E559 Vitamin D deficiency, unspecified: Secondary | ICD-10-CM | POA: Diagnosis not present

## 2022-08-25 NOTE — Progress Notes (Signed)
Chief Complaint:   OBESITY Angela [name] is here to discuss her progress with her obesity treatment plan along with follow-up of her obesity related diagnoses. Angela [name] is on the Category 3 Plan and states she is following her eating plan approximately 70% of the time. Angela [name] states she is walking, yard work, resistance for 15 minutes 2-7 times per week.  Today's visit was #: 50 Starting weight: 220 LBS Starting date: 10/05/2018 Today's weight: 191 LBS Today's date: 08/25/2022 Total lbs lost to date: 29 LBS Total lbs lost since last in-office visit: +2 LBS  Interim History: Patient had a trip to [place] for a week and returned about a week ago.  She ate whatever she had available when vacationing.  Now that she is back she is starting to train to walk up to 18 miles in a day by next spring and has started walking.  She is doing Scientist, research (physical sciences) every other week.    Subjective:   1. Other iron deficiency anemia H&H decreased at last level.  Menstrual cycle decreasing in time, now 10 days versus 13 days.  2. Vitamin D deficiency Patient is on OTC vitamin D.  Patient is positive for fatigue.  Assessment/Plan:   1. Other iron deficiency anemia Follow-up with hematology/oncology for further management.  2. Vitamin D deficiency Continue OTC vitamin D.  3. BMI 29.0-29.9,adult  4. Obesity with current BMI of 29.2 Refill Wegovy 2.4 mg subcu weekly #9 mL, 0 refills.  Angela [name] is currently in the action stage of change. As such, her goal is to continue with weight loss efforts. She has agreed to the Category 3 Plan.   Exercise goals:  As is.  Behavioral modification strategies: increasing lean protein intake, meal planning and cooking strategies, keeping healthy foods in the home, and planning for success.  Angela [name] has agreed to follow-up with our clinic in 5-6 weeks. She was informed of the importance of frequent follow-up visits to maximize her success with intensive lifestyle modifications for her  multiple health conditions.   Objective:   Blood pressure 109/68, pulse 96, temperature 98.6 F (37 C), height 5\' 8"  (1.727 m), weight 191 lb (86.6 kg), last menstrual period 08/11/2022, SpO2 98 %. Body mass index is 29.04 kg/m.  General: Cooperative, alert, well developed, in no acute distress. HEENT: Conjunctivae and lids unremarkable. Cardiovascular: Regular rhythm.  Lungs: Normal work of breathing. Neurologic: No focal deficits.   Lab Results  Component Value Date   CREATININE 0.65 05/14/2022   BUN 15 05/14/2022   NA 140 05/14/2022   K 4.8 05/14/2022   CL 107 (H) 05/14/2022   CO2 22 05/14/2022   Lab Results  Component Value Date   ALT 23 05/14/2022   AST 21 05/14/2022   ALKPHOS 73 05/14/2022   BILITOT 0.3 05/14/2022   Lab Results  Component Value Date   HGBA1C 5.3 05/14/2022   HGBA1C 5.5 05/13/2021   HGBA1C 5.6 10/30/2020   HGBA1C 5.5 11/01/2019   HGBA1C 5.5 05/25/2019   Lab Results  Component Value Date   INSULIN 18.4 05/14/2022   INSULIN 18.9 05/13/2021   INSULIN 24.4 11/01/2019   INSULIN 24.5 05/25/2019   INSULIN 21.9 02/02/2019   Lab Results  Component Value Date   TSH 2.020 10/05/2018   Lab Results  Component Value Date   CHOL 144 10/30/2020   HDL 55.50 10/30/2020   LDLCALC 72 10/30/2020   TRIG 81.0 10/30/2020   CHOLHDL 3 10/30/2020   Lab Results  Component Value Date  VD25OH 36.6 05/14/2022   VD25OH 91.7 05/13/2021   VD25OH 29.02 (L) 10/30/2020   Lab Results  Component Value Date   WBC 7.0 08/07/2022   HGB 9.9 (L) 08/07/2022   HCT 32.1 (L) 08/07/2022   MCV 81.1 08/07/2022   PLT 216 08/07/2022   Lab Results  Component Value Date   IRON 46 06/16/2022   TIBC 442 06/16/2022   FERRITIN 11 06/16/2022   Attestation Statements:   Reviewed by clinician on day of visit: allergies, medications, problem list, medical history, surgical history, family history, social history, and previous encounter notes.  I, Malcolm Metro, RMA, am  acting as transcriptionist for Reuben Likes, MD.  I have reviewed the above documentation for accuracy and completeness, and I agree with the above. - Reuben Likes, MD

## 2022-08-29 MED ORDER — WEGOVY 2.4 MG/0.75ML ~~LOC~~ SOAJ
2.4000 mg | SUBCUTANEOUS | 0 refills | Status: DC
Start: 2022-08-29 — End: 2022-09-21
  Filled 2022-08-29: qty 9, 84d supply, fill #0
  Filled 2022-08-30: qty 3, 28d supply, fill #0
  Filled 2022-09-07: qty 9, 84d supply, fill #0

## 2022-08-30 ENCOUNTER — Other Ambulatory Visit: Payer: Self-pay

## 2022-08-31 ENCOUNTER — Other Ambulatory Visit: Payer: Self-pay

## 2022-09-04 ENCOUNTER — Encounter (INDEPENDENT_AMBULATORY_CARE_PROVIDER_SITE_OTHER): Payer: Self-pay | Admitting: Family Medicine

## 2022-09-07 ENCOUNTER — Other Ambulatory Visit: Payer: Self-pay

## 2022-09-07 ENCOUNTER — Encounter: Payer: Self-pay | Admitting: Internal Medicine

## 2022-09-11 ENCOUNTER — Encounter (INDEPENDENT_AMBULATORY_CARE_PROVIDER_SITE_OTHER): Payer: Self-pay | Admitting: Family Medicine

## 2022-09-21 ENCOUNTER — Encounter (INDEPENDENT_AMBULATORY_CARE_PROVIDER_SITE_OTHER): Payer: Self-pay | Admitting: Family Medicine

## 2022-09-21 ENCOUNTER — Ambulatory Visit (INDEPENDENT_AMBULATORY_CARE_PROVIDER_SITE_OTHER): Payer: 59 | Admitting: Family Medicine

## 2022-09-21 ENCOUNTER — Other Ambulatory Visit: Payer: Self-pay

## 2022-09-21 VITALS — BP 113/62 | HR 79 | Temp 98.5°F | Ht 68.0 in | Wt 194.0 lb

## 2022-09-21 DIAGNOSIS — Z6829 Body mass index (BMI) 29.0-29.9, adult: Secondary | ICD-10-CM

## 2022-09-21 DIAGNOSIS — R7303 Prediabetes: Secondary | ICD-10-CM | POA: Diagnosis not present

## 2022-09-21 DIAGNOSIS — E669 Obesity, unspecified: Secondary | ICD-10-CM | POA: Diagnosis not present

## 2022-09-21 DIAGNOSIS — E559 Vitamin D deficiency, unspecified: Secondary | ICD-10-CM

## 2022-09-21 DIAGNOSIS — E66811 Obesity, class 1: Secondary | ICD-10-CM

## 2022-09-21 MED ORDER — METFORMIN HCL 500 MG PO TABS
500.0000 mg | ORAL_TABLET | Freq: Two times a day (BID) | ORAL | 0 refills | Status: DC
Start: 2022-09-21 — End: 2022-12-15
  Filled 2022-09-21: qty 180, 90d supply, fill #0

## 2022-09-21 NOTE — Progress Notes (Signed)
Chief Complaint:   OBESITY Angela [name] is here to discuss her progress with her obesity treatment plan along with follow-up of her obesity related diagnoses. Abbygael is on the Category 3 Plan and states she is following her eating plan approximately 60% of the time. [name] states she is doing walking and strength for 20-30 minutes 2-3 times per week.  Today's visit was #: 51 Starting weight: 220 lbs Starting date: 10/05/2018 Today's weight: 194 lbs Today's date: 09/21/2022 Total lbs lost to date: 26 Total lbs lost since last in-office visit: 0  Interim History: Since last appointment patient has been working more and worked thru [employer] Day weekend.  She is working overtime due to Youth worker.  Feeling better after iron infusion in April.  She has been eating out more due to increase in work demands. She is only working overtime 5 hours next week and then doesn't have anything scheduled overtime wise until her mission trip.   Subjective:   1. Prediabetes Patient was previously on Wegovy but the cost is prohibitive.  No side effects were noted on metformin previously.  2. Vitamin D deficiency Patient is on a OTC vitamin D.  She denies nausea, vomiting, or muscle weakness but notes fatigue.  Assessment/Plan:   1. Prediabetes Patient agreed to restart metformin at 500 mg by mouth twice daily, with a 90-day supply.  - metFORMIN (GLUCOPHAGE) 500 MG tablet; Take 1 tablet (500 mg total) by mouth 2 (two) times daily with a meal.  Dispense: 180 tablet; Refill: 0  2. Vitamin D deficiency Patient will continue OTC vitamin D with no change in dose.  We will repeat fasting labs at her next appointment.  3. BMI 29.0-29.9,adult  4. Obesity with starting BMI of 33.4 [name] is currently in the action stage of change. As such, her goal is to continue with weight loss efforts. She has agreed to the Category 3 Plan.   Patient agreed to start [facility].  Discussed change to Qsymia at her next  appointment; we will revisit at her next appointment.  Exercise goals: As is.   Behavioral modification strategies: increasing lean protein intake, meal planning and cooking strategies, keeping healthy foods in the home, and planning for success.  Jenica has agreed to follow-up with our clinic in 6 weeks. She was informed of the importance of frequent follow-up visits to maximize her success with intensive lifestyle modifications for her multiple health conditions.   Objective:   Blood pressure 113/62, pulse 79, temperature 98.5 F (36.9 C), height 5\' 8"  (1.727 m), weight 194 lb (88 kg), last menstrual period 08/11/2022, SpO2 99 %. Body mass index is 29.5 kg/m.  General: Cooperative, alert, well developed, in no acute distress. HEENT: Conjunctivae and lids unremarkable. Cardiovascular: Regular rhythm.  Lungs: Normal work of breathing. Neurologic: No focal deficits.   Lab Results  Component Value Date   CREATININE 0.65 05/14/2022   BUN 15 05/14/2022   NA 140 05/14/2022   K 4.8 05/14/2022   CL 107 (H) 05/14/2022   CO2 22 05/14/2022   Lab Results  Component Value Date   ALT 23 05/14/2022   AST 21 05/14/2022   ALKPHOS 73 05/14/2022   BILITOT 0.3 05/14/2022   Lab Results  Component Value Date   HGBA1C 5.3 05/14/2022   HGBA1C 5.5 05/13/2021   HGBA1C 5.6 10/30/2020   HGBA1C 5.5 11/01/2019   HGBA1C 5.5 05/25/2019   Lab Results  Component Value Date   INSULIN 18.4 05/14/2022   INSULIN  18.9 05/13/2021   INSULIN 24.4 11/01/2019   INSULIN 24.5 05/25/2019   INSULIN 21.9 02/02/2019   Lab Results  Component Value Date   TSH 2.020 10/05/2018   Lab Results  Component Value Date   CHOL 144 10/30/2020   HDL 55.50 10/30/2020   LDLCALC 72 10/30/2020   TRIG 81.0 10/30/2020   CHOLHDL 3 10/30/2020   Lab Results  Component Value Date   VD25OH 36.6 05/14/2022   VD25OH 91.7 05/13/2021   VD25OH 29.02 (L) 10/30/2020   Lab Results  Component Value Date   WBC 7.0 08/07/2022    HGB 9.9 (L) 08/07/2022   HCT 32.1 (L) 08/07/2022   MCV 81.1 08/07/2022   PLT 216 08/07/2022   Lab Results  Component Value Date   IRON 46 06/16/2022   TIBC 442 06/16/2022   FERRITIN 11 06/16/2022   Attestation Statements:   Reviewed by clinician on day of visit: allergies, medications, problem list, medical history, surgical history, family history, social history, and previous encounter notes.   I, Burt Knack, am acting as transcriptionist for Reuben Likes, MD.  I have reviewed the above documentation for accuracy and completeness, and I agree with the above. - Reuben Likes, MD

## 2022-09-22 ENCOUNTER — Ambulatory Visit (INDEPENDENT_AMBULATORY_CARE_PROVIDER_SITE_OTHER): Payer: 59 | Admitting: Family Medicine

## 2022-09-23 ENCOUNTER — Other Ambulatory Visit: Payer: Self-pay

## 2022-10-30 ENCOUNTER — Encounter: Payer: Self-pay | Admitting: Internal Medicine

## 2022-11-01 ENCOUNTER — Encounter (INDEPENDENT_AMBULATORY_CARE_PROVIDER_SITE_OTHER): Payer: Self-pay | Admitting: Family Medicine

## 2022-11-02 ENCOUNTER — Other Ambulatory Visit: Payer: Self-pay

## 2022-11-02 ENCOUNTER — Ambulatory Visit (INDEPENDENT_AMBULATORY_CARE_PROVIDER_SITE_OTHER): Payer: 59 | Admitting: Family Medicine

## 2022-11-05 MED FILL — Iron Sucrose Inj 20 MG/ML (Fe Equiv): INTRAVENOUS | Qty: 10 | Status: AC

## 2022-11-06 ENCOUNTER — Inpatient Hospital Stay: Payer: 59

## 2022-11-06 ENCOUNTER — Inpatient Hospital Stay: Payer: 59 | Admitting: Internal Medicine

## 2022-11-11 ENCOUNTER — Encounter (INDEPENDENT_AMBULATORY_CARE_PROVIDER_SITE_OTHER): Payer: Self-pay | Admitting: Family Medicine

## 2022-11-11 ENCOUNTER — Ambulatory Visit (INDEPENDENT_AMBULATORY_CARE_PROVIDER_SITE_OTHER): Payer: 59 | Admitting: Family Medicine

## 2022-11-11 ENCOUNTER — Other Ambulatory Visit: Payer: Self-pay | Admitting: Primary Care

## 2022-11-11 VITALS — BP 125/77 | HR 102 | Temp 98.4°F | Ht 68.0 in | Wt 196.0 lb

## 2022-11-11 DIAGNOSIS — Z6829 Body mass index (BMI) 29.0-29.9, adult: Secondary | ICD-10-CM

## 2022-11-11 DIAGNOSIS — N63 Unspecified lump in unspecified breast: Secondary | ICD-10-CM

## 2022-11-11 DIAGNOSIS — D508 Other iron deficiency anemias: Secondary | ICD-10-CM

## 2022-11-11 DIAGNOSIS — E669 Obesity, unspecified: Secondary | ICD-10-CM | POA: Diagnosis not present

## 2022-11-11 DIAGNOSIS — Z1231 Encounter for screening mammogram for malignant neoplasm of breast: Secondary | ICD-10-CM

## 2022-11-11 DIAGNOSIS — R928 Other abnormal and inconclusive findings on diagnostic imaging of breast: Secondary | ICD-10-CM

## 2022-11-11 NOTE — Progress Notes (Signed)
Chief Complaint:   OBESITY Angela [name] is here to discuss her progress with her obesity treatment plan along with follow-up of her obesity related diagnoses. Angela [name] is on the Category 3 Plan and states she is following her eating plan approximately 25% of the time. Angela [name] states she is doing strengthening for 30 minutes 3 times per week, and walking 10,000 steps 7 times per week.  Today's visit was #: 52 Starting weight: 220 lbs Starting date: 10/05/2018 Today's weight: 196 lbs Today's date: 11/11/2022 Total lbs lost to date: 24 Total lbs lost since last in-office visit: 0  Interim History: Patient voices that she is feeling achey in many different joints.  She celebrated her birthday and took 2 days off work.  She was at her mission trip for a week prior to her birthday.  Her mission trip was busy and she ended up getting sick with fever and congestion.  Foodwise she realizes that she hasn't been as on plan due to the mission trip and due to her birthday.  She is noticing a bit more swelling as well in her lower extremities as well.  Subjective:   1. Other iron deficiency anemia Patient is on iron fusion plus supplementation, and she notes fatigue.  Assessment/Plan:   1. Other iron deficiency anemia Patient will continue fusion plus with no change in treatment; she is to follow-up with Hematology in the next few weeks.  2. BMI 29.0-29.9,adult  3. Obesity with starting BMI of 33.4 Angela [name] is currently in the action stage of change. As such, her goal is to continue with weight loss efforts. She has agreed to the Category 3 Plan.   Exercise goals: As is.   Behavioral modification strategies: increasing lean protein intake, meal planning and cooking strategies, keeping healthy foods in the home, and planning for success.  Angela [name] has agreed to follow-up with our clinic in 5 to 6 weeks. She was informed of the importance of frequent follow-up visits to maximize her success with intensive  lifestyle modifications for her multiple health conditions.   Objective:   Blood pressure 125/77, pulse (!) 102, temperature 98.4 F (36.9 C), height 5\' 8"  (1.727 m), weight 196 lb (88.9 kg), SpO2 98%. Body mass index is 29.8 kg/m.  General: Cooperative, alert, well developed, in no acute distress. HEENT: Conjunctivae and lids unremarkable. Cardiovascular: Regular rhythm.  Lungs: Normal work of breathing. Neurologic: No focal deficits.   Lab Results  Component Value Date   CREATININE 0.65 05/14/2022   BUN 15 05/14/2022   NA 140 05/14/2022   K 4.8 05/14/2022   CL 107 (H) 05/14/2022   CO2 22 05/14/2022   Lab Results  Component Value Date   ALT 23 05/14/2022   AST 21 05/14/2022   ALKPHOS 73 05/14/2022   BILITOT 0.3 05/14/2022   Lab Results  Component Value Date   HGBA1C 5.3 05/14/2022   HGBA1C 5.5 05/13/2021   HGBA1C 5.6 10/30/2020   HGBA1C 5.5 11/01/2019   HGBA1C 5.5 05/25/2019   Lab Results  Component Value Date   INSULIN 18.4 05/14/2022   INSULIN 18.9 05/13/2021   INSULIN 24.4 11/01/2019   INSULIN 24.5 05/25/2019   INSULIN 21.9 02/02/2019   Lab Results  Component Value Date   TSH 2.020 10/05/2018   Lab Results  Component Value Date   CHOL 144 10/30/2020   HDL 55.50 10/30/2020   LDLCALC 72 10/30/2020   TRIG 81.0 10/30/2020   CHOLHDL 3 10/30/2020   Lab Results  Component  Value Date   VD25OH 36.6 05/14/2022   VD25OH 91.7 05/13/2021   VD25OH 29.02 (L) 10/30/2020   Lab Results  Component Value Date   WBC 7.0 08/07/2022   HGB 9.9 (L) 08/07/2022   HCT 32.1 (L) 08/07/2022   MCV 81.1 08/07/2022   PLT 216 08/07/2022   Lab Results  Component Value Date   IRON 46 06/16/2022   TIBC 442 06/16/2022   FERRITIN 11 06/16/2022   Attestation Statements:   Reviewed by clinician on day of visit: allergies, medications, problem list, medical history, surgical history, family history, social history, and previous encounter notes.   I, Burt Knack, am  acting as transcriptionist for Reuben Likes, MD.  I have reviewed the above documentation for accuracy and completeness, and I agree with the above. - Reuben Likes, MD

## 2022-11-16 ENCOUNTER — Other Ambulatory Visit: Payer: Self-pay

## 2022-11-16 DIAGNOSIS — M79644 Pain in right finger(s): Secondary | ICD-10-CM | POA: Diagnosis not present

## 2022-11-16 DIAGNOSIS — M79645 Pain in left finger(s): Secondary | ICD-10-CM | POA: Insufficient documentation

## 2022-11-16 MED ORDER — MELOXICAM 7.5 MG PO TABS
7.5000 mg | ORAL_TABLET | Freq: Two times a day (BID) | ORAL | 0 refills | Status: DC
  Filled 2022-11-16: qty 28, 14d supply, fill #0

## 2022-11-17 ENCOUNTER — Encounter: Payer: Self-pay | Admitting: Internal Medicine

## 2022-11-24 ENCOUNTER — Inpatient Hospital Stay (HOSPITAL_BASED_OUTPATIENT_CLINIC_OR_DEPARTMENT_OTHER): Payer: 59 | Admitting: Nurse Practitioner

## 2022-11-24 ENCOUNTER — Inpatient Hospital Stay: Payer: 59

## 2022-11-24 ENCOUNTER — Inpatient Hospital Stay: Payer: 59 | Attending: Internal Medicine

## 2022-11-24 ENCOUNTER — Encounter: Payer: Self-pay | Admitting: Nurse Practitioner

## 2022-11-24 VITALS — BP 120/65 | HR 70 | Temp 98.6°F | Resp 20 | Ht 68.0 in | Wt 196.8 lb

## 2022-11-24 VITALS — BP 120/79 | HR 65

## 2022-11-24 DIAGNOSIS — K760 Fatty (change of) liver, not elsewhere classified: Secondary | ICD-10-CM | POA: Diagnosis not present

## 2022-11-24 DIAGNOSIS — Z803 Family history of malignant neoplasm of breast: Secondary | ICD-10-CM | POA: Diagnosis not present

## 2022-11-24 DIAGNOSIS — D649 Anemia, unspecified: Secondary | ICD-10-CM

## 2022-11-24 DIAGNOSIS — N920 Excessive and frequent menstruation with regular cycle: Secondary | ICD-10-CM | POA: Insufficient documentation

## 2022-11-24 DIAGNOSIS — D509 Iron deficiency anemia, unspecified: Secondary | ICD-10-CM

## 2022-11-24 DIAGNOSIS — Z800 Family history of malignant neoplasm of digestive organs: Secondary | ICD-10-CM | POA: Diagnosis not present

## 2022-11-24 LAB — CBC WITH DIFFERENTIAL (CANCER CENTER ONLY)
Abs Immature Granulocytes: 0.01 10*3/uL (ref 0.00–0.07)
Basophils Absolute: 0 10*3/uL (ref 0.0–0.1)
Basophils Relative: 1 %
Eosinophils Absolute: 0.1 10*3/uL (ref 0.0–0.5)
Eosinophils Relative: 2 %
HCT: 34.3 % — ABNORMAL LOW (ref 36.0–46.0)
Hemoglobin: 11.1 g/dL — ABNORMAL LOW (ref 12.0–15.0)
Immature Granulocytes: 0 %
Lymphocytes Relative: 28 %
Lymphs Abs: 1.8 10*3/uL (ref 0.7–4.0)
MCH: 27.1 pg (ref 26.0–34.0)
MCHC: 32.4 g/dL (ref 30.0–36.0)
MCV: 83.7 fL (ref 80.0–100.0)
Monocytes Absolute: 0.5 10*3/uL (ref 0.1–1.0)
Monocytes Relative: 7 %
Neutro Abs: 4.1 10*3/uL (ref 1.7–7.7)
Neutrophils Relative %: 62 %
Platelet Count: 223 10*3/uL (ref 150–400)
RBC: 4.1 MIL/uL (ref 3.87–5.11)
RDW: 17.6 % — ABNORMAL HIGH (ref 11.5–15.5)
WBC Count: 6.6 10*3/uL (ref 4.0–10.5)
nRBC: 0 % (ref 0.0–0.2)

## 2022-11-24 LAB — IRON AND TIBC
Iron: 28 ug/dL (ref 28–170)
Saturation Ratios: 6 % — ABNORMAL LOW (ref 10.4–31.8)
TIBC: 438 ug/dL (ref 250–450)
UIBC: 410 ug/dL

## 2022-11-24 LAB — FERRITIN: Ferritin: 7 ng/mL — ABNORMAL LOW (ref 11–307)

## 2022-11-24 MED ORDER — SODIUM CHLORIDE 0.9 % IV SOLN
200.0000 mg | Freq: Once | INTRAVENOUS | Status: AC
  Administered 2022-11-24: 200 mg via INTRAVENOUS
  Filled 2022-11-24: qty 200

## 2022-11-24 MED ORDER — SODIUM CHLORIDE 0.9 % IV SOLN
Freq: Once | INTRAVENOUS | Status: AC
  Filled 2022-11-24: qty 250

## 2022-11-24 NOTE — Progress Notes (Signed)
[facility] CONSULT NOTE  Patient Care Team: Doreene Nest, NP as PCP - General (Internal Medicine) Conard Novak, MD as Attending Physician (Obstetrics and Gynecology) Langston Reusing, MD as Consulting Physician (Family Medicine) Earna Coder, MD as Consulting Physician (Oncology)  CHIEF COMPLAINTS/PURPOSE OF CONSULTATION: ANEMIA  HEMATOLOGY HISTORY:   # Chronic iron deficient anemia JAN 2024-ferritin 6; EGD/colonoscopy/capsule 2023- NEG  # Heavy menstrual cycles  # Liver disease: fatty liver.  Hx of ERCP   Latest Reference Range & Units 05/14/22 10:14  Iron 27 - 159 ug/dL 119  UIBC 147 - 829 ug/dL 562  TIBC 130 - 865 ug/dL 784  Ferritin 15 - 696 ng/mL 7 (L)  Iron Saturation 15 - 55 % 29  (L): Data is abnormally low   Latest Reference Range & Units 05/14/22 10:14  Hemoglobin 11.1 - 15.9 g/dL 9.0 (L)  HCT 29.5 - 28.4 % 28.1 (L)  MCV 79 - 97 fL 87    HISTORY OF PRESENTING ILLNESS:  Angela [name] 47 y.o. female pleasant patient with iron deficiency anemia likely secondary to menorrhagia is here for follow-up. She is tolerating iron infusions well. Continues to have heavy menstrual periods. Feels symptomatically better overall though. Denies dyspnea, dizziness.   Review of Systems  Constitutional:  Positive for malaise/fatigue. Negative for chills, diaphoresis, fever and weight loss.  HENT:  Negative for nosebleeds and sore throat.   Eyes:  Negative for double vision.  Respiratory:  Positive for shortness of breath. Negative for cough, hemoptysis, sputum production and wheezing.   Cardiovascular:  Negative for chest pain, palpitations, orthopnea and leg swelling.  Gastrointestinal:  Negative for abdominal pain, blood in stool, constipation, diarrhea, heartburn, melena, nausea and vomiting.  Genitourinary:  Negative for dysuria, frequency and urgency.  Musculoskeletal:  Negative for back pain and joint pain.  Skin: Negative.   Negative for itching and rash.  Neurological:  Positive for dizziness. Negative for tingling, focal weakness, weakness and headaches.  Endo/Heme/Allergies:  Does not bruise/bleed easily.  Psychiatric/Behavioral:  Negative for depression. The patient is not nervous/anxious and does not have insomnia.     MEDICAL HISTORY:  Past Medical History:  Diagnosis Date   Allergy Enviromental   Anemia 08/2015   Asthma    as a child   Back pain gerd   Constipation    Dysuria 11/25/2018   Glaucoma    Heart murmur childhood   IBS (irritable bowel syndrome)    Joint pain    Menorrhagia    Palpitations    Plantar fasciitis    Prediabetes    Swelling    feet, legs   Torn meniscus    Right   Vaginal burning 11/25/2018   Voice hoarseness 07/04/2018    SURGICAL HISTORY: Past Surgical History:  Procedure Laterality Date   CHOLECYSTECTOMY  2005   COLONOSCOPY WITH PROPOFOL N/A 10/27/2018   Procedure: COLONOSCOPY WITH PROPOFOL;  Surgeon: Toney Reil, MD;  Location: [facility] ENDOSCOPY;  Service: Gastroenterology;  Laterality: N/A;   COLONOSCOPY WITH PROPOFOL N/A 10/31/2021   Procedure: COLONOSCOPY WITH PROPOFOL;  Surgeon: Toney Reil, MD;  Location[facility] ENDOSCOPY;  Service: Gastroenterology;  Laterality: N/A;   DILATATION & CURETTAGE/HYSTEROSCOPY WITH MYOSURE N/A 06/23/2018   Procedure: DILATATION & CURETTAGE/HYSTEROSCOPY WITH MYOSURE;  Surgeon: Conard Novak, MD;  Location: [facility] ORS;  Service: Gynecology;  Laterality: N/A;   DILATION AND CURETTAGE OF UTERUS     ERCP     3 times   ESOPHAGOGASTRODUODENOSCOPY (EGD)  WITH PROPOFOL  10/27/2018   Procedure: ESOPHAGOGASTRODUODENOSCOPY (EGD) WITH PROPOFOL;  Surgeon: Toney Reil, MD;  Location: [facility] ENDOSCOPY;  Service: Gastroenterology;;   EYE SURGERY     GIVENS CAPSULE STUDY N/A 12/11/2019   Procedure: GIVENS CAPSULE STUDY;  Surgeon: Toney Reil, MD;  Location: [facility] ENDOSCOPY;  Service: Gastroenterology;  Laterality: N/A;    MENISCUS REPAIR Right    x 2    SOCIAL HISTORY: Social History   Socioeconomic History   Marital status: Married    Spouse name: Emergency planning/management officer   Number of children: 0   Years of education: Not on file   Highest education level: Not on file  Occupational History   Occupation: Buyer, retail: [employer]  Tobacco Use   Smoking status: Never   Smokeless tobacco: Never  Vaping Use   Vaping status: Never Used  Substance and Sexual Activity   Alcohol use: Yes    Alcohol/week: 2.0 standard drinks of alcohol    Types: 2 Glasses of wine per week    Comment: occasional   Drug use: No   Sexual activity: Yes    Birth control/protection: None  Other Topics Concern   Not on file  Social History Narrative   Married.   No children.   Works in the [employer].   Enjoys watching movies, walking, reading.    Social Determinants of Health   Financial Resource Strain: Not on file  Food Insecurity: No Food Insecurity (06/16/2022)   Hunger Vital Sign    Worried About Running Out of Food in the Last Year: Never true    Ran Out of Food in the Last Year: Never true  Transportation Needs: No Transportation Needs (06/16/2022)   PRAPARE - Administrator, Civil Service (Medical): No    Lack of Transportation (Non-Medical): No  Physical Activity: Unknown (04/29/2018)   Exercise Vital Sign    Days of Exercise per Week: 0 days    Minutes of Exercise per Session: Not on file  Stress: Not on file  Social Connections: Not on file  Intimate Partner Violence: Not At Risk (06/16/2022)   Humiliation, Afraid, Rape, and Kick questionnaire    Fear of Current or Ex-Partner: No    Emotionally Abused: No    Physically Abused: No    Sexually Abused: No    FAMILY HISTORY: Family History  Problem Relation Age of Onset   Colon cancer Mother 1   Diabetes Mother    Hypertension Mother    Arthritis Mother    Transient ischemic attack Mother    Liver disease Mother     Obesity Mother    Diabetes Father    Heart disease Father    Breast cancer Maternal Aunt        mat great aunt   Esophageal cancer Maternal Grandmother    Cancer Maternal Grandmother    Diabetes Maternal Grandmother    Alcohol abuse Maternal Grandfather     ALLERGIES:  is allergic to quinoa-kale-hemp [alimentum].  MEDICATIONS:  Current Outpatient Medications  Medication Sig Dispense Refill   atropine 1 % ophthalmic solution Place 1 drop into the right eye daily. 5 mL 11   brimonidine-timolol (COMBIGAN) 0.2-0.5 % ophthalmic solution Place 1 drop into the right eye every 12 (twelve) hours. 10 mL 11   cetirizine (ZYRTEC) 10 MG tablet Take 10 mg by mouth daily as needed for allergies.     Cholecalciferol (VITAMIN D3) 50 MCG (2000 UT) capsule Take  1 capsule (2,000 Units total) by mouth daily.     Fiber Adult Gummies 2 g CHEW      ibuprofen (ADVIL,MOTRIN) 600 MG tablet Take 1 tablet (600 mg total) by mouth every 6 (six) hours as needed for mild pain, moderate pain or cramping. 30 tablet 0   Iron-FA-B Cmp-C-Biot-Probiotic (FUSION PLUS) CAPS Take 1 capsule by mouth daily. 90 capsule 0   linaclotide (LINZESS) 145 MCG CAPS capsule Take 1 capsule (145 mcg total) by mouth daily before breakfast. 90 capsule 3   meloxicam (MOBIC) 7.5 MG tablet Take 1 tablet (7.5 mg total) by mouth 2 (two) times daily with food. 28 tablet 0   metFORMIN (GLUCOPHAGE) 500 MG tablet Take 1 tablet (500 mg total) by mouth 2 (two) times daily with a meal. 180 tablet 0   No current facility-administered medications for this visit.    PHYSICAL EXAMINATION: Vitals:   11/24/22 1512  BP: 120/65  Pulse: 70  Resp: 20  Temp: 98.6 F (37 C)   Filed Weights   11/24/22 1512  Weight: 196 lb 12.8 oz (89.3 kg)   Physical Exam Vitals reviewed.  Constitutional:      Appearance: She is not ill-appearing.  Cardiovascular:     Rate and Rhythm: Normal rate and regular rhythm.  Pulmonary:     Effort: No respiratory distress.      Breath sounds: No wheezing.  Abdominal:     General: There is no distension.     Tenderness: There is no guarding.  Skin:    General: Skin is warm.     Coloration: Skin is not pale.  Neurological:     Mental Status: She is alert and oriented to person, place, and time.  Psychiatric:        Mood and Affect: Mood normal.        Behavior: Behavior normal.     LABORATORY DATA:  I have reviewed the data as listed Lab Results  Component Value Date   WBC 7.0 08/07/2022   HGB 9.9 (L) 08/07/2022   HCT 32.1 (L) 08/07/2022   MCV 81.1 08/07/2022   PLT 216 08/07/2022   Recent Labs    05/14/22 1014  NA 140  K 4.8  CL 107*  CO2 22  GLUCOSE 85  BUN 15  CREATININE 0.65  CALCIUM 8.9  PROT 6.6  ALBUMIN 4.2  AST 21  ALT 23  ALKPHOS 73  BILITOT 0.3   Iron/TIBC/Ferritin/ %Sat    Component Value Date/Time   IRON 28 11/24/2022 1455   IRON 116 05/14/2022 1014   TIBC 438 11/24/2022 1455   TIBC 398 05/14/2022 1014   FERRITIN 7 (L) 11/24/2022 1455   FERRITIN 7 (L) 05/14/2022 1014   IRONPCTSAT 6 (L) 11/24/2022 1455   IRONPCTSAT 29 05/14/2022 1014   IRONPCTSAT 7 (L) 09/08/2016 1109   No results found.  ASSESSMENT & PLAN:   # Symptomatic anemia d/t iron deficiency- secondary to menorrhagia. Continue oral iron biglycinate. Tolerating well. Given ongoing iron deficiency however, she was felt to benefit from IV iron. Started venofer. Tolerating well. Hemoglobin has improved but persistent anemia and decreased iron stores. Recommend venofer 200 mg today and 4 additional infusions. Given menorrhagia she will likely need ongoing maintenance venofer   # Etiology of iron deficiency: Likely secondary to menorrhagia [Dr.jackson].  EGD colonoscopy capsule- ? 2023- NEG.   DISPOSITION: Venofer today then venofer weekly x 4 for total of 5 infusions. 3 mo- lab (cbc, ferritin, iron studies), see Dr  Donneta Romberg, +/- venofer- la  No problem-specific Assessment & Plan notes found for this  encounter.  All questions were answered. The patient knows to call the clinic with any problems, questions or concerns.  Alinda Dooms, NP 11/24/2022

## 2022-11-24 NOTE — Patient Instructions (Signed)
Iron Sucrose Injection What is this medication? IRON SUCROSE (EYE ern SOO krose) treats low levels of iron (iron deficiency anemia) in people with kidney disease. Iron is a mineral that plays an important role in making red blood cells, which carry oxygen from your lungs to the rest of your body. This medicine may be used for other purposes; ask your health care provider or pharmacist if you have questions. COMMON BRAND NAME(S): Venofer What should I tell my care team before I take this medication? They need to know if you have any of these conditions: Anemia not caused by low iron levels Heart disease High levels of iron in the blood Kidney disease Liver disease An unusual or allergic reaction to iron, other medications, foods, dyes, or preservatives Pregnant or trying to get pregnant Breastfeeding How should I use this medication? This medication is for infusion into a vein. It is given in a hospital or clinic setting. Talk to your care team about the use of this medication in children. While this medication may be prescribed for children as young as 2 years for selected conditions, precautions do apply. Overdosage: If you think you have taken too much of this medicine contact a poison control center or emergency room at once. NOTE: This medicine is only for you. Do not share this medicine with others. What if I miss a dose? Keep appointments for follow-up doses. It is important not to miss your dose. Call your care team if you are unable to keep an appointment. What may interact with this medication? Do not take this medication with any of the following: Deferoxamine Dimercaprol Other iron products This medication may also interact with the following: Chloramphenicol Deferasirox This list may not describe all possible interactions. Give your health care provider a list of all the medicines, herbs, non-prescription drugs, or dietary supplements you use. Also tell them if you smoke,  drink alcohol, or use illegal drugs. Some items may interact with your medicine. What should I watch for while using this medication? Visit your care team regularly. Tell your care team if your symptoms do not start to get better or if they get worse. You may need blood work done while you are taking this medication. You may need to follow a special diet. Talk to your care team. Foods that contain iron include: whole grains/cereals, dried fruits, beans, or peas, leafy green vegetables, and organ meats (liver, kidney). What side effects may I notice from receiving this medication? Side effects that you should report to your care team as soon as possible: Allergic reactions--skin rash, itching, hives, swelling of the face, lips, tongue, or throat Low blood pressure--dizziness, feeling faint or lightheaded, blurry vision Shortness of breath Side effects that usually do not require medical attention (report to your care team if they continue or are bothersome): Flushing Headache Joint pain Muscle pain Nausea Pain, redness, or irritation at injection site This list may not describe all possible side effects. Call your doctor for medical advice about side effects. You may report side effects to FDA at 1-800-FDA-1088. Where should I keep my medication? This medication is given in a hospital or clinic. It will not be stored at home. NOTE: This sheet is a summary. It may not cover all possible information. If you have questions about this medicine, talk to your doctor, pharmacist, or health care provider.  2024 Elsevier/Gold Standard (2022-09-11 00:00:00)

## 2022-12-01 ENCOUNTER — Other Ambulatory Visit: Payer: Self-pay

## 2022-12-01 ENCOUNTER — Encounter: Payer: Self-pay | Admitting: Internal Medicine

## 2022-12-02 ENCOUNTER — Ambulatory Visit (INDEPENDENT_AMBULATORY_CARE_PROVIDER_SITE_OTHER): Payer: 59 | Admitting: Primary Care

## 2022-12-02 ENCOUNTER — Encounter: Payer: Self-pay | Admitting: Primary Care

## 2022-12-02 VITALS — BP 118/66 | HR 78 | Temp 97.6°F | Ht 68.0 in | Wt 199.8 lb

## 2022-12-02 DIAGNOSIS — Z800 Family history of malignant neoplasm of digestive organs: Secondary | ICD-10-CM | POA: Diagnosis not present

## 2022-12-02 DIAGNOSIS — R7303 Prediabetes: Secondary | ICD-10-CM

## 2022-12-02 DIAGNOSIS — D509 Iron deficiency anemia, unspecified: Secondary | ICD-10-CM | POA: Diagnosis not present

## 2022-12-02 DIAGNOSIS — Z0000 Encounter for general adult medical examination without abnormal findings: Secondary | ICD-10-CM | POA: Diagnosis not present

## 2022-12-02 DIAGNOSIS — H409 Unspecified glaucoma: Secondary | ICD-10-CM

## 2022-12-02 DIAGNOSIS — K5909 Other constipation: Secondary | ICD-10-CM

## 2022-12-02 DIAGNOSIS — N920 Excessive and frequent menstruation with regular cycle: Secondary | ICD-10-CM | POA: Diagnosis not present

## 2022-12-02 NOTE — Assessment & Plan Note (Signed)
Immunizations UTD. Pap smear UTD. Mammogram UTD and scheduled. Colonoscopy UTD, due 2027  Discussed the importance of a healthy diet and regular exercise in order for weight loss, and to reduce the risk of further co-morbidity.  Exam stable. Labs pending.  Follow up in 1 year for repeat physical.

## 2022-12-02 NOTE — Assessment & Plan Note (Signed)
Stable. Following with healthy weight and wellness.  Continue metformin 500 mg BID.

## 2022-12-02 NOTE — Assessment & Plan Note (Signed)
Intermittent. Perimenopausal.   Continue iron infusions.

## 2022-12-02 NOTE — Assessment & Plan Note (Signed)
Controlled.  Continue Linzezz 145 mcg daily PRN. Following with GI.

## 2022-12-02 NOTE — Progress Notes (Signed)
Subjective:    Patient ID: Angela [name], female    DOB: 1976/02/24, 47 y.o.   MRN: [id number]  HPI  Angela [name] is a very pleasant 47 y.o. female who presents today for complete physical and follow up of chronic conditions.  Immunizations: -Tetanus: Completed in 2016  Diet: Fair diet.  Exercise: No regular exercise.  Eye exam: Completes annually  Dental exam: Completes semi-annually    Pap Smear: August 2023 Mammogram: February 2024, scheduled for September 2024  Colonoscopy: Completed in 2023, due 2028  BP Readings from Last 3 Encounters:  12/02/22 118/66  11/24/22 120/79  11/24/22 120/65        Review of Systems  Constitutional:  Negative for unexpected weight change.  HENT:  Negative for rhinorrhea.   Respiratory:  Negative for cough and shortness of breath.   Cardiovascular:  Negative for chest pain.  Gastrointestinal:  Negative for constipation and diarrhea.  Genitourinary:  Positive for menstrual problem. Negative for difficulty urinating.  Musculoskeletal:  Positive for arthralgias. Negative for myalgias.  Skin:  Negative for rash.  Allergic/Immunologic: Negative for environmental allergies.  Neurological:  Negative for dizziness and headaches.  Psychiatric/Behavioral:  The patient is not nervous/anxious.          Past Medical History:  Diagnosis Date   Allergy Enviromental   Anemia 08/2015   Asthma    as a child   Back pain gerd   Constipation    Dysuria 11/25/2018   Glaucoma    Heart murmur childhood   IBS (irritable bowel syndrome)    Joint pain    Menorrhagia    Muscle spasm 07/04/2018   Palpitations    Plantar fasciitis    Prediabetes    Swelling    feet, legs   Torn meniscus    Right   Vaginal burning 11/25/2018   Voice hoarseness 07/04/2018    Social History   Socioeconomic History   Marital status: Married    Spouse name: Emergency planning/management officer   Number of children: 0   Years of education: Not on file    Highest education level: Not on file  Occupational History   Occupation: Buyer, retail: [employer]  Tobacco Use   Smoking status: Never   Smokeless tobacco: Never  Vaping Use   Vaping status: Never Used  Substance and Sexual Activity   Alcohol use: Yes    Alcohol/week: 2.0 standard drinks of alcohol    Types: 2 Glasses of wine per week    Comment: occasional   Drug use: No   Sexual activity: Yes    Birth control/protection: None  Other Topics Concern   Not on file  Social History Narrative   Married.   No children.   Works in the [employer].   Enjoys watching movies, walking, reading.    Social Determinants of Health   Financial Resource Strain: Not on file  Food Insecurity: No Food Insecurity (06/16/2022)   Hunger Vital Sign    Worried About Running Out of Food in the Last Year: Never true    Ran Out of Food in the Last Year: Never true  Transportation Needs: No Transportation Needs (06/16/2022)   PRAPARE - Administrator, Civil Service (Medical): No    Lack of Transportation (Non-Medical): No  Physical Activity: Unknown (04/29/2018)   Exercise Vital Sign    Days of Exercise per Week: 0 days    Minutes of Exercise per Session: Not on file  Stress:  Not on file  Social Connections: Not on file  Intimate Partner Violence: Not At Risk (06/16/2022)   Humiliation, Afraid, Rape, and Kick questionnaire    Fear of Current or Ex-Partner: No    Emotionally Abused: No    Physically Abused: No    Sexually Abused: No    Past Surgical History:  Procedure Laterality Date   CHOLECYSTECTOMY  2005   COLONOSCOPY WITH PROPOFOL N/A 10/27/2018   Procedure: COLONOSCOPY WITH PROPOFOL;  Surgeon: Toney Reil, MD;  Location: [facility] ENDOSCOPY;  Service: Gastroenterology;  Laterality: N/A;   COLONOSCOPY WITH PROPOFOL N/A 10/31/2021   Procedure: COLONOSCOPY WITH PROPOFOL;  Surgeon: Toney Reil, MD;  Location: [facility] ENDOSCOPY;  Service:  Gastroenterology;  Laterality: N/A;   DILATATION & CURETTAGE/HYSTEROSCOPY WITH MYOSURE N/A 06/23/2018   Procedure: DILATATION & CURETTAGE/HYSTEROSCOPY WITH MYOSURE;  Surgeon: Conard Novak, MD;  Location: [facility] ORS;  Service: Gynecology;  Laterality: N/A;   DILATION AND CURETTAGE OF UTERUS     ERCP     3 times   ESOPHAGOGASTRODUODENOSCOPY (EGD) WITH PROPOFOL  10/27/2018   Procedure: ESOPHAGOGASTRODUODENOSCOPY (EGD) WITH PROPOFOL;  Surgeon: Toney Reil, MD;  Location: [facility] ENDOSCOPY;  Service: Gastroenterology;;   EYE SURGERY     GIVENS CAPSULE STUDY N/A 12/11/2019   Procedure: GIVENS CAPSULE STUDY;  Surgeon: Toney Reil, MD;  Location: [facility] ENDOSCOPY;  Service: Gastroenterology;  Laterality: N/A;   MENISCUS REPAIR Right    x 2    Family History  Problem Relation Age of Onset   Colon cancer Mother 26   Diabetes Mother    Hypertension Mother    Arthritis Mother    Transient ischemic attack Mother    Liver disease Mother    Obesity Mother    Diabetes Father    Heart disease Father    Breast cancer Maternal Aunt        mat great aunt   Esophageal cancer Maternal Grandmother    Cancer Maternal Grandmother    Diabetes Maternal Grandmother    Alcohol abuse Maternal Grandfather     Allergies  Allergen Reactions   Quinoa-Kale-Hemp [Alimentum]     Current Outpatient Medications on File Prior to Visit  Medication Sig Dispense Refill   atropine 1 % ophthalmic solution Place 1 drop into the right eye daily. 5 mL 11   brimonidine-timolol (COMBIGAN) 0.2-0.5 % ophthalmic solution Place 1 drop into the right eye every 12 (twelve) hours. 10 mL 11   cetirizine (ZYRTEC) 10 MG tablet Take 10 mg by mouth daily as needed for allergies.     Cholecalciferol (VITAMIN D3) 50 MCG (2000 UT) capsule Take 1 capsule (2,000 Units total) by mouth daily.     Fiber Adult Gummies 2 g CHEW      ibuprofen (ADVIL,MOTRIN) 600 MG tablet Take 1 tablet (600 mg total) by mouth every 6 (six) hours as  needed for mild pain, moderate pain or cramping. (Patient not taking: Reported on 11/24/2022) 30 tablet 0   Iron-FA-B Cmp-C-Biot-Probiotic (FUSION PLUS) CAPS Take 1 capsule by mouth daily. 90 capsule 0   linaclotide (LINZESS) 145 MCG CAPS capsule Take 1 capsule (145 mcg total) by mouth daily before breakfast. 90 capsule 3   meloxicam (MOBIC) 7.5 MG tablet Take 1 tablet (7.5 mg total) by mouth 2 (two) times daily with food. 28 tablet 0   metFORMIN (GLUCOPHAGE) 500 MG tablet Take 1 tablet (500 mg total) by mouth 2 (two) times daily with a meal. 180 tablet 0   No  current facility-administered medications on file prior to visit.    BP 118/66   Pulse 78   Temp 97.6 F (36.4 C) (Temporal)   Ht 5\' 8"  (1.727 m)   Wt 199 lb 12.8 oz (90.6 kg)   SpO2 99%   BMI 30.38 kg/m  Objective:   Physical Exam HENT:     Right Ear: Tympanic membrane and ear canal normal.     Left Ear: Tympanic membrane and ear canal normal.     Nose: Nose normal.  Eyes:     Conjunctiva/sclera: Conjunctivae normal.     Pupils: Pupils are equal, round, and reactive to light.  Neck:     Thyroid: No thyromegaly.  Cardiovascular:     Rate and Rhythm: Normal rate and regular rhythm.     Heart sounds: No murmur heard. Pulmonary:     Effort: Pulmonary effort is normal.     Breath sounds: Normal breath sounds. No rales.  Abdominal:     General: Bowel sounds are normal.     Palpations: Abdomen is soft.     Tenderness: There is no abdominal tenderness.  Musculoskeletal:        General: Normal range of motion.     Cervical back: Neck supple.  Lymphadenopathy:     Cervical: No cervical adenopathy.  Skin:    General: Skin is warm and dry.     Findings: No rash.  Neurological:     Mental Status: She is alert and oriented to person, place, and time.     Cranial Nerves: No cranial nerve deficit.     Deep Tendon Reflexes: Reflexes are normal and symmetric.  Psychiatric:        Mood and Affect: Mood normal.            Assessment & Plan:  Preventative health care Assessment & Plan: Immunizations UTD. Pap smear UTD. Mammogram UTD and scheduled. Colonoscopy UTD, due 2027  Discussed the importance of a healthy diet and regular exercise in order for weight loss, and to reduce the risk of further co-morbidity.  Exam stable. Labs pending.  Follow up in 1 year for repeat physical.    Chronic constipation Assessment & Plan: Controlled.  Continue Linzezz 145 mcg daily PRN. Following with GI.   Iron deficiency anemia, unspecified iron deficiency anemia type Assessment & Plan: Following with hematology. Reviewed office notes from August 2024.  Continue iron infusions.   Family history of colon cancer in mother Assessment & Plan: Colonoscopy UTD, due for recall in 2027.   Prediabetes Assessment & Plan: Stable. Following with healthy weight and wellness.  Continue metformin 500 mg BID.  Orders: -     Lipid panel; Future -     Hemoglobin A1c; Future  Menorrhagia with regular cycle Assessment & Plan: Intermittent. Perimenopausal.   Continue iron infusions.   Glaucoma, unspecified glaucoma type, unspecified laterality Assessment & Plan: Following with ophthalmology.  Continue Combigan 0.2-0.5% drops, atropine 1% drops.         Doreene Nest, NP

## 2022-12-02 NOTE — Assessment & Plan Note (Signed)
Colonoscopy UTD, due for recall in 2027.

## 2022-12-02 NOTE — Assessment & Plan Note (Signed)
Following with ophthalmology.  Continue Combigan 0.2-0.5% drops, atropine 1% drops.

## 2022-12-02 NOTE — Assessment & Plan Note (Signed)
Following with hematology. Reviewed office notes from August 2024.  Continue iron infusions.

## 2022-12-02 NOTE — Patient Instructions (Signed)
Schedule the lab appointment as discussed.  Complete your mammogram next month.  It was a pleasure to see you today!

## 2022-12-03 ENCOUNTER — Inpatient Hospital Stay: Payer: 59

## 2022-12-03 VITALS — BP 119/84 | HR 82 | Resp 16

## 2022-12-03 DIAGNOSIS — K760 Fatty (change of) liver, not elsewhere classified: Secondary | ICD-10-CM | POA: Diagnosis not present

## 2022-12-03 DIAGNOSIS — D649 Anemia, unspecified: Secondary | ICD-10-CM

## 2022-12-03 DIAGNOSIS — Z800 Family history of malignant neoplasm of digestive organs: Secondary | ICD-10-CM | POA: Diagnosis not present

## 2022-12-03 DIAGNOSIS — D509 Iron deficiency anemia, unspecified: Secondary | ICD-10-CM | POA: Diagnosis not present

## 2022-12-03 DIAGNOSIS — Z803 Family history of malignant neoplasm of breast: Secondary | ICD-10-CM | POA: Diagnosis not present

## 2022-12-03 DIAGNOSIS — N920 Excessive and frequent menstruation with regular cycle: Secondary | ICD-10-CM | POA: Diagnosis not present

## 2022-12-03 MED ORDER — SODIUM CHLORIDE 0.9 % IV SOLN
Freq: Once | INTRAVENOUS | Status: AC
  Filled 2022-12-03: qty 250

## 2022-12-03 MED ORDER — SODIUM CHLORIDE 0.9 % IV SOLN
200.0000 mg | Freq: Once | INTRAVENOUS | Status: AC
  Administered 2022-12-03: 200 mg via INTRAVENOUS
  Filled 2022-12-03: qty 200

## 2022-12-09 ENCOUNTER — Encounter: Payer: Self-pay | Admitting: Internal Medicine

## 2022-12-10 ENCOUNTER — Inpatient Hospital Stay: Payer: 59

## 2022-12-10 ENCOUNTER — Other Ambulatory Visit (INDEPENDENT_AMBULATORY_CARE_PROVIDER_SITE_OTHER): Payer: 59

## 2022-12-10 VITALS — BP 117/75 | HR 79 | Temp 99.0°F | Resp 18

## 2022-12-10 DIAGNOSIS — R7303 Prediabetes: Secondary | ICD-10-CM

## 2022-12-10 DIAGNOSIS — K760 Fatty (change of) liver, not elsewhere classified: Secondary | ICD-10-CM | POA: Diagnosis not present

## 2022-12-10 DIAGNOSIS — D509 Iron deficiency anemia, unspecified: Secondary | ICD-10-CM | POA: Diagnosis not present

## 2022-12-10 DIAGNOSIS — N920 Excessive and frequent menstruation with regular cycle: Secondary | ICD-10-CM | POA: Diagnosis not present

## 2022-12-10 DIAGNOSIS — D649 Anemia, unspecified: Secondary | ICD-10-CM

## 2022-12-10 DIAGNOSIS — Z800 Family history of malignant neoplasm of digestive organs: Secondary | ICD-10-CM | POA: Diagnosis not present

## 2022-12-10 DIAGNOSIS — Z803 Family history of malignant neoplasm of breast: Secondary | ICD-10-CM | POA: Diagnosis not present

## 2022-12-10 LAB — LIPID PANEL
Cholesterol: 147 mg/dL (ref 0–200)
HDL: 51 mg/dL (ref >39.00)
LDL Cholesterol: 69 mg/dL (ref 0–99)
NonHDL: 95.72
Total CHOL/HDL Ratio: 3
Triglycerides: 135 mg/dL (ref 0.0–149.0)
VLDL: 27 mg/dL (ref 0.0–40.0)

## 2022-12-10 LAB — HEMOGLOBIN A1C: Hgb A1c MFr Bld: 5.4 % (ref 4.6–6.5)

## 2022-12-10 MED ORDER — SODIUM CHLORIDE 0.9 % IV SOLN
Freq: Once | INTRAVENOUS | Status: AC
  Filled 2022-12-10: qty 250

## 2022-12-10 MED ORDER — SODIUM CHLORIDE 0.9 % IV SOLN
200.0000 mg | Freq: Once | INTRAVENOUS | Status: AC
  Administered 2022-12-10: 200 mg via INTRAVENOUS
  Filled 2022-12-10: qty 200

## 2022-12-11 ENCOUNTER — Encounter (INDEPENDENT_AMBULATORY_CARE_PROVIDER_SITE_OTHER): Payer: Self-pay | Admitting: Family Medicine

## 2022-12-15 ENCOUNTER — Other Ambulatory Visit (INDEPENDENT_AMBULATORY_CARE_PROVIDER_SITE_OTHER): Payer: Self-pay | Admitting: Family Medicine

## 2022-12-15 ENCOUNTER — Other Ambulatory Visit (HOSPITAL_COMMUNITY): Payer: Self-pay

## 2022-12-15 DIAGNOSIS — R7303 Prediabetes: Secondary | ICD-10-CM

## 2022-12-15 MED ORDER — METFORMIN HCL 500 MG PO TABS
500.0000 mg | ORAL_TABLET | Freq: Two times a day (BID) | ORAL | 0 refills | Status: DC
Start: 2022-12-15 — End: 2023-03-24
  Filled 2022-12-15: qty 180, 90d supply, fill #0

## 2022-12-15 NOTE — Telephone Encounter (Signed)
Approved by Dr Lawson Radar

## 2022-12-17 ENCOUNTER — Ambulatory Visit (INDEPENDENT_AMBULATORY_CARE_PROVIDER_SITE_OTHER): Payer: 59 | Admitting: Family Medicine

## 2022-12-18 ENCOUNTER — Inpatient Hospital Stay: Payer: 59

## 2022-12-18 VITALS — BP 126/73 | HR 80 | Temp 98.2°F | Resp 18

## 2022-12-18 DIAGNOSIS — Z803 Family history of malignant neoplasm of breast: Secondary | ICD-10-CM | POA: Diagnosis not present

## 2022-12-18 DIAGNOSIS — D649 Anemia, unspecified: Secondary | ICD-10-CM

## 2022-12-18 DIAGNOSIS — K760 Fatty (change of) liver, not elsewhere classified: Secondary | ICD-10-CM | POA: Diagnosis not present

## 2022-12-18 DIAGNOSIS — N920 Excessive and frequent menstruation with regular cycle: Secondary | ICD-10-CM | POA: Diagnosis not present

## 2022-12-18 DIAGNOSIS — D509 Iron deficiency anemia, unspecified: Secondary | ICD-10-CM | POA: Diagnosis not present

## 2022-12-18 DIAGNOSIS — Z800 Family history of malignant neoplasm of digestive organs: Secondary | ICD-10-CM | POA: Diagnosis not present

## 2022-12-18 MED ORDER — SODIUM CHLORIDE 0.9 % IV SOLN
200.0000 mg | Freq: Once | INTRAVENOUS | Status: AC
  Administered 2022-12-18: 200 mg via INTRAVENOUS
  Filled 2022-12-18: qty 200

## 2022-12-18 MED ORDER — SODIUM CHLORIDE 0.9 % IV SOLN
Freq: Once | INTRAVENOUS | Status: AC
  Filled 2022-12-18: qty 250

## 2022-12-18 NOTE — Patient Instructions (Signed)
 Iron Sucrose Injection What is this medication? IRON SUCROSE (EYE ern SOO krose) treats low levels of iron (iron deficiency anemia) in people with kidney disease. Iron is a mineral that plays an important role in making red blood cells, which carry oxygen from your lungs to the rest of your body. This medicine may be used for other purposes; ask your health care provider or pharmacist if you have questions. COMMON BRAND NAME(S): Venofer What should I tell my care team before I take this medication? They need to know if you have any of these conditions: Anemia not caused by low iron levels Heart disease High levels of iron in the blood Kidney disease Liver disease An unusual or allergic reaction to iron, other medications, foods, dyes, or preservatives Pregnant or trying to get pregnant Breastfeeding How should I use this medication? This medication is for infusion into a vein. It is given in a hospital or clinic setting. Talk to your care team about the use of this medication in children. While this medication may be prescribed for children as young as 2 years for selected conditions, precautions do apply. Overdosage: If you think you have taken too much of this medicine contact a poison control center or emergency room at once. NOTE: This medicine is only for you. Do not share this medicine with others. What if I miss a dose? Keep appointments for follow-up doses. It is important not to miss your dose. Call your care team if you are unable to keep an appointment. What may interact with this medication? Do not take this medication with any of the following: Deferoxamine Dimercaprol Other iron products This medication may also interact with the following: Chloramphenicol Deferasirox This list may not describe all possible interactions. Give your health care provider a list of all the medicines, herbs, non-prescription drugs, or dietary supplements you use. Also tell them if you smoke,  drink alcohol, or use illegal drugs. Some items may interact with your medicine. What should I watch for while using this medication? Visit your care team regularly. Tell your care team if your symptoms do not start to get better or if they get worse. You may need blood work done while you are taking this medication. You may need to follow a special diet. Talk to your care team. Foods that contain iron include: whole grains/cereals, dried fruits, beans, or peas, leafy green vegetables, and organ meats (liver, kidney). What side effects may I notice from receiving this medication? Side effects that you should report to your care team as soon as possible: Allergic reactions--skin rash, itching, hives, swelling of the face, lips, tongue, or throat Low blood pressure--dizziness, feeling faint or lightheaded, blurry vision Shortness of breath Side effects that usually do not require medical attention (report to your care team if they continue or are bothersome): Flushing Headache Joint pain Muscle pain Nausea Pain, redness, or irritation at injection site This list may not describe all possible side effects. Call your doctor for medical advice about side effects. You may report side effects to FDA at 1-800-FDA-1088. Where should I keep my medication? This medication is given in a hospital or clinic. It will not be stored at home. NOTE: This sheet is a summary. It may not cover all possible information. If you have questions about this medicine, talk to your doctor, pharmacist, or health care provider.  2024 Elsevier/Gold Standard (2022-09-11 00:00:00)

## 2022-12-22 ENCOUNTER — Ambulatory Visit
Admission: RE | Admit: 2022-12-22 | Discharge: 2022-12-22 | Disposition: A | Discharge status: Home / Self Care | Payer: 59 | Source: Ambulatory Visit | Attending: Primary Care | Admitting: Primary Care

## 2022-12-22 ENCOUNTER — Encounter: Payer: Self-pay | Admitting: Radiology

## 2022-12-22 DIAGNOSIS — Z1231 Encounter for screening mammogram for malignant neoplasm of breast: Secondary | ICD-10-CM | POA: Diagnosis not present

## 2022-12-22 DIAGNOSIS — N63 Unspecified lump in unspecified breast: Secondary | ICD-10-CM | POA: Insufficient documentation

## 2022-12-22 DIAGNOSIS — R928 Other abnormal and inconclusive findings on diagnostic imaging of breast: Secondary | ICD-10-CM | POA: Diagnosis not present

## 2022-12-22 DIAGNOSIS — N6312 Unspecified lump in the right breast, upper inner quadrant: Secondary | ICD-10-CM | POA: Diagnosis not present

## 2022-12-22 DIAGNOSIS — R92333 Mammographic heterogeneous density, bilateral breasts: Secondary | ICD-10-CM | POA: Diagnosis not present

## 2022-12-24 ENCOUNTER — Encounter: Payer: Self-pay | Admitting: Internal Medicine

## 2022-12-24 ENCOUNTER — Other Ambulatory Visit: Payer: Self-pay

## 2022-12-25 ENCOUNTER — Inpatient Hospital Stay: Payer: 59 | Attending: Internal Medicine

## 2022-12-25 ENCOUNTER — Other Ambulatory Visit: Payer: Self-pay | Admitting: Internal Medicine

## 2022-12-25 ENCOUNTER — Inpatient Hospital Stay: Payer: 59

## 2022-12-25 VITALS — BP 125/70 | HR 80 | Temp 99.3°F | Resp 18

## 2022-12-25 DIAGNOSIS — D509 Iron deficiency anemia, unspecified: Secondary | ICD-10-CM | POA: Diagnosis not present

## 2022-12-25 DIAGNOSIS — D649 Anemia, unspecified: Secondary | ICD-10-CM

## 2022-12-25 MED ORDER — SODIUM CHLORIDE 0.9 % IV SOLN
Freq: Once | INTRAVENOUS | Status: AC
  Filled 2022-12-25: qty 250

## 2022-12-25 MED ORDER — SODIUM CHLORIDE 0.9 % IV SOLN
200.0000 mg | Freq: Once | INTRAVENOUS | Status: AC
  Administered 2022-12-25: 200 mg via INTRAVENOUS
  Filled 2022-12-25: qty 200

## 2022-12-28 ENCOUNTER — Other Ambulatory Visit: Payer: Self-pay

## 2022-12-28 DIAGNOSIS — M79645 Pain in left finger(s): Secondary | ICD-10-CM | POA: Diagnosis not present

## 2022-12-28 MED ORDER — MELOXICAM 15 MG PO TABS
15.0000 mg | ORAL_TABLET | Freq: Every day | ORAL | 2 refills | Status: DC
  Filled 2022-12-28: qty 90, 90d supply, fill #0

## 2023-01-18 ENCOUNTER — Ambulatory Visit (INDEPENDENT_AMBULATORY_CARE_PROVIDER_SITE_OTHER): Payer: 59 | Admitting: Adult Health

## 2023-01-20 ENCOUNTER — Encounter: Payer: Self-pay | Admitting: Internal Medicine

## 2023-01-26 ENCOUNTER — Encounter: Payer: Self-pay | Admitting: Internal Medicine

## 2023-01-27 ENCOUNTER — Encounter: Payer: Self-pay | Admitting: Primary Care

## 2023-01-27 ENCOUNTER — Telehealth (INDEPENDENT_AMBULATORY_CARE_PROVIDER_SITE_OTHER): Payer: 59 | Admitting: Primary Care

## 2023-01-27 ENCOUNTER — Ambulatory Visit (INDEPENDENT_AMBULATORY_CARE_PROVIDER_SITE_OTHER): Payer: 59 | Admitting: Internal Medicine

## 2023-01-27 ENCOUNTER — Other Ambulatory Visit: Payer: Self-pay | Admitting: Primary Care

## 2023-01-27 ENCOUNTER — Other Ambulatory Visit (HOSPITAL_COMMUNITY): Payer: Self-pay

## 2023-01-27 DIAGNOSIS — E66811 Obesity, class 1: Secondary | ICD-10-CM | POA: Diagnosis not present

## 2023-01-27 DIAGNOSIS — E6609 Other obesity due to excess calories: Secondary | ICD-10-CM

## 2023-01-27 DIAGNOSIS — Z6830 Body mass index (BMI) 30.0-30.9, adult: Secondary | ICD-10-CM

## 2023-01-27 MED ORDER — TIRZEPATIDE-WEIGHT MANAGEMENT 2.5 MG/0.5ML ~~LOC~~ SOLN
2.5000 mg | SUBCUTANEOUS | 0 refills | Status: DC
Start: 2023-01-27 — End: 2023-01-28
  Filled 2023-01-27: qty 2, 28d supply, fill #0

## 2023-01-27 NOTE — Patient Instructions (Signed)
Start tirzepitide (Zepbound) for weight loss. Start by injecting 2.5 mg into the skin once weekly for 4 weeks, then increase to 5 mg once weekly thereafter. Please notify me once you've used your last 2.5 mg pen so that I can prescribe the next dose.   Schedule a follow-up visit for 3 months.  It was a pleasure to see you today!

## 2023-01-27 NOTE — Progress Notes (Signed)
Patient ID: Angela [name], female    DOB: 03-19-1976, 46 y.o.   MRN: [id number]  Virtual visit completed through caregility, a video enabled telemedicine application. Due to national recommendations of social distancing due to COVID-19, a virtual visit is felt to be most appropriate for this patient at this time. Reviewed limitations, risks, security and privacy concerns of performing a virtual visit and the availability of in person appointments. I also reviewed that there may be a patient responsible charge related to this service. The patient agreed to proceed.   Patient location: home Provider location: [place] [facility], office Persons participating in this virtual visit: patient, provider  If any vitals were documented, they were collected by patient at home unless specified below.    There were no vitals taken for this visit.   CC: Discuss obesity and GLP-1 management Subjective:   HPI: Angela [name] is a 47 y.o. female with a history of chronic constipation, iron deficiency anemia, prediabetes, glaucoma, anemia, class I obesity presenting on 01/27/2023 for Weight Loss (Would like to discuss weight management)  Previously following with healthy weight and wellness center in [place].  Starting weight of 220 pounds, last weight from their office was 196 pounds.  Previously managed on Wegovy,up to 2.4 mg for several years, but this became cost prohibitive so she had to discontinue in May 2024.  She was initiated on Metformin in June 2024. Since coming of Wegovy her cravings have increased and she's noticed not feeling full as quickly. Her lowest weight when taking Wegovy was 183. She would like to try Zepbound as the cost has decreased. She plans to pay out of pocket.   She continues to follow a healthy diet and is active with walking daily. Her last documented weight in our system was 199.8 from August of 2024 with BMI of 30.38.  Well-managed on Wegovy she  tolerated it well.      Relevant past medical, surgical, family and social history reviewed and updated as indicated. Interim medical history since our last visit reviewed. Allergies and medications reviewed and updated. Outpatient Medications Prior to Visit  Medication Sig Dispense Refill   atropine 1 % ophthalmic solution Place 1 drop into the right eye daily. 5 mL 11   brimonidine-timolol (COMBIGAN) 0.2-0.5 % ophthalmic solution Place 1 drop into the right eye every 12 (twelve) hours. 10 mL 11   cetirizine (ZYRTEC) 10 MG tablet Take 10 mg by mouth daily as needed for allergies.     Cholecalciferol (VITAMIN D3) 50 MCG (2000 UT) capsule Take 1 capsule (2,000 Units total) by mouth daily.     Fiber Adult Gummies 2 g CHEW      linaclotide (LINZESS) 145 MCG CAPS capsule Take 1 capsule (145 mcg total) by mouth daily before breakfast. 90 capsule 3   metFORMIN (GLUCOPHAGE) 500 MG tablet Take 1 tablet (500 mg total) by mouth 2 (two) times daily with a meal. 180 tablet 0   Iron-FA-B Cmp-C-Biot-Probiotic (FUSION PLUS) CAPS Take 1 capsule by mouth daily. (Patient not taking: Reported on 01/27/2023) 90 capsule 0   meloxicam (MOBIC) 15 MG tablet Take 1 tablet (15 mg total) by mouth daily with a meal. (Patient not taking: Reported on 01/27/2023) 90 tablet 2   meloxicam (MOBIC) 7.5 MG tablet Take 1 tablet (7.5 mg total) by mouth 2 (two) times daily with food. (Patient not taking: Reported on 01/27/2023) 28 tablet 0   No facility-administered medications prior to visit.     Per  HPI unless specifically indicated in ROS section below Review of Systems  Respiratory:  Negative for shortness of breath.   Cardiovascular:  Negative for chest pain.  Gastrointestinal:  Negative for abdominal pain, constipation and diarrhea.   Objective:  There were no vitals taken for this visit.  Wt Readings from Last 3 Encounters:  12/02/22 199 lb 12.8 oz (90.6 kg)  11/24/22 196 lb 12.8 oz (89.3 kg)  11/11/22 196 lb (88.9  kg)       Physical exam: General: Alert and oriented x 3, no distress, does not appear sickly  Pulmonary: Speaks in complete sentences without increased work of breathing, no cough during visit.  Psychiatric: Normal mood, thought content, and behavior.     Results for orders placed or performed in visit on 12/10/22  Hemoglobin A1c  Result Value Ref Range   Hgb A1c MFr Bld 5.4 4.6 - 6.5 %  Lipid panel  Result Value Ref Range   Cholesterol 147 0 - 200 mg/dL   Triglycerides 045.4 0.0 - 149.0 mg/dL   HDL 09.81 >19.14 mg/dL   VLDL 78.2 0.0 - 95.6 mg/dL   LDL Cholesterol 69 0 - 99 mg/dL   Total CHOL/HDL Ratio 3    NonHDL 95.72    Assessment & Plan:   Problem List Items Addressed This Visit       Other   Class 1 obesity due to excess calories with body mass index (BMI) of 30.0 to 30.9 in adult - Primary    Agreed to resume GLP-1 agonist treatment for weight loss. She prefers Zepbound, GLP-1 and GIP combination treatment.  Start with 2.5 mg weekly x 4 weeks, then titrate upward monthly thereafter.  She will notify when she has completed her last 2.5 mg dose.  Continue with regular activity and a healthy diet. Follow-up in 3 months.        Relevant Medications   tirzepatide (ZEPBOUND) 2.5 MG/0.5ML injection vial     Meds ordered this encounter  Medications   tirzepatide (ZEPBOUND) 2.5 MG/0.5ML injection vial    Sig: Inject 2.5 mg into the skin once a week.    Dispense:  2 mL    Refill:  0    Order Specific Question:   Supervising Provider    Answer:   BEDSOLE, AMY E [[id number]]   No orders of the defined types were placed in this encounter.   I discussed the assessment and treatment plan with the patient. The patient was provided an opportunity to ask questions and all were answered. The patient agreed with the plan and demonstrated an understanding of the instructions. The patient was advised to call back or seek an in-person evaluation if the symptoms worsen or if the  condition fails to improve as anticipated.  Follow up plan:  Start tirzepitide (Zepbound) for weight loss. Start by injecting 2.5 mg into the skin once weekly for 4 weeks, then increase to 5 mg once weekly thereafter. Please notify me once you've used your last 2.5 mg pen so that I can prescribe the next dose.   Schedule a follow-up visit for 3 months.  It was a pleasure to see you today!   Doreene Nest, NP

## 2023-01-27 NOTE — Assessment & Plan Note (Signed)
Agreed to resume GLP-1 agonist treatment for weight loss. She prefers Zepbound, GLP-1 and GIP combination treatment.  Start with 2.5 mg weekly x 4 weeks, then titrate upward monthly thereafter.  She will notify when she has completed her last 2.5 mg dose.  Continue with regular activity and a healthy diet. Follow-up in 3 months.

## 2023-01-28 ENCOUNTER — Other Ambulatory Visit: Payer: Self-pay

## 2023-01-28 ENCOUNTER — Encounter: Payer: Self-pay | Admitting: Internal Medicine

## 2023-01-28 ENCOUNTER — Other Ambulatory Visit (HOSPITAL_COMMUNITY): Payer: Self-pay

## 2023-01-28 MED ORDER — ZEPBOUND 2.5 MG/0.5ML ~~LOC~~ SOAJ
2.5000 mg | SUBCUTANEOUS | 0 refills | Status: DC
  Filled 2023-01-28: qty 2, 28d supply, fill #0

## 2023-02-02 ENCOUNTER — Other Ambulatory Visit: Payer: Self-pay

## 2023-02-14 DIAGNOSIS — E66811 Obesity, class 1: Secondary | ICD-10-CM

## 2023-02-15 MED ORDER — TIRZEPATIDE 5 MG/0.5ML ~~LOC~~ SOAJ
5.0000 mg | SUBCUTANEOUS | 0 refills | Status: DC
Start: 2023-02-15 — End: 2023-02-19
  Filled 2023-02-15 – 2023-02-16 (×2): qty 2, 28d supply, fill #0

## 2023-02-16 ENCOUNTER — Other Ambulatory Visit: Payer: Self-pay

## 2023-02-16 ENCOUNTER — Encounter: Payer: Self-pay | Admitting: Internal Medicine

## 2023-02-16 ENCOUNTER — Other Ambulatory Visit (HOSPITAL_COMMUNITY): Payer: Self-pay

## 2023-02-19 ENCOUNTER — Other Ambulatory Visit (HOSPITAL_COMMUNITY): Payer: Self-pay

## 2023-02-19 ENCOUNTER — Other Ambulatory Visit: Payer: Self-pay

## 2023-02-19 MED ORDER — ZEPBOUND 5 MG/0.5ML ~~LOC~~ SOAJ
5.0000 mg | SUBCUTANEOUS | 0 refills | Status: DC
  Filled 2023-02-19: qty 2, 28d supply, fill #0

## 2023-02-19 NOTE — Telephone Encounter (Signed)
Will you tell the PA team not to complete a Zepbound PA. Patient is paying out of pocket

## 2023-02-24 ENCOUNTER — Other Ambulatory Visit: Payer: Self-pay

## 2023-02-24 ENCOUNTER — Ambulatory Visit: Payer: 59

## 2023-02-24 ENCOUNTER — Other Ambulatory Visit: Payer: 59

## 2023-02-24 ENCOUNTER — Ambulatory Visit: Payer: 59 | Admitting: Internal Medicine

## 2023-02-24 DIAGNOSIS — D509 Iron deficiency anemia, unspecified: Secondary | ICD-10-CM

## 2023-02-25 ENCOUNTER — Inpatient Hospital Stay: Payer: 59 | Admitting: Nurse Practitioner

## 2023-02-25 ENCOUNTER — Encounter: Payer: Self-pay | Admitting: Nurse Practitioner

## 2023-02-25 ENCOUNTER — Inpatient Hospital Stay: Payer: 59

## 2023-02-25 ENCOUNTER — Inpatient Hospital Stay: Payer: 59 | Attending: Internal Medicine

## 2023-02-25 VITALS — BP 131/83 | HR 76

## 2023-02-25 VITALS — BP 106/53 | HR 84 | Temp 98.6°F | Ht 68.0 in | Wt 201.0 lb

## 2023-02-25 DIAGNOSIS — D509 Iron deficiency anemia, unspecified: Secondary | ICD-10-CM

## 2023-02-25 DIAGNOSIS — N920 Excessive and frequent menstruation with regular cycle: Secondary | ICD-10-CM | POA: Insufficient documentation

## 2023-02-25 DIAGNOSIS — D500 Iron deficiency anemia secondary to blood loss (chronic): Secondary | ICD-10-CM

## 2023-02-25 DIAGNOSIS — D649 Anemia, unspecified: Secondary | ICD-10-CM

## 2023-02-25 LAB — CBC WITH DIFFERENTIAL (CANCER CENTER ONLY)
Abs Immature Granulocytes: 0.01 10*3/uL (ref 0.00–0.07)
Basophils Absolute: 0 10*3/uL (ref 0.0–0.1)
Basophils Relative: 0 %
Eosinophils Absolute: 0.1 10*3/uL (ref 0.0–0.5)
Eosinophils Relative: 3 %
HCT: 36.2 % (ref 36.0–46.0)
Hemoglobin: 12.4 g/dL (ref 12.0–15.0)
Immature Granulocytes: 0 %
Lymphocytes Relative: 35 %
Lymphs Abs: 1.9 10*3/uL (ref 0.7–4.0)
MCH: 30.9 pg (ref 26.0–34.0)
MCHC: 34.3 g/dL (ref 30.0–36.0)
MCV: 90.3 fL (ref 80.0–100.0)
Monocytes Absolute: 0.4 10*3/uL (ref 0.1–1.0)
Monocytes Relative: 7 %
Neutro Abs: 3 10*3/uL (ref 1.7–7.7)
Neutrophils Relative %: 55 %
Platelet Count: 192 10*3/uL (ref 150–400)
RBC: 4.01 MIL/uL (ref 3.87–5.11)
RDW: 13.5 % (ref 11.5–15.5)
WBC Count: 5.4 10*3/uL (ref 4.0–10.5)
nRBC: 0 % (ref 0.0–0.2)

## 2023-02-25 LAB — IRON AND TIBC
Iron: 116 ug/dL (ref 28–170)
Saturation Ratios: 32 % — ABNORMAL HIGH (ref 10.4–31.8)
TIBC: 361 ug/dL (ref 250–450)
UIBC: 245 ug/dL

## 2023-02-25 LAB — FERRITIN: Ferritin: 59 ng/mL (ref 11–307)

## 2023-02-25 MED ORDER — IRON SUCROSE 20 MG/ML IV SOLN
200.0000 mg | Freq: Once | INTRAVENOUS | Status: AC
Start: 2023-02-25 — End: 2023-02-25
  Administered 2023-02-25: 200 mg via INTRAVENOUS
  Filled 2023-02-25: qty 10

## 2023-02-25 NOTE — Patient Instructions (Signed)
Iron Sucrose Injection What is this medication? IRON SUCROSE (EYE ern SOO krose) treats low levels of iron (iron deficiency anemia) in people with kidney disease. Iron is a mineral that plays an important role in making red blood cells, which carry oxygen from your lungs to the rest of your body. This medicine may be used for other purposes; ask your health care provider or pharmacist if you have questions. COMMON BRAND NAME(S): Venofer What should I tell my care team before I take this medication? They need to know if you have any of these conditions: Anemia not caused by low iron levels Heart disease High levels of iron in the blood Kidney disease Liver disease An unusual or allergic reaction to iron, other medications, foods, dyes, or preservatives Pregnant or trying to get pregnant Breastfeeding How should I use this medication? This medication is for infusion into a vein. It is given in a hospital or clinic setting. Talk to your care team about the use of this medication in children. While this medication may be prescribed for children as young as 2 years for selected conditions, precautions do apply. Overdosage: If you think you have taken too much of this medicine contact a poison control center or emergency room at once. NOTE: This medicine is only for you. Do not share this medicine with others. What if I miss a dose? Keep appointments for follow-up doses. It is important not to miss your dose. Call your care team if you are unable to keep an appointment. What may interact with this medication? Do not take this medication with any of the following: Deferoxamine Dimercaprol Other iron products This medication may also interact with the following: Chloramphenicol Deferasirox This list may not describe all possible interactions. Give your health care provider a list of all the medicines, herbs, non-prescription drugs, or dietary supplements you use. Also tell them if you smoke,  drink alcohol, or use illegal drugs. Some items may interact with your medicine. What should I watch for while using this medication? Visit your care team regularly. Tell your care team if your symptoms do not start to get better or if they get worse. You may need blood work done while you are taking this medication. You may need to follow a special diet. Talk to your care team. Foods that contain iron include: whole grains/cereals, dried fruits, beans, or peas, leafy green vegetables, and organ meats (liver, kidney). What side effects may I notice from receiving this medication? Side effects that you should report to your care team as soon as possible: Allergic reactions--skin rash, itching, hives, swelling of the face, lips, tongue, or throat Low blood pressure--dizziness, feeling faint or lightheaded, blurry vision Shortness of breath Side effects that usually do not require medical attention (report to your care team if they continue or are bothersome): Flushing Headache Joint pain Muscle pain Nausea Pain, redness, or irritation at injection site This list may not describe all possible side effects. Call your doctor for medical advice about side effects. You may report side effects to FDA at 1-800-FDA-1088. Where should I keep my medication? This medication is given in a hospital or clinic. It will not be stored at home. NOTE: This sheet is a summary. It may not cover all possible information. If you have questions about this medicine, talk to your doctor, pharmacist, or health care provider.  2024 Elsevier/Gold Standard (2022-09-11 00:00:00)

## 2023-02-25 NOTE — Progress Notes (Signed)
[facility] CONSULT NOTE  Patient Care Team: Doreene Nest, NP as PCP - General (Internal Medicine) Conard Novak, MD as Attending Physician (Obstetrics and Gynecology) Langston Reusing, MD as Consulting Physician (Family Medicine) Earna Coder, MD as Consulting Physician (Oncology)  CHIEF COMPLAINTS/PURPOSE OF CONSULTATION: ANEMIA  HEMATOLOGY HISTORY:   # Chronic iron deficient anemia JAN 2024-ferritin 6; EGD/colonoscopy/capsule 2023- NEG  # Heavy menstrual cycles  # Liver disease: fatty liver.  Hx of ERCP   Latest Reference Range & Units 05/14/22 10:14  Iron 27 - 159 ug/dL 606  UIBC 301 - 601 ug/dL 093  TIBC 235 - 573 ug/dL 220  Ferritin 15 - 254 ng/mL 7 (L)  Iron Saturation 15 - 55 % 29  (L): Data is abnormally low   Latest Reference Range & Units 05/14/22 10:14  Hemoglobin 11.1 - 15.9 g/dL 9.0 (L)  HCT 27.0 - 62.3 % 28.1 (L)  MCV 79 - 97 fL 87    HISTORY OF PRESENTING ILLNESS:  Angela [name] 47 y.o. female pleasant patient with iron deficiency anemia likely secondary to menorrhagia is here for follow-up. She tolerated iron infusions well. Continues to have heavy menstrual periods. Symptomatically improving.   Review of Systems  Constitutional:  Positive for malaise/fatigue. Negative for chills, diaphoresis, fever and weight loss.  HENT:  Negative for nosebleeds and sore throat.   Eyes:  Negative for double vision.  Respiratory:  Negative for cough, hemoptysis, sputum production, shortness of breath and wheezing.   Cardiovascular:  Negative for chest pain, palpitations, orthopnea and leg swelling.  Gastrointestinal:  Negative for abdominal pain, blood in stool, constipation, diarrhea, heartburn, melena, nausea and vomiting.  Genitourinary:  Negative for dysuria, frequency and urgency.  Musculoskeletal:  Negative for back pain and joint pain.  Skin: Negative.  Negative for itching and rash.  Neurological:  Negative for  dizziness, tingling, focal weakness, weakness and headaches.  Endo/Heme/Allergies:  Does not bruise/bleed easily.  Psychiatric/Behavioral:  Negative for depression. The patient is not nervous/anxious and does not have insomnia.     MEDICAL HISTORY:  Past Medical History:  Diagnosis Date   Allergy Enviromental   Anemia 08/2015   Asthma    as a child   Back pain gerd   Constipation    Dysuria 11/25/2018   Glaucoma    Heart murmur childhood   IBS (irritable bowel syndrome)    Joint pain    Menorrhagia    Muscle spasm 07/04/2018   Palpitations    Plantar fasciitis    Prediabetes    Swelling    feet, legs   Torn meniscus    Right   Vaginal burning 11/25/2018   Voice hoarseness 07/04/2018    SURGICAL HISTORY: Past Surgical History:  Procedure Laterality Date   CHOLECYSTECTOMY  2005   COLONOSCOPY WITH PROPOFOL N/A 10/27/2018   Procedure: COLONOSCOPY WITH PROPOFOL;  Surgeon: Toney Reil, MD;  Location: [facility] ENDOSCOPY;  Service: Gastroenterology;  Laterality: N/A;   COLONOSCOPY WITH PROPOFOL N/A 10/31/2021   Procedure: COLONOSCOPY WITH PROPOFOL;  Surgeon: Toney Reil, MD;  Location: [facility] ENDOSCOPY;  Service: Gastroenterology;  Laterality: N/A;   DILATATION & CURETTAGE/HYSTEROSCOPY WITH MYOSURE N/A 06/23/2018   Procedure: DILATATION & CURETTAGE/HYSTEROSCOPY WITH MYOSURE;  Surgeon: Conard Novak, MD;  Location: [facility] ORS;  Service: Gynecology;  Laterality: N/A;   DILATION AND CURETTAGE OF UTERUS     ERCP     3 times   ESOPHAGOGASTRODUODENOSCOPY (EGD) WITH PROPOFOL  10/27/2018  Procedure: ESOPHAGOGASTRODUODENOSCOPY (EGD) WITH PROPOFOL;  Surgeon: Toney Reil, MD;  Location: [facility] ENDOSCOPY;  Service: Gastroenterology;;   EYE SURGERY     GIVENS CAPSULE STUDY N/A 12/11/2019   Procedure: GIVENS CAPSULE STUDY;  Surgeon: Toney Reil, MD;  Location: [facility] ENDOSCOPY;  Service: Gastroenterology;  Laterality: N/A;   MENISCUS REPAIR Right    x 2    SOCIAL  HISTORY: Social History   Socioeconomic History   Marital status: Married    Spouse name: Emergency planning/management officer   Number of children: 0   Years of education: Not on file   Highest education level: Not on file  Occupational History   Occupation: Buyer, retail: [employer]  Tobacco Use   Smoking status: Never   Smokeless tobacco: Never  Vaping Use   Vaping status: Never Used  Substance and Sexual Activity   Alcohol use: Yes    Alcohol/week: 2.0 standard drinks of alcohol    Types: 2 Glasses of wine per week    Comment: occasional   Drug use: No   Sexual activity: Yes    Birth control/protection: None  Other Topics Concern   Not on file  Social History Narrative   Married.   No children.   Works in the [employer].   Enjoys watching movies, walking, reading.    Social Determinants of Health   Financial Resource Strain: Not on file  Food Insecurity: No Food Insecurity (06/16/2022)   Hunger Vital Sign    Worried About Running Out of Food in the Last Year: Never true    Ran Out of Food in the Last Year: Never true  Transportation Needs: No Transportation Needs (06/16/2022)   PRAPARE - Administrator, Civil Service (Medical): No    Lack of Transportation (Non-Medical): No  Physical Activity: Unknown (04/29/2018)   Exercise Vital Sign    Days of Exercise per Week: 0 days    Minutes of Exercise per Session: Not on file  Stress: Not on file  Social Connections: Not on file  Intimate Partner Violence: Not At Risk (06/16/2022)   Humiliation, Afraid, Rape, and Kick questionnaire    Fear of Current or Ex-Partner: No    Emotionally Abused: No    Physically Abused: No    Sexually Abused: No    FAMILY HISTORY: Family History  Problem Relation Age of Onset   Colon cancer Mother 33   Diabetes Mother    Hypertension Mother    Arthritis Mother    Transient ischemic attack Mother    Liver disease Mother    Obesity Mother    Diabetes Father     Heart disease Father    Breast cancer Maternal Aunt        mat great aunt   Esophageal cancer Maternal Grandmother    Cancer Maternal Grandmother    Diabetes Maternal Grandmother    Alcohol abuse Maternal Grandfather     ALLERGIES:  is allergic to quinoa-kale-hemp [alimentum].  MEDICATIONS:  Current Outpatient Medications  Medication Sig Dispense Refill   atropine 1 % ophthalmic solution Place 1 drop into the right eye daily. 5 mL 11   brimonidine-timolol (COMBIGAN) 0.2-0.5 % ophthalmic solution Place 1 drop into the right eye every 12 (twelve) hours. 10 mL 11   cetirizine (ZYRTEC) 10 MG tablet Take 10 mg by mouth daily as needed for allergies.     Cholecalciferol (VITAMIN D3) 50 MCG (2000 UT) capsule Take 1 capsule (2,000 Units total) by  mouth daily.     Fiber Adult Gummies 2 g CHEW      linaclotide (LINZESS) 145 MCG CAPS capsule Take 1 capsule (145 mcg total) by mouth daily before breakfast. 90 capsule 3   metFORMIN (GLUCOPHAGE) 500 MG tablet Take 1 tablet (500 mg total) by mouth 2 (two) times daily with a meal. 180 tablet 0   tirzepatide (ZEPBOUND) 5 MG/0.5ML Pen Inject 5 mg into the skin once a week. 2 mL 0   Iron-FA-B Cmp-C-Biot-Probiotic (FUSION PLUS) CAPS Take 1 capsule by mouth daily. (Patient not taking: Reported on 01/27/2023) 90 capsule 0   meloxicam (MOBIC) 15 MG tablet Take 1 tablet (15 mg total) by mouth daily with a meal. (Patient not taking: Reported on 01/27/2023) 90 tablet 2   meloxicam (MOBIC) 7.5 MG tablet Take 1 tablet (7.5 mg total) by mouth 2 (two) times daily with food. (Patient not taking: Reported on 01/27/2023) 28 tablet 0   No current facility-administered medications for this visit.    PHYSICAL EXAMINATION: Vitals:   02/25/23 1429  BP: (!) 106/53  Pulse: 84  Temp: 98.6 F (37 C)  SpO2: 99%   Filed Weights   02/25/23 1429  Weight: 201 lb (91.2 kg)   Physical Exam Vitals reviewed.  Constitutional:      Appearance: She is not ill-appearing.   Cardiovascular:     Rate and Rhythm: Normal rate and regular rhythm.  Pulmonary:     Effort: No respiratory distress.     Breath sounds: No wheezing.  Abdominal:     General: There is no distension.     Tenderness: There is no guarding.  Skin:    General: Skin is warm.     Coloration: Skin is not pale.  Neurological:     Mental Status: She is alert and oriented to person, place, and time.  Psychiatric:        Mood and Affect: Mood normal.        Behavior: Behavior normal.     LABORATORY DATA:  I have reviewed the data as listed Lab Results  Component Value Date   WBC 5.4 02/25/2023   HGB 12.4 02/25/2023   HCT 36.2 02/25/2023   MCV 90.3 02/25/2023   PLT 192 02/25/2023   Iron/TIBC/Ferritin/ %Sat    Component Value Date/Time   IRON 28 11/24/2022 1455   IRON 116 05/14/2022 1014   TIBC 438 11/24/2022 1455   TIBC 398 05/14/2022 1014   FERRITIN 7 (L) 11/24/2022 1455   FERRITIN 7 (L) 05/14/2022 1014   IRONPCTSAT 6 (L) 11/24/2022 1455   IRONPCTSAT 29 05/14/2022 1014   IRONPCTSAT 7 (L) 09/08/2016 1109   No results found.  ASSESSMENT & PLAN:   # Symptomatic anemia d/t iron deficiency- secondary to menorrhagia. On oral iron biglycinate. Ongoing iron deficency, now s/p venofer. Tolerated well. Last 12/25/22. Hmg 12.4. Iron studies epnding. Proceed with venofer today. Additional doses based on iron studies. Symptomatically improved.    # Etiology of iron deficiency: Likely secondary to menorrhagia [Dr.jackson].  EGD colonoscopy capsule- ? 2023- NEG.   DISPOSITION: Venofer today 4 mo - lab Day to week later- see Dr. Donneta Romberg +/- venofer- la  No problem-specific Assessment & Plan notes found for this encounter.  All questions were answered. The patient knows to call the clinic with any problems, questions or concerns.  Alinda Dooms, NP 02/25/2023

## 2023-03-09 ENCOUNTER — Other Ambulatory Visit (HOSPITAL_COMMUNITY): Payer: Self-pay

## 2023-03-19 ENCOUNTER — Other Ambulatory Visit: Payer: Self-pay

## 2023-03-24 ENCOUNTER — Encounter: Payer: Self-pay | Admitting: Primary Care

## 2023-03-24 ENCOUNTER — Other Ambulatory Visit: Payer: Self-pay

## 2023-03-24 ENCOUNTER — Ambulatory Visit (INDEPENDENT_AMBULATORY_CARE_PROVIDER_SITE_OTHER): Payer: 59 | Admitting: Primary Care

## 2023-03-24 VITALS — BP 136/88 | HR 85 | Temp 97.5°F | Ht 68.0 in | Wt 197.0 lb

## 2023-03-24 DIAGNOSIS — Z6829 Body mass index (BMI) 29.0-29.9, adult: Secondary | ICD-10-CM

## 2023-03-24 DIAGNOSIS — E663 Overweight: Secondary | ICD-10-CM

## 2023-03-24 DIAGNOSIS — R7303 Prediabetes: Secondary | ICD-10-CM | POA: Diagnosis not present

## 2023-03-24 MED ORDER — TIRZEPATIDE-WEIGHT MANAGEMENT 7.5 MG/0.5ML ~~LOC~~ SOAJ
7.5000 mg | SUBCUTANEOUS | 0 refills | Status: DC
  Filled 2023-03-24: qty 2, 28d supply, fill #0

## 2023-03-24 NOTE — Patient Instructions (Signed)
We increased your dose of Zepbound to 7.5 mg once weekly. Please update me in 1 month via MyChart.  Please schedule a follow up visit for 3 months.  It was a pleasure to see you today!

## 2023-03-24 NOTE — Progress Notes (Signed)
Subjective:    Patient ID: Angela [name], female    DOB: 1975/12/23, 47 y.o.   MRN: [id number]  HPI  Angela [name] is a very pleasant 47 y.o. female with a history of chronic constipation, iron deficiency anemia, prediabetes, glaucoma, class I obesity who presents today for follow-up of obesity.  Currently managed on Tresiba tied 5 mg once weekly for weight management.  This was reinitiated in October 2024 after regain of weight from discontinuation of GLP-1 agonist treatment previously [facility]).  Her Reginal Lutes was discontinued due to lack of insurance coverage/cost.  Zepbound was more affordable.  Since initiation of Zepbound she's doing well. She does experience hunger in between meals. She denies constipation, heartburn, nausea. She was on vacation during mid to late November 2024 which halted some progress.   She continues to follow a healthier diet that was prescribed by healthy weight and wellness. She is eating about 140 gm of protein daily through various sources. She is mostly drinking flavored water, water, coffee, tea. She is cooking mostly at home. She no longer follows with healthy weight and wellness.   Body mass index is 29.95 kg/m.   BP Readings from Last 3 Encounters:  03/24/23 136/88  02/25/23 131/83  02/25/23 (!) 106/53    Wt Readings from Last 3 Encounters:  03/24/23 197 lb (89.4 kg)  02/25/23 201 lb (91.2 kg)  12/02/22 199 lb 12.8 oz (90.6 kg)     Review of Systems  Respiratory:  Negative for shortness of breath.   Cardiovascular:  Negative for chest pain.  Gastrointestinal:  Negative for constipation, diarrhea and nausea.  Neurological:  Negative for headaches.         Past Medical History:  Diagnosis Date   Allergy Enviromental   Anemia 08/2015   Asthma    as a child   Back pain gerd   Constipation    Dysuria 11/25/2018   Glaucoma    Heart murmur childhood   IBS (irritable bowel syndrome)    Joint pain    Menorrhagia     Muscle spasm 07/04/2018   Palpitations    Plantar fasciitis    Prediabetes    Swelling    feet, legs   Torn meniscus    Right   Vaginal burning 11/25/2018   Voice hoarseness 07/04/2018    Social History   Socioeconomic History   Marital status: Married    Spouse name: Emergency planning/management officer   Number of children: 0   Years of education: Not on file   Highest education level: Not on file  Occupational History   Occupation: Buyer, retail: [employer]  Tobacco Use   Smoking status: Never   Smokeless tobacco: Never  Vaping Use   Vaping status: Never Used  Substance and Sexual Activity   Alcohol use: Yes    Alcohol/week: 2.0 standard drinks of alcohol    Types: 2 Glasses of wine per week    Comment: occasional   Drug use: No   Sexual activity: Yes    Birth control/protection: None  Other Topics Concern   Not on file  Social History Narrative   Married.   No children.   Works in the [employer].   Enjoys watching movies, walking, reading.    Social Determinants of Health   Financial Resource Strain: Not on file  Food Insecurity: No Food Insecurity (06/16/2022)   Hunger Vital Sign    Worried About Running Out of Food in the Last Year: Never  true    Ran Out of Food in the Last Year: Never true  Transportation Needs: No Transportation Needs (06/16/2022)   PRAPARE - Administrator, Civil Service (Medical): No    Lack of Transportation (Non-Medical): No  Physical Activity: Unknown (04/29/2018)   Exercise Vital Sign    Days of Exercise per Week: 0 days    Minutes of Exercise per Session: Not on file  Stress: Not on file  Social Connections: Not on file  Intimate Partner Violence: Not At Risk (06/16/2022)   Humiliation, Afraid, Rape, and Kick questionnaire    Fear of Current or Ex-Partner: No    Emotionally Abused: No    Physically Abused: No    Sexually Abused: No    Past Surgical History:  Procedure Laterality Date   CHOLECYSTECTOMY   2005   COLONOSCOPY WITH PROPOFOL N/A 10/27/2018   Procedure: COLONOSCOPY WITH PROPOFOL;  Surgeon: Toney Reil, MD;  Location: [facility] ENDOSCOPY;  Service: Gastroenterology;  Laterality: N/A;   COLONOSCOPY WITH PROPOFOL N/A 10/31/2021   Procedure: COLONOSCOPY WITH PROPOFOL;  Surgeon: Toney Reil, MD;  Location: [facility] ENDOSCOPY;  Service: Gastroenterology;  Laterality: N/A;   DILATATION & CURETTAGE/HYSTEROSCOPY WITH MYOSURE N/A 06/23/2018   Procedure: DILATATION & CURETTAGE/HYSTEROSCOPY WITH MYOSURE;  Surgeon: Conard Novak, MD;  Location: [facility] ORS;  Service: Gynecology;  Laterality: N/A;   DILATION AND CURETTAGE OF UTERUS     ERCP     3 times   ESOPHAGOGASTRODUODENOSCOPY (EGD) WITH PROPOFOL  10/27/2018   Procedure: ESOPHAGOGASTRODUODENOSCOPY (EGD) WITH PROPOFOL;  Surgeon: Toney Reil, MD;  Location: [facility] ENDOSCOPY;  Service: Gastroenterology;;   EYE SURGERY     GIVENS CAPSULE STUDY N/A 12/11/2019   Procedure: GIVENS CAPSULE STUDY;  Surgeon: Toney Reil, MD;  Location: [facility] ENDOSCOPY;  Service: Gastroenterology;  Laterality: N/A;   MENISCUS REPAIR Right    x 2    Family History  Problem Relation Age of Onset   Colon cancer Mother 53   Diabetes Mother    Hypertension Mother    Arthritis Mother    Transient ischemic attack Mother    Liver disease Mother    Obesity Mother    Diabetes Father    Heart disease Father    Breast cancer Maternal Aunt        mat great aunt   Esophageal cancer Maternal Grandmother    Cancer Maternal Grandmother    Diabetes Maternal Grandmother    Alcohol abuse Maternal Grandfather     Allergies  Allergen Reactions   Quinoa-Kale-Hemp [Alimentum]     Current Outpatient Medications on File Prior to Visit  Medication Sig Dispense Refill   atropine 1 % ophthalmic solution Place 1 drop into the right eye daily. 5 mL 11   brimonidine-timolol (COMBIGAN) 0.2-0.5 % ophthalmic solution Place 1 drop into the right eye every 12 (twelve)  hours. 10 mL 11   cetirizine (ZYRTEC) 10 MG tablet Take 10 mg by mouth daily as needed for allergies.     Cholecalciferol (VITAMIN D3) 50 MCG (2000 UT) capsule Take 1 capsule (2,000 Units total) by mouth daily.     Fiber Adult Gummies 2 g CHEW      linaclotide (LINZESS) 145 MCG CAPS capsule Take 1 capsule (145 mcg total) by mouth daily before breakfast. 90 capsule 3   No current facility-administered medications on file prior to visit.    BP 136/88   Pulse 85   Temp (!) 97.5 F (36.4 C) (Temporal)  Ht 5\' 8"  (1.727 m)   Wt 197 lb (89.4 kg)   SpO2 98%   BMI 29.95 kg/m  Objective:   Physical Exam Cardiovascular:     Rate and Rhythm: Normal rate and regular rhythm.  Pulmonary:     Effort: Pulmonary effort is normal.     Breath sounds: Normal breath sounds.  Musculoskeletal:     Cervical back: Neck supple.  Skin:    General: Skin is warm and dry.  Neurological:     Mental Status: She is alert and oriented to person, place, and time.  Psychiatric:        Mood and Affect: Mood normal.           Assessment & Plan:  Overweight with body mass index (BMI) of 29 to 29.9 in adult Assessment & Plan: Improving.  Discussed to work on cravings.  Continue to work on a healthy diet.   Increase Zepbound to 7.5 mg weekly x 4 weeks, then titrate upward thereafter. She will update.  Follow up in 3 months.  Orders: -     Tirzepatide-Weight Management; Inject 7.5 mg into the skin once a week.  Dispense: 2 mL; Refill: 0  Prediabetes -     Tirzepatide-Weight Management; Inject 7.5 mg into the skin once a week.  Dispense: 2 mL; Refill: 0        Doreene Nest, NP

## 2023-03-24 NOTE — Assessment & Plan Note (Signed)
Improving.  Discussed to work on cravings.  Continue to work on a healthy diet.   Increase Zepbound to 7.5 mg weekly x 4 weeks, then titrate upward thereafter. She will update.  Follow up in 3 months.

## 2023-04-15 ENCOUNTER — Other Ambulatory Visit: Payer: Self-pay

## 2023-04-15 DIAGNOSIS — Z9189 Other specified personal risk factors, not elsewhere classified: Secondary | ICD-10-CM

## 2023-04-15 DIAGNOSIS — E66811 Obesity, class 1: Secondary | ICD-10-CM

## 2023-04-15 DIAGNOSIS — R7303 Prediabetes: Secondary | ICD-10-CM

## 2023-04-15 MED ORDER — TIRZEPATIDE-WEIGHT MANAGEMENT 10 MG/0.5ML ~~LOC~~ SOAJ
10.0000 mg | SUBCUTANEOUS | 0 refills | Status: DC
  Filled 2023-04-15: qty 2, 28d supply, fill #0

## 2023-05-17 DIAGNOSIS — R7303 Prediabetes: Secondary | ICD-10-CM

## 2023-05-17 DIAGNOSIS — Z6830 Body mass index (BMI) 30.0-30.9, adult: Secondary | ICD-10-CM

## 2023-05-17 MED ORDER — TIRZEPATIDE-WEIGHT MANAGEMENT 12.5 MG/0.5ML ~~LOC~~ SOAJ
12.5000 mg | SUBCUTANEOUS | 0 refills | Status: DC
  Filled 2023-05-17: qty 2, 28d supply, fill #0

## 2023-05-18 ENCOUNTER — Other Ambulatory Visit: Payer: Self-pay

## 2023-05-25 ENCOUNTER — Other Ambulatory Visit: Payer: Self-pay

## 2023-05-26 ENCOUNTER — Other Ambulatory Visit: Payer: Self-pay

## 2023-05-26 ENCOUNTER — Encounter: Payer: Self-pay | Admitting: Internal Medicine

## 2023-05-31 ENCOUNTER — Other Ambulatory Visit: Payer: Self-pay

## 2023-05-31 ENCOUNTER — Encounter: Payer: Self-pay | Admitting: Internal Medicine

## 2023-05-31 MED ORDER — BRIMONIDINE TARTRATE-TIMOLOL 0.2-0.5 % OP SOLN
1.0000 [drp] | Freq: Two times a day (BID) | OPHTHALMIC | 11 refills | Status: DC
  Filled 2023-05-31: qty 10, 90d supply, fill #0
  Filled 2023-08-30 – 2023-08-31 (×2): qty 10, 90d supply, fill #1

## 2023-06-03 ENCOUNTER — Other Ambulatory Visit: Payer: Self-pay

## 2023-06-17 ENCOUNTER — Other Ambulatory Visit (HOSPITAL_BASED_OUTPATIENT_CLINIC_OR_DEPARTMENT_OTHER): Payer: Self-pay

## 2023-06-17 ENCOUNTER — Other Ambulatory Visit: Payer: Self-pay

## 2023-06-17 ENCOUNTER — Other Ambulatory Visit (HOSPITAL_COMMUNITY): Payer: Self-pay

## 2023-06-17 ENCOUNTER — Ambulatory Visit (INDEPENDENT_AMBULATORY_CARE_PROVIDER_SITE_OTHER): Payer: 59 | Admitting: Primary Care

## 2023-06-17 VITALS — BP 106/70 | HR 94 | Temp 97.3°F | Ht 68.0 in | Wt 194.0 lb

## 2023-06-17 DIAGNOSIS — D649 Anemia, unspecified: Secondary | ICD-10-CM

## 2023-06-17 DIAGNOSIS — E663 Overweight: Secondary | ICD-10-CM | POA: Diagnosis not present

## 2023-06-17 DIAGNOSIS — Z6829 Body mass index (BMI) 29.0-29.9, adult: Secondary | ICD-10-CM | POA: Diagnosis not present

## 2023-06-17 MED ORDER — TIRZEPATIDE-WEIGHT MANAGEMENT 15 MG/0.5ML ~~LOC~~ SOAJ
15.0000 mg | SUBCUTANEOUS | 0 refills | Status: DC
  Filled 2023-06-17: qty 2, 28d supply, fill #0

## 2023-06-17 NOTE — Patient Instructions (Signed)
 We increased your Zepbound to 15 mg weekly.  Please schedule a physical to meet with me in 6 months.   It was a pleasure to see you today!

## 2023-06-17 NOTE — Progress Notes (Signed)
 Subjective:    Patient ID: Angela [name], female    DOB: 01-13-1976, 48 y.o.   MRN: [id number]  HPI  Angela [name] is a very pleasant 48 y.o. female with a history of chronic constipation, iron deficiency anemia, prediabetes, menorrhagia with regular cycle, obesity who presents today for follow-up of obesity.  Currently managed on tirzepatide (Zepbound) 12.5 mg weekly for obesity for which she started in October 2024.   Today she's doing well. She denies nausea, GERD, constipation, diarrhea. She did plateau at the 10 mg dose, and is seeing some weight loss now with the 12.5 mg dose.   She is consuming 100 g of protein daily. She is drinking mostly water, 2 cups of coffee daily. She has increased her fiber intake. She is walking, getting 10,000-12,000 steps daily. Plans to start resistance training.   Wt Readings from Last 3 Encounters:  06/17/23 194 lb (88 kg)  03/24/23 197 lb (89.4 kg)  02/25/23 201 lb (91.2 kg)   BP Readings from Last 3 Encounters:  06/17/23 106/70  03/24/23 136/88  02/25/23 131/83    Body mass index is 29.5 kg/m.     Review of Systems  Respiratory:  Negative for shortness of breath.   Cardiovascular:  Negative for chest pain.  Gastrointestinal:  Negative for abdominal pain, constipation, diarrhea and nausea.         Past Medical History:  Diagnosis Date   Allergy Enviromental   Anemia 08/2015   Asthma    as a child   Back pain gerd   Constipation    Dysuria 11/25/2018   Glaucoma    Heart murmur childhood   IBS (irritable bowel syndrome)    Joint pain    Menorrhagia    Muscle spasm 07/04/2018   Palpitations    Plantar fasciitis    Prediabetes    Swelling    feet, legs   Torn meniscus    Right   Vaginal burning 11/25/2018   Voice hoarseness 07/04/2018    Social History   Socioeconomic History   Marital status: Married    Spouse name: Emergency planning/management officer   Number of children: 0   Years of education: Not on file    Highest education level: Professional school degree (e.g., MD, DDS, DVM, JD)  Occupational History   Occupation: Clinical Pharmacist    Employer: [employer]  Tobacco Use   Smoking status: Never   Smokeless tobacco: Never  Vaping Use   Vaping status: Never Used  Substance and Sexual Activity   Alcohol use: Yes    Alcohol/week: 2.0 standard drinks of alcohol    Types: 2 Glasses of wine per week    Comment: occasional   Drug use: No   Sexual activity: Yes    Birth control/protection: None  Other Topics Concern   Not on file  Social History Narrative   Married.   No children.   Works in the [employer].   Enjoys watching movies, walking, reading.    Social Drivers of Corporate investment banker Strain: Low Risk  (06/17/2023)   Overall Financial Resource Strain (CARDIA)    Difficulty of Paying Living Expenses: Not hard at all  Food Insecurity: No Food Insecurity (06/17/2023)   Hunger Vital Sign    Worried About Running Out of Food in the Last Year: Never true    Ran Out of Food in the Last Year: Never true  Transportation Needs: No Transportation Needs (06/17/2023)   PRAPARE - Transportation  Lack of Transportation (Medical): No    Lack of Transportation (Non-Medical): No  Physical Activity: Insufficiently Active (06/17/2023)   Exercise Vital Sign    Days of Exercise per Week: 2 days    Minutes of Exercise per Session: 20 min  Stress: No Stress Concern Present (06/17/2023)   [employer] of Occupational Health - Occupational Stress Questionnaire    Feeling of Stress : Only a little  Social Connections: Unknown (06/17/2023)   Social Connection and Isolation Panel [NHANES]    Frequency of Communication with Friends and Family: Once a week    Frequency of Social Gatherings with Friends and Family: Patient declined    Attends Religious Services: More than 4 times per year    Active Member of Clubs or Organizations: Yes    Attends Banker Meetings: 1 to  4 times per year    Marital Status: Married  Catering manager Violence: Not At Risk (06/16/2022)   Humiliation, Afraid, Rape, and Kick questionnaire    Fear of Current or Ex-Partner: No    Emotionally Abused: No    Physically Abused: No    Sexually Abused: No    Past Surgical History:  Procedure Laterality Date   CHOLECYSTECTOMY  2005   COLONOSCOPY WITH PROPOFOL N/A 10/27/2018   Procedure: COLONOSCOPY WITH PROPOFOL;  Surgeon: Toney Reil, MD;  Location: [facility] ENDOSCOPY;  Service: Gastroenterology;  Laterality: N/A;   COLONOSCOPY WITH PROPOFOL N/A 10/31/2021   Procedure: COLONOSCOPY WITH PROPOFOL;  Surgeon: Toney Reil, MD;  Location: [facility] ENDOSCOPY;  Service: Gastroenterology;  Laterality: N/A;   DILATATION & CURETTAGE/HYSTEROSCOPY WITH MYOSURE N/A 06/23/2018   Procedure: DILATATION & CURETTAGE/HYSTEROSCOPY WITH MYOSURE;  Surgeon: Conard Novak, MD;  Location: [facility] ORS;  Service: Gynecology;  Laterality: N/A;   DILATION AND CURETTAGE OF UTERUS     ERCP     3 times   ESOPHAGOGASTRODUODENOSCOPY (EGD) WITH PROPOFOL  10/27/2018   Procedure: ESOPHAGOGASTRODUODENOSCOPY (EGD) WITH PROPOFOL;  Surgeon: Toney Reil, MD;  Location: [facility] ENDOSCOPY;  Service: Gastroenterology;;   EYE SURGERY     GIVENS CAPSULE STUDY N/A 12/11/2019   Procedure: GIVENS CAPSULE STUDY;  Surgeon: Toney Reil, MD;  Location: [facility] ENDOSCOPY;  Service: Gastroenterology;  Laterality: N/A;   MENISCUS REPAIR Right    x 2    Family History  Problem Relation Age of Onset   Colon cancer Mother 52   Diabetes Mother    Hypertension Mother    Arthritis Mother    Transient ischemic attack Mother    Liver disease Mother    Obesity Mother    Diabetes Father    Heart disease Father    Breast cancer Maternal Aunt        mat great aunt   Esophageal cancer Maternal Grandmother    Cancer Maternal Grandmother    Diabetes Maternal Grandmother    Alcohol abuse Maternal Grandfather     Allergies   Allergen Reactions   Quinoa-Kale-Hemp [Alimentum]     Current Outpatient Medications on File Prior to Visit  Medication Sig Dispense Refill   atropine 1 % ophthalmic solution Place 1 drop into the right eye daily. 5 mL 11   brimonidine-timolol (COMBIGAN) 0.2-0.5 % ophthalmic solution Place 1 drop into the right eye every 12 (twelve) hours. 10 mL 11   cetirizine (ZYRTEC) 10 MG tablet Take 10 mg by mouth daily as needed for allergies.     Cholecalciferol (VITAMIN D3) 50 MCG (2000 UT) capsule Take 1 capsule (2,000 Units  total) by mouth daily.     Fiber Adult Gummies 2 g CHEW      linaclotide (LINZESS) 145 MCG CAPS capsule Take 1 capsule (145 mcg total) by mouth daily before breakfast. 90 capsule 3   No current facility-administered medications on file prior to visit.    BP 106/70   Pulse 94   Temp (!) 97.3 F (36.3 C) (Temporal)   Ht 5\' 8"  (1.727 m)   Wt 194 lb (88 kg)   SpO2 96%   BMI 29.50 kg/m  Objective:   Physical Exam Cardiovascular:     Rate and Rhythm: Normal rate and regular rhythm.  Pulmonary:     Effort: Pulmonary effort is normal.     Breath sounds: Normal breath sounds.  Musculoskeletal:     Cervical back: Neck supple.  Skin:    General: Skin is warm and dry.  Neurological:     Mental Status: She is alert and oriented to person, place, and time.  Psychiatric:        Mood and Affect: Mood normal.           Assessment & Plan:  Overweight with body mass index (BMI) of 29 to 29.9 in adult Assessment & Plan: Commended her on weight loss!  Encouraged to continue with exercise, start resistance training. Increase Zepbound to 15 mg weekly.  Follow-up in 6 months. She will also update via MyChart.  Orders: -     Tirzepatide-Weight Management; Inject 15 mg into the skin once a week.  Dispense: 2 mL; Refill: 0        Doreene Nest, NP

## 2023-06-17 NOTE — Assessment & Plan Note (Signed)
 Commended her on weight loss!  Encouraged to continue with exercise, start resistance training. Increase Zepbound to 15 mg weekly.  Follow-up in 6 months. She will also update via MyChart.

## 2023-06-18 ENCOUNTER — Other Ambulatory Visit: Payer: 59

## 2023-06-18 ENCOUNTER — Inpatient Hospital Stay: Payer: 59 | Attending: Internal Medicine

## 2023-06-18 DIAGNOSIS — D649 Anemia, unspecified: Secondary | ICD-10-CM

## 2023-06-18 DIAGNOSIS — D509 Iron deficiency anemia, unspecified: Secondary | ICD-10-CM | POA: Insufficient documentation

## 2023-06-18 LAB — CBC WITH DIFFERENTIAL (CANCER CENTER ONLY)
Abs Immature Granulocytes: 0.02 10*3/uL (ref 0.00–0.07)
Basophils Absolute: 0 10*3/uL (ref 0.0–0.1)
Basophils Relative: 0 %
Eosinophils Absolute: 0.1 10*3/uL (ref 0.0–0.5)
Eosinophils Relative: 1 %
HCT: 39.3 % (ref 36.0–46.0)
Hemoglobin: 13.1 g/dL (ref 12.0–15.0)
Immature Granulocytes: 0 %
Lymphocytes Relative: 27 %
Lymphs Abs: 1.8 10*3/uL (ref 0.7–4.0)
MCH: 30.3 pg (ref 26.0–34.0)
MCHC: 33.3 g/dL (ref 30.0–36.0)
MCV: 91 fL (ref 80.0–100.0)
Monocytes Absolute: 0.5 10*3/uL (ref 0.1–1.0)
Monocytes Relative: 7 %
Neutro Abs: 4.3 10*3/uL (ref 1.7–7.7)
Neutrophils Relative %: 65 %
Platelet Count: 202 10*3/uL (ref 150–400)
RBC: 4.32 MIL/uL (ref 3.87–5.11)
RDW: 12.4 % (ref 11.5–15.5)
WBC Count: 6.8 10*3/uL (ref 4.0–10.5)
nRBC: 0 % (ref 0.0–0.2)

## 2023-06-18 LAB — FERRITIN: Ferritin: 31 ng/mL (ref 11–307)

## 2023-06-18 LAB — IRON AND TIBC
Iron: 90 ug/dL (ref 28–170)
Saturation Ratios: 24 % (ref 10.4–31.8)
TIBC: 370 ug/dL (ref 250–450)
UIBC: 280 ug/dL

## 2023-06-23 ENCOUNTER — Ambulatory Visit: Payer: 59 | Admitting: Primary Care

## 2023-06-25 ENCOUNTER — Other Ambulatory Visit: Payer: Self-pay

## 2023-06-25 ENCOUNTER — Inpatient Hospital Stay: Payer: 59

## 2023-06-25 ENCOUNTER — Encounter: Payer: Self-pay | Admitting: Internal Medicine

## 2023-06-25 ENCOUNTER — Inpatient Hospital Stay: Payer: 59 | Attending: Internal Medicine | Admitting: Internal Medicine

## 2023-06-25 VITALS — BP 115/72 | HR 86 | Temp 97.1°F | Resp 20 | Wt 195.2 lb

## 2023-06-25 DIAGNOSIS — J45909 Unspecified asthma, uncomplicated: Secondary | ICD-10-CM | POA: Diagnosis not present

## 2023-06-25 DIAGNOSIS — Z803 Family history of malignant neoplasm of breast: Secondary | ICD-10-CM | POA: Diagnosis not present

## 2023-06-25 DIAGNOSIS — K760 Fatty (change of) liver, not elsewhere classified: Secondary | ICD-10-CM | POA: Diagnosis not present

## 2023-06-25 DIAGNOSIS — D509 Iron deficiency anemia, unspecified: Secondary | ICD-10-CM | POA: Diagnosis not present

## 2023-06-25 DIAGNOSIS — N920 Excessive and frequent menstruation with regular cycle: Secondary | ICD-10-CM | POA: Insufficient documentation

## 2023-06-25 DIAGNOSIS — D649 Anemia, unspecified: Secondary | ICD-10-CM | POA: Diagnosis not present

## 2023-06-25 DIAGNOSIS — Z800 Family history of malignant neoplasm of digestive organs: Secondary | ICD-10-CM | POA: Insufficient documentation

## 2023-06-25 DIAGNOSIS — K589 Irritable bowel syndrome without diarrhea: Secondary | ICD-10-CM | POA: Insufficient documentation

## 2023-06-25 MED ORDER — FUSION PLUS PO CAPS
1.0000 | ORAL_CAPSULE | Freq: Every day | ORAL | 1 refills | Status: AC
Start: 2023-06-25 — End: ?
  Filled 2023-06-25: qty 90, 90d supply, fill #0
  Filled 2024-02-23: qty 90, 90d supply, fill #1

## 2023-06-25 NOTE — Assessment & Plan Note (Addendum)
#   Anemia- Hb-symptomatic.  Likely due to iron deficiency - from etiology menorrhagia.  NOT on  oral iron.   # s/p IV iron-hemoglobin is improved.  HOLD Venofer today.  Patient will continue maintenance PO iron given ongoing menorrhagia- sent a script for fusion iron.    #Etiology of iron deficiency: Likely secondary to menorrhagia [Dr.jackson].  EGD colonoscopy capsule- ? 2023- NEG.  # DISPOSITION: # HOLD  venofer  today # follow up in  6 months MD: 2-3 days labs- cbc; iron studies; ferritin;  possible venofer-Dr.B

## 2023-06-25 NOTE — Progress Notes (Signed)
 Patient has no concerns

## 2023-06-25 NOTE — Progress Notes (Signed)
 [facility] CONSULT NOTE  Patient Care Team: Doreene Nest, NP as PCP - General (Internal Medicine) Conard Novak, MD as Attending Physician (Obstetrics and Gynecology) Langston Reusing, MD as Consulting Physician (Family Medicine) Earna Coder, MD as Consulting Physician (Oncology)  CHIEF COMPLAINTS/PURPOSE OF CONSULTATION: ANEMIA   HEMATOLOGY HISTORY:   # Chronic iron deficient anemia JAN 2024-ferritin 6; EGD/colonoscopy/capsule 2023- NEG  # Heavy menstrual cycles  # Liver disease: fatty liver.  Hx of ERCP   Latest Reference Range & Units 05/14/22 10:14  Iron 27 - 159 ug/dL 956  UIBC 213 - 086 ug/dL 578  TIBC 469 - 629 ug/dL 528  Ferritin 15 - 413 ng/mL 7 (L)  Iron Saturation 15 - 55 % 29  (L): Data is abnormally low   Latest Reference Range & Units 05/14/22 10:14  Hemoglobin 11.1 - 15.9 g/dL 9.0 (L)  HCT 24.4 - 01.0 % 28.1 (L)  MCV 79 - 97 fL 87  (L): Data is abnormally low    HISTORY OF PRESENTING ILLNESS:  Angela [name] 48 y.o.  female pleasant patient with iron deficiency anemia likely secondary to menorrhagia is here for follow-up.  Patient s/p Venofer iron infusions. Tolerated well.  Patient overall feels better. Last menstrual cycle was better. No blood in stool. Denies dyspnea. Not on PO iron.   Review of Systems  Constitutional:  Negative for chills, diaphoresis, fever and weight loss.  HENT:  Negative for nosebleeds and sore throat.   Eyes:  Negative for double vision.  Respiratory:  Negative for cough, hemoptysis, sputum production and wheezing.   Cardiovascular:  Negative for chest pain, palpitations, orthopnea and leg swelling.  Gastrointestinal:  Negative for abdominal pain, blood in stool, constipation, diarrhea, heartburn, melena, nausea and vomiting.  Genitourinary:  Negative for dysuria, frequency and urgency.  Musculoskeletal:  Negative for back pain and joint pain.  Skin: Negative.  Negative for  itching and rash.  Neurological:  Negative for tingling, focal weakness, weakness and headaches.  Endo/Heme/Allergies:  Does not bruise/bleed easily.  Psychiatric/Behavioral:  Negative for depression. The patient is not nervous/anxious and does not have insomnia.      MEDICAL HISTORY:  Past Medical History:  Diagnosis Date   Allergy Enviromental   Anemia 08/2015   Asthma    as a child   Back pain gerd   Constipation    Dysuria 11/25/2018   Glaucoma    Heart murmur childhood   IBS (irritable bowel syndrome)    Joint pain    Menorrhagia    Muscle spasm 07/04/2018   Palpitations    Plantar fasciitis    Prediabetes    Swelling    feet, legs   Torn meniscus    Right   Vaginal burning 11/25/2018   Voice hoarseness 07/04/2018    SURGICAL HISTORY: Past Surgical History:  Procedure Laterality Date   CHOLECYSTECTOMY  2005   COLONOSCOPY WITH PROPOFOL N/A 10/27/2018   Procedure: COLONOSCOPY WITH PROPOFOL;  Surgeon: Toney Reil, MD;  Location: [facility] ENDOSCOPY;  Service: Gastroenterology;  Laterality: N/A;   COLONOSCOPY WITH PROPOFOL N/A 10/31/2021   Procedure: COLONOSCOPY WITH PROPOFOL;  Surgeon: Toney Reil, MD;  Location: [facility] ENDOSCOPY;  Service: Gastroenterology;  Laterality: N/A;   DILATATION & CURETTAGE/HYSTEROSCOPY WITH MYOSURE N/A 06/23/2018   Procedure: DILATATION & CURETTAGE/HYSTEROSCOPY WITH MYOSURE;  Surgeon: Conard Novak, MD;  Location: [facility] ORS;  Service: Gynecology;  Laterality: N/A;   DILATION AND CURETTAGE OF UTERUS  ERCP     3 times   ESOPHAGOGASTRODUODENOSCOPY (EGD) WITH PROPOFOL  10/27/2018   Procedure: ESOPHAGOGASTRODUODENOSCOPY (EGD) WITH PROPOFOL;  Surgeon: Toney Reil, MD;  Location: [facility] ENDOSCOPY;  Service: Gastroenterology;;   EYE SURGERY     GIVENS CAPSULE STUDY N/A 12/11/2019   Procedure: GIVENS CAPSULE STUDY;  Surgeon: Toney Reil, MD;  Location: [facility] ENDOSCOPY;  Service: Gastroenterology;  Laterality: N/A;    MENISCUS REPAIR Right    x 2    SOCIAL HISTORY: Social History   Socioeconomic History   Marital status: Married    Spouse name: Emergency planning/management officer   Number of children: 0   Years of education: Not on file   Highest education level: Professional school degree (e.g., MD, DDS, DVM, JD)  Occupational History   Occupation: Clinical Pharmacist    Employer:   Tobacco Use   Smoking status: Never   Smokeless tobacco: Never  Vaping Use   Vaping status: Never Used  Substance and Sexual Activity   Alcohol use: Yes    Alcohol/week: 2.0 standard drinks of alcohol    Types: 2 Glasses of wine per week    Comment: occasional   Drug use: No   Sexual activity: Yes    Birth control/protection: None  Other Topics Concern   Not on file  Social History Narrative   Married.   No children.   Works in the [employer].   Enjoys watching movies, walking, reading.    Social Drivers of Corporate investment banker Strain: Low Risk  (06/17/2023)   Overall Financial Resource Strain (CARDIA)    Difficulty of Paying Living Expenses: Not hard at all  Food Insecurity: No Food Insecurity (06/17/2023)   Hunger Vital Sign    Worried About Running Out of Food in the Last Year: Never true    Ran Out of Food in the Last Year: Never true  Transportation Needs: No Transportation Needs (06/17/2023)   PRAPARE - Administrator, Civil Service (Medical): No    Lack of Transportation (Non-Medical): No  Physical Activity: Insufficiently Active (06/17/2023)   Exercise Vital Sign    Days of Exercise per Week: 2 days    Minutes of Exercise per Session: 20 min  Stress: No Stress Concern Present (06/17/2023)   [employer] of Occupational Health - Occupational Stress Questionnaire    Feeling of Stress : Only a little  Social Connections: Unknown (06/17/2023)   Social Connection and Isolation Panel [NHANES]    Frequency of Communication with Friends and Family: Once a week    Frequency of  Social Gatherings with Friends and Family: Patient declined    Attends Religious Services: More than 4 times per year    Active Member of [employer] or Organizations: Yes    Attends Banker Meetings: 1 to 4 times per year    Marital Status: Married  Catering manager Violence: Not At Risk (06/16/2022)   Humiliation, Afraid, Rape, and Kick questionnaire    Fear of Current or Ex-Partner: No    Emotionally Abused: No    Physically Abused: No    Sexually Abused: No    FAMILY HISTORY: Family History  Problem Relation Age of Onset   Colon cancer Mother 57   Diabetes Mother    Hypertension Mother    Arthritis Mother    Transient ischemic attack Mother    Liver disease Mother    Obesity Mother    Diabetes Father    Heart disease Father  Breast cancer Maternal Aunt        mat great aunt   Esophageal cancer Maternal Grandmother    Cancer Maternal Grandmother    Diabetes Maternal Grandmother    Alcohol abuse Maternal Grandfather     ALLERGIES:  is allergic to quinoa-kale-hemp [alimentum].  MEDICATIONS:  Current Outpatient Medications  Medication Sig Dispense Refill   atropine 1 % ophthalmic solution Place 1 drop into the right eye daily. 5 mL 11   brimonidine-timolol (COMBIGAN) 0.2-0.5 % ophthalmic solution Place 1 drop into the right eye every 12 (twelve) hours. 10 mL 11   cetirizine (ZYRTEC) 10 MG tablet Take 10 mg by mouth daily as needed for allergies.     Cholecalciferol (VITAMIN D3) 50 MCG (2000 UT) capsule Take 1 capsule (2,000 Units total) by mouth daily.     Fiber Adult Gummies 2 g CHEW      Iron-FA-B Cmp-C-Biot-Probiotic (FUSION PLUS) CAPS Take 1 capsule by mouth daily at 8 pm. 90 capsule 1   linaclotide (LINZESS) 145 MCG CAPS capsule Take 1 capsule (145 mcg total) by mouth daily before breakfast. 90 capsule 3   tirzepatide (ZEPBOUND) 15 MG/0.5ML Pen Inject 15 mg into the skin once a week. 2 mL 0   No current facility-administered medications for this visit.      Marland Kitchen  PHYSICAL EXAMINATION:   Vitals:   06/25/23 1430  BP: 115/72  Pulse: 86  Resp: 20  Temp: (!) 97.1 F (36.2 C)  SpO2: 100%    Filed Weights   06/25/23 1430  Weight: 195 lb 3.2 oz (88.5 kg)     Physical Exam Vitals and nursing note reviewed.  HENT:     Head: Normocephalic and atraumatic.     Mouth/Throat:     Pharynx: Oropharynx is clear.  Eyes:     Extraocular Movements: Extraocular movements intact.     Pupils: Pupils are equal, round, and reactive to light.  Cardiovascular:     Rate and Rhythm: Normal rate and regular rhythm.  Pulmonary:     Comments: Decreased breath sounds bilaterally.  Abdominal:     Palpations: Abdomen is soft.  Musculoskeletal:        General: Normal range of motion.     Cervical back: Normal range of motion.  Skin:    General: Skin is warm.  Neurological:     General: No focal deficit present.     Mental Status: She is alert and oriented to person, place, and time.  Psychiatric:        Behavior: Behavior normal.        Judgment: Judgment normal.      LABORATORY DATA:  I have reviewed the data as listed Lab Results  Component Value Date   WBC 6.8 06/18/2023   HGB 13.1 06/18/2023   HCT 39.3 06/18/2023   MCV 91.0 06/18/2023   PLT 202 06/18/2023   No results for input(s): "NA", "K", "CL", "CO2", "GLUCOSE", "BUN", "CREATININE", "CALCIUM", "GFRNONAA", "GFRAA", "PROT", "ALBUMIN", "AST", "ALT", "ALKPHOS", "BILITOT", "BILIDIR", "IBILI" in the last 8760 hours.    No results found.  ASSESSMENT & PLAN:   Symptomatic anemia # Anemia- Hb-symptomatic.  Likely due to iron deficiency - from etiology menorrhagia.  NOT on  oral iron.   # s/p IV iron-hemoglobin is improved.  HOLD Venofer today.  Patient will continue maintenance PO iron given ongoing menorrhagia- sent a script for fusion iron.    #Etiology of iron deficiency: Likely secondary to menorrhagia [Dr.jackson].  EGD colonoscopy capsule- ?  2023- NEG.  # DISPOSITION: # HOLD   venofer  today # follow up in  6 months MD: 2-3 days labs- cbc; iron studies; ferritin;  possible venofer-Dr.B   All questions were answered. The patient knows to call the clinic with any problems, questions or concerns.   Earna Coder, MD 06/25/2023 3:04 PM

## 2023-06-28 ENCOUNTER — Other Ambulatory Visit: Payer: Self-pay

## 2023-07-09 ENCOUNTER — Other Ambulatory Visit: Payer: Self-pay

## 2023-07-09 DIAGNOSIS — H4031X3 Glaucoma secondary to eye trauma, right eye, severe stage: Secondary | ICD-10-CM | POA: Diagnosis not present

## 2023-07-09 DIAGNOSIS — E663 Overweight: Secondary | ICD-10-CM

## 2023-07-09 DIAGNOSIS — Z6829 Body mass index (BMI) 29.0-29.9, adult: Secondary | ICD-10-CM

## 2023-07-09 MED ORDER — BRIMONIDINE TARTRATE-TIMOLOL 0.2-0.5 % OP SOLN
1.0000 [drp] | Freq: Two times a day (BID) | OPHTHALMIC | 11 refills | Status: AC
  Filled 2023-07-09: qty 15, 150d supply, fill #0
  Filled 2023-07-09: qty 15, 90d supply, fill #0
  Filled 2023-07-17 – 2023-07-26 (×2): qty 15, 150d supply, fill #0
  Filled 2023-11-29: qty 10, 90d supply, fill #0
  Filled 2024-02-04: qty 15, 150d supply, fill #1

## 2023-07-09 MED ORDER — ATROPINE SULFATE 1 % OP SOLN
1.0000 [drp] | Freq: Every day | OPHTHALMIC | 11 refills | Status: AC
  Filled 2023-07-09 – 2023-08-16 (×2): qty 5, 90d supply, fill #0
  Filled 2023-10-26: qty 5, 90d supply, fill #1
  Filled 2023-11-29: qty 5, 90d supply, fill #2
  Filled 2024-05-08: qty 5, 90d supply, fill #3

## 2023-07-10 ENCOUNTER — Other Ambulatory Visit: Payer: Self-pay

## 2023-07-12 ENCOUNTER — Other Ambulatory Visit (HOSPITAL_COMMUNITY): Payer: Self-pay

## 2023-07-12 ENCOUNTER — Other Ambulatory Visit: Payer: Self-pay

## 2023-07-12 MED ORDER — TIRZEPATIDE-WEIGHT MANAGEMENT 15 MG/0.5ML ~~LOC~~ SOAJ
15.0000 mg | SUBCUTANEOUS | 0 refills | Status: DC
  Filled 2023-07-12: qty 2, 28d supply, fill #0
  Filled 2023-08-11: qty 2, 28d supply, fill #1
  Filled 2023-09-07: qty 2, 28d supply, fill #2

## 2023-07-13 ENCOUNTER — Other Ambulatory Visit: Payer: Self-pay

## 2023-07-18 ENCOUNTER — Other Ambulatory Visit: Payer: Self-pay

## 2023-07-26 ENCOUNTER — Other Ambulatory Visit: Payer: Self-pay

## 2023-07-27 ENCOUNTER — Other Ambulatory Visit: Payer: Self-pay

## 2023-08-05 ENCOUNTER — Other Ambulatory Visit: Payer: Self-pay

## 2023-08-12 ENCOUNTER — Encounter: Payer: Self-pay | Admitting: Internal Medicine

## 2023-08-12 ENCOUNTER — Encounter (HOSPITAL_COMMUNITY): Payer: Self-pay

## 2023-08-12 ENCOUNTER — Other Ambulatory Visit (HOSPITAL_COMMUNITY): Payer: Self-pay

## 2023-08-16 ENCOUNTER — Other Ambulatory Visit (HOSPITAL_COMMUNITY): Payer: Self-pay

## 2023-08-16 ENCOUNTER — Other Ambulatory Visit: Payer: Self-pay

## 2023-08-30 ENCOUNTER — Other Ambulatory Visit: Payer: Self-pay | Admitting: Gastroenterology

## 2023-08-30 DIAGNOSIS — K5909 Other constipation: Secondary | ICD-10-CM

## 2023-08-31 ENCOUNTER — Other Ambulatory Visit: Payer: Self-pay

## 2023-08-31 ENCOUNTER — Other Ambulatory Visit: Payer: Self-pay | Admitting: Gastroenterology

## 2023-08-31 ENCOUNTER — Encounter: Payer: Self-pay | Admitting: Internal Medicine

## 2023-08-31 DIAGNOSIS — K5909 Other constipation: Secondary | ICD-10-CM

## 2023-08-31 MED FILL — Linaclotide Cap 145 MCG: ORAL | 90 days supply | Qty: 90 | Fill #0 | Status: AC

## 2023-09-07 ENCOUNTER — Other Ambulatory Visit: Payer: Self-pay

## 2023-10-01 ENCOUNTER — Other Ambulatory Visit: Payer: Self-pay

## 2023-10-01 ENCOUNTER — Other Ambulatory Visit: Payer: Self-pay | Admitting: Primary Care

## 2023-10-01 ENCOUNTER — Other Ambulatory Visit (HOSPITAL_COMMUNITY): Payer: Self-pay

## 2023-10-01 DIAGNOSIS — Z6829 Body mass index (BMI) 29.0-29.9, adult: Secondary | ICD-10-CM

## 2023-10-01 MED ORDER — ZEPBOUND 15 MG/0.5ML ~~LOC~~ SOAJ
15.0000 mg | SUBCUTANEOUS | 0 refills | Status: DC
  Filled 2023-10-01: qty 2, 28d supply, fill #0
  Filled 2023-11-04: qty 2, 28d supply, fill #1
  Filled 2023-12-18: qty 2, 28d supply, fill #2

## 2023-10-26 ENCOUNTER — Encounter: Payer: Self-pay | Admitting: Internal Medicine

## 2023-10-26 ENCOUNTER — Other Ambulatory Visit: Payer: Self-pay

## 2023-10-27 ENCOUNTER — Other Ambulatory Visit: Payer: Self-pay

## 2023-11-04 ENCOUNTER — Other Ambulatory Visit: Payer: Self-pay

## 2023-11-23 ENCOUNTER — Encounter: Payer: Self-pay | Admitting: Internal Medicine

## 2023-11-29 ENCOUNTER — Encounter: Payer: Self-pay | Admitting: Internal Medicine

## 2023-11-29 ENCOUNTER — Other Ambulatory Visit: Payer: Self-pay

## 2023-11-29 MED FILL — Linaclotide Cap 145 MCG: ORAL | 90 days supply | Qty: 90 | Fill #1 | Status: AC

## 2023-12-08 ENCOUNTER — Encounter: Payer: Self-pay | Admitting: Primary Care

## 2023-12-08 ENCOUNTER — Ambulatory Visit (INDEPENDENT_AMBULATORY_CARE_PROVIDER_SITE_OTHER): Admitting: Primary Care

## 2023-12-08 ENCOUNTER — Other Ambulatory Visit: Payer: Self-pay | Admitting: Primary Care

## 2023-12-08 ENCOUNTER — Ambulatory Visit: Payer: Self-pay | Admitting: Primary Care

## 2023-12-08 VITALS — BP 126/82 | HR 80 | Temp 97.2°F | Ht 68.0 in | Wt 192.0 lb

## 2023-12-08 DIAGNOSIS — E559 Vitamin D deficiency, unspecified: Secondary | ICD-10-CM

## 2023-12-08 DIAGNOSIS — E663 Overweight: Secondary | ICD-10-CM | POA: Diagnosis not present

## 2023-12-08 DIAGNOSIS — K5909 Other constipation: Secondary | ICD-10-CM

## 2023-12-08 DIAGNOSIS — N6310 Unspecified lump in the right breast, unspecified quadrant: Secondary | ICD-10-CM

## 2023-12-08 DIAGNOSIS — N63 Unspecified lump in unspecified breast: Secondary | ICD-10-CM

## 2023-12-08 DIAGNOSIS — Z0000 Encounter for general adult medical examination without abnormal findings: Secondary | ICD-10-CM | POA: Diagnosis not present

## 2023-12-08 DIAGNOSIS — Z6829 Body mass index (BMI) 29.0-29.9, adult: Secondary | ICD-10-CM | POA: Diagnosis not present

## 2023-12-08 DIAGNOSIS — Z1231 Encounter for screening mammogram for malignant neoplasm of breast: Secondary | ICD-10-CM

## 2023-12-08 DIAGNOSIS — D509 Iron deficiency anemia, unspecified: Secondary | ICD-10-CM

## 2023-12-08 DIAGNOSIS — Z800 Family history of malignant neoplasm of digestive organs: Secondary | ICD-10-CM | POA: Diagnosis not present

## 2023-12-08 DIAGNOSIS — R7303 Prediabetes: Secondary | ICD-10-CM

## 2023-12-08 LAB — LIPID PANEL
Cholesterol: 149 mg/dL (ref 0–200)
HDL: 52.2 mg/dL (ref >39.00)
LDL Cholesterol: 74 mg/dL (ref 0–99)
NonHDL: 97.23
Total CHOL/HDL Ratio: 3
Triglycerides: 116 mg/dL (ref 0.0–149.0)
VLDL: 23.2 mg/dL (ref 0.0–40.0)

## 2023-12-08 LAB — COMPREHENSIVE METABOLIC PANEL WITH GFR
ALT: 33 U/L (ref 0–35)
AST: 24 U/L (ref 0–37)
Albumin: 4.1 g/dL (ref 3.5–5.2)
Alkaline Phosphatase: 63 U/L (ref 39–117)
BUN: 15 mg/dL (ref 6–23)
CO2: 29 meq/L (ref 19–32)
Calcium: 9.2 mg/dL (ref 8.4–10.5)
Chloride: 105 meq/L (ref 96–112)
Creatinine, Ser: 0.67 mg/dL (ref 0.40–1.20)
GFR: 102.85 mL/min (ref >60.00)
Glucose, Bld: 99 mg/dL (ref 70–99)
Potassium: 4.4 meq/L (ref 3.5–5.1)
Sodium: 141 meq/L (ref 135–145)
Total Bilirubin: 0.5 mg/dL (ref 0.2–1.2)
Total Protein: 7.1 g/dL (ref 6.0–8.3)

## 2023-12-08 LAB — HEMOGLOBIN A1C: Hgb A1c MFr Bld: 5.6 % (ref 4.6–6.5)

## 2023-12-08 LAB — VITAMIN D 25 HYDROXY (VIT D DEFICIENCY, FRACTURES): VITD: 52 ng/mL (ref 30.00–100.00)

## 2023-12-08 NOTE — Assessment & Plan Note (Signed)
 Stable and commended on weight loss!  Continue Zepbound  15 mg weekly. Labs pending. Follow-up in 6 months.

## 2023-12-08 NOTE — Progress Notes (Signed)
 Subjective:    Patient ID: Angela [name], female    DOB: Sep 05, 1975, 48 y.o.   MRN: [id number]  Angela [name] is a very pleasant 48 y.o. female who presents today for complete physical and follow up of chronic conditions.  Immunizations: -Tetanus: Completed in 2016  Diet: Fair diet.  Exercise: No regular exercise.  Eye exam: Completes annually  Dental exam: Completes semi-annually    Pap Smear: Completed in August 2023 Mammogram: Completed in September 2024  Colonoscopy: Completed in 2023, due 2028   BP Readings from Last 3 Encounters:  12/08/23 126/82  06/25/23 115/72  06/17/23 106/70    Wt Readings from Last 3 Encounters:  12/08/23 192 lb (87.1 kg)  06/25/23 195 lb 3.2 oz (88.5 kg)  06/17/23 194 lb (88 kg)      Review of Systems  Constitutional:  Negative for unexpected weight change.  HENT:  Negative for rhinorrhea.   Respiratory:  Negative for cough and shortness of breath.   Cardiovascular:  Negative for chest pain.  Gastrointestinal:  Negative for constipation and diarrhea.  Genitourinary:  Negative for difficulty urinating and menstrual problem.  Musculoskeletal:  Negative for arthralgias and myalgias.  Skin:  Negative for rash.  Allergic/Immunologic: Negative for environmental allergies.  Neurological:  Negative for dizziness and headaches.  Psychiatric/Behavioral:  The patient is not nervous/anxious.          Past Medical History:  Diagnosis Date   Allergy Enviromental   Anemia 08/2015   Asthma    as a child   Back pain gerd   Class 1 obesity due to excess calories with body mass index (BMI) of 30.0 to 30.9 in adult 01/04/2020   Constipation    Dysuria 11/25/2018   Glaucoma    Heart murmur childhood   IBS (irritable bowel syndrome)    Joint pain    Menorrhagia    Muscle spasm 07/04/2018   Palpitations    Plantar fasciitis    Prediabetes    Swelling    feet, legs   Torn meniscus    Right   Vaginal burning  11/25/2018   Voice hoarseness 07/04/2018    Social History   Socioeconomic History   Marital status: Married    Spouse name: Emergency planning/management officer   Number of children: 0   Years of education: Not on file   Highest education level: Professional school degree (e.g., MD, DDS, DVM, JD)  Occupational History   Occupation: Clinical Pharmacist    Employer: [employer]  Tobacco Use   Smoking status: Never   Smokeless tobacco: Never  Vaping Use   Vaping status: Never Used  Substance and Sexual Activity   Alcohol use: Yes    Alcohol/week: 2.0 standard drinks of alcohol    Types: 2 Glasses of wine per week    Comment: occasional   Drug use: No   Sexual activity: Yes    Birth control/protection: None  Other Topics Concern   Not on file  Social History Narrative   Married.   No children.   Works in the [employer].   Enjoys watching movies, walking, reading.    Social Drivers of Corporate investment banker Strain: Low Risk  (06/17/2023)   Overall Financial Resource Strain (CARDIA)    Difficulty of Paying Living Expenses: Not hard at all  Food Insecurity: No Food Insecurity (06/17/2023)   Hunger Vital Sign    Worried About Running Out of Food in the Last Year: Never true    Ran  Out of Food in the Last Year: Never true  Transportation Needs: No Transportation Needs (06/17/2023)   PRAPARE - Administrator, Civil Service (Medical): No    Lack of Transportation (Non-Medical): No  Physical Activity: Insufficiently Active (06/17/2023)   Exercise Vital Sign    Days of Exercise per Week: 2 days    Minutes of Exercise per Session: 20 min  Stress: No Stress Concern Present (06/17/2023)   [employer] of Occupational Health - Occupational Stress Questionnaire    Feeling of Stress : Only a little  Social Connections: Unknown (06/17/2023)   Social Connection and Isolation Panel    Frequency of Communication with Friends and Family: Once a week    Frequency of Social  Gatherings with Friends and Family: Patient declined    Attends Religious Services: More than 4 times per year    Active Member of Clubs or Organizations: Yes    Attends Banker Meetings: 1 to 4 times per year    Marital Status: Married  Catering manager Violence: Not At Risk (06/16/2022)   Humiliation, Afraid, Rape, and Kick questionnaire    Fear of Current or Ex-Partner: No    Emotionally Abused: No    Physically Abused: No    Sexually Abused: No    Past Surgical History:  Procedure Laterality Date   CHOLECYSTECTOMY  2005   COLONOSCOPY WITH PROPOFOL  N/A 10/27/2018   Procedure: COLONOSCOPY WITH PROPOFOL ;  Surgeon: Unk Corinn Skiff, MD;  Location: [facility] ENDOSCOPY;  Service: Gastroenterology;  Laterality: N/A;   COLONOSCOPY WITH PROPOFOL  N/A 10/31/2021   Procedure: COLONOSCOPY WITH PROPOFOL ;  Surgeon: Unk Corinn Skiff, MD;  Location: [facility] ENDOSCOPY;  Service: Gastroenterology;  Laterality: N/A;   DILATATION & CURETTAGE/HYSTEROSCOPY WITH MYOSURE N/A 06/23/2018   Procedure: DILATATION & CURETTAGE/HYSTEROSCOPY WITH MYOSURE;  Surgeon: Leonce Garnette BIRCH, MD;  Location: [facility] ORS;  Service: Gynecology;  Laterality: N/A;   DILATION AND CURETTAGE OF UTERUS     ERCP     3 times   ESOPHAGOGASTRODUODENOSCOPY (EGD) WITH PROPOFOL   10/27/2018   Procedure: ESOPHAGOGASTRODUODENOSCOPY (EGD) WITH PROPOFOL ;  Surgeon: Unk Corinn Skiff, MD;  Location: [facility] ENDOSCOPY;  Service: Gastroenterology;;   EYE SURGERY     GIVENS CAPSULE STUDY N/A 12/11/2019   Procedure: GIVENS CAPSULE STUDY;  Surgeon: Unk Corinn Skiff, MD;  Location: [facility] ENDOSCOPY;  Service: Gastroenterology;  Laterality: N/A;   MENISCUS REPAIR Right    x 2    Family History  Problem Relation Age of Onset   Colon cancer Mother 42   Diabetes Mother    Hypertension Mother    Arthritis Mother    Transient ischemic attack Mother    Liver disease Mother    Obesity Mother    Diabetes Father    Heart disease Father    Breast  cancer Maternal Aunt        mat great aunt   Esophageal cancer Maternal Grandmother    Cancer Maternal Grandmother    Diabetes Maternal Grandmother    Alcohol abuse Maternal Grandfather     Allergies  Allergen Reactions   Quinoa-Kale-Hemp [Alimentum]     Current Outpatient Medications on File Prior to Visit  Medication Sig Dispense Refill   atropine  1 % ophthalmic solution Place 1 drop into the right eye daily. 5 mL 11   brimonidine -timolol  (COMBIGAN ) 0.2-0.5 % ophthalmic solution Place 1 drop into the right eye 2 (two) times daily. 15 mL 11   cetirizine (ZYRTEC) 10 MG tablet Take 10 mg by  mouth daily as needed for allergies.     Cholecalciferol  (VITAMIN D3) 50 MCG (2000 UT) capsule Take 1 capsule (2,000 Units total) by mouth daily.     Fiber Adult Gummies 2 g CHEW      Iron -FA-B Cmp-C-Biot-Probiotic (FUSION PLUS) CAPS Take 1 capsule by mouth daily at 8 pm. 90 capsule 1   linaclotide  (LINZESS ) 145 MCG CAPS capsule Take 1 capsule (145 mcg total) by mouth daily before breakfast. 90 capsule 3   tirzepatide  (ZEPBOUND ) 15 MG/0.5ML Pen Inject 15 mg into the skin once a week. 6 mL 0   brimonidine -timolol  (COMBIGAN ) 0.2-0.5 % ophthalmic solution Place 1 drop into the right eye every 12 (twelve) hours. (Patient not taking: Reported on 12/08/2023) 10 mL 11   No current facility-administered medications on file prior to visit.    BP 126/82   Pulse 80   Temp (!) 97.2 F (36.2 C) (Temporal)   Ht 5' 8 (1.727 m)   Wt 192 lb (87.1 kg)   LMP 11/04/2023   SpO2 98%   BMI 29.19 kg/m  Objective:   Physical Exam HENT:     Right Ear: Tympanic membrane and ear canal normal.     Left Ear: Tympanic membrane and ear canal normal.  Eyes:     Pupils: Pupils are equal, round, and reactive to light.  Cardiovascular:     Rate and Rhythm: Normal rate and regular rhythm.  Pulmonary:     Effort: Pulmonary effort is normal.     Breath sounds: Normal breath sounds.  Abdominal:     General: Bowel sounds  are normal.     Palpations: Abdomen is soft.     Tenderness: There is no abdominal tenderness.  Musculoskeletal:        General: Normal range of motion.     Cervical back: Neck supple.  Skin:    General: Skin is warm and dry.  Neurological:     Mental Status: She is alert and oriented to person, place, and time.     Cranial Nerves: No cranial nerve deficit.     Deep Tendon Reflexes:     Reflex Scores:      Patellar reflexes are 2+ on the right side and 2+ on the left side. Psychiatric:        Mood and Affect: Mood normal.     Physical Exam        Assessment & Plan:  Preventative health care Assessment & Plan: Immunizations UTD. Pap smear UTD. Mammogram due, orders placed. Colonoscopy UTD, due 2028  Discussed the importance of a healthy diet and regular exercise in order for weight loss, and to reduce the risk of further co-morbidity.  Exam stable. Labs pending.  Follow up in 1 year for repeat physical.    Iron  deficiency anemia, unspecified iron  deficiency anemia type Assessment & Plan: Controlled.  Following with hematology. Reviewed office notes from March 2025.  Continue iron  supplement daily.   Chronic constipation Assessment & Plan: Improving!  Continue Linzess  145 mcg every other days. Continue exercise.    Prediabetes Assessment & Plan: Repeat A1C pending.  Orders: -     Comprehensive metabolic panel with GFR -     Lipid panel -     Hemoglobin A1c  Vitamin D  deficiency -     VITAMIN D  25 Hydroxy (Vit-D Deficiency, Fractures)  Screening mammogram for breast cancer -     3D Screening Mammogram, Left and Right; Future  Overweight with body mass index (BMI) of  29 to 29.9 in adult Assessment & Plan: Stable and commended on weight loss!  Continue Zepbound  15 mg weekly. Labs pending. Follow-up in 6 months.   Family history of colon cancer in mother Assessment & Plan: Repeat colonoscopy in 2028     Assessment and  Plan Assessment & Plan         Comer MARLA Gaskins, NP    Discussed the use of AI scribe software for clinical note transcription with the patient, who gave verbal consent to proceed.  History of Present Illness

## 2023-12-08 NOTE — Assessment & Plan Note (Signed)
Immunizations UTD. Pap smear UTD. Mammogram due, orders placed. Colonoscopy UTD, due 2028  Discussed the importance of a healthy diet and regular exercise in order for weight loss, and to reduce the risk of further co-morbidity.  Exam stable. Labs pending.  Follow up in 1 year for repeat physical.

## 2023-12-08 NOTE — Assessment & Plan Note (Signed)
 Repeat colonoscopy in 2028

## 2023-12-08 NOTE — Assessment & Plan Note (Signed)
 Repeat A1C pending.

## 2023-12-08 NOTE — Assessment & Plan Note (Signed)
 Improving!  Continue Linzess  145 mcg every other days. Continue exercise.

## 2023-12-08 NOTE — Patient Instructions (Signed)
 Stop by the lab prior to leaving today. I will notify you of your results once received.   [facility] to schedule your [facility].   Please schedule a follow up visit for 6 months for a diabetes check.  It was a pleasure to see you today!

## 2023-12-08 NOTE — Assessment & Plan Note (Signed)
 Controlled.  Following with hematology. Reviewed office notes from March 2025.  Continue iron  supplement daily.

## 2023-12-15 ENCOUNTER — Encounter: Payer: 59 | Admitting: Primary Care

## 2023-12-20 ENCOUNTER — Other Ambulatory Visit (HOSPITAL_COMMUNITY): Payer: Self-pay

## 2023-12-22 ENCOUNTER — Other Ambulatory Visit: Payer: Self-pay

## 2023-12-23 ENCOUNTER — Encounter: Payer: Self-pay | Admitting: Pharmacist

## 2023-12-23 ENCOUNTER — Other Ambulatory Visit: Payer: Self-pay

## 2023-12-23 ENCOUNTER — Other Ambulatory Visit (HOSPITAL_COMMUNITY): Payer: Self-pay

## 2023-12-26 DIAGNOSIS — Z23 Encounter for immunization: Secondary | ICD-10-CM

## 2023-12-27 ENCOUNTER — Inpatient Hospital Stay: Attending: Internal Medicine

## 2023-12-27 DIAGNOSIS — D509 Iron deficiency anemia, unspecified: Secondary | ICD-10-CM | POA: Insufficient documentation

## 2023-12-27 DIAGNOSIS — Z800 Family history of malignant neoplasm of digestive organs: Secondary | ICD-10-CM | POA: Insufficient documentation

## 2023-12-27 DIAGNOSIS — N920 Excessive and frequent menstruation with regular cycle: Secondary | ICD-10-CM | POA: Diagnosis not present

## 2023-12-27 DIAGNOSIS — Z803 Family history of malignant neoplasm of breast: Secondary | ICD-10-CM | POA: Diagnosis not present

## 2023-12-27 DIAGNOSIS — D649 Anemia, unspecified: Secondary | ICD-10-CM

## 2023-12-27 LAB — IRON AND TIBC
Iron: 78 ug/dL (ref 28–170)
Saturation Ratios: 23 % (ref 10.4–31.8)
TIBC: 335 ug/dL (ref 250–450)
UIBC: 257 ug/dL

## 2023-12-27 LAB — CBC WITH DIFFERENTIAL (CANCER CENTER ONLY)
Abs Immature Granulocytes: 0.03 K/uL (ref 0.00–0.07)
Basophils Absolute: 0 K/uL (ref 0.0–0.1)
Basophils Relative: 0 %
Eosinophils Absolute: 0.2 K/uL (ref 0.0–0.5)
Eosinophils Relative: 2 %
HCT: 38.4 % (ref 36.0–46.0)
Hemoglobin: 12.9 g/dL (ref 12.0–15.0)
Immature Granulocytes: 1 %
Lymphocytes Relative: 30 %
Lymphs Abs: 2 K/uL (ref 0.7–4.0)
MCH: 30.2 pg (ref 26.0–34.0)
MCHC: 33.6 g/dL (ref 30.0–36.0)
MCV: 89.9 fL (ref 80.0–100.0)
Monocytes Absolute: 0.6 K/uL (ref 0.1–1.0)
Monocytes Relative: 9 %
Neutro Abs: 3.8 K/uL (ref 1.7–7.7)
Neutrophils Relative %: 58 %
Platelet Count: 180 K/uL (ref 150–400)
RBC: 4.27 MIL/uL (ref 3.87–5.11)
RDW: 13.2 % (ref 11.5–15.5)
WBC Count: 6.6 K/uL (ref 4.0–10.5)
nRBC: 0 % (ref 0.0–0.2)

## 2023-12-27 LAB — FERRITIN: Ferritin: 39 ng/mL (ref 11–307)

## 2023-12-27 MED ORDER — COVID-19 MRNA VACC (MODERNA) 50 MCG/0.5ML IM SUSP: 0.5000 mL | Freq: Once | INTRAMUSCULAR | 0 refills | Status: AC

## 2023-12-28 ENCOUNTER — Other Ambulatory Visit (HOSPITAL_COMMUNITY): Payer: Self-pay

## 2023-12-31 ENCOUNTER — Ambulatory Visit: Payer: Self-pay | Admitting: Primary Care

## 2023-12-31 ENCOUNTER — Ambulatory Visit
Admission: RE | Admit: 2023-12-31 | Discharge: 2023-12-31 | Disposition: A | Discharge status: Home / Self Care | Source: Ambulatory Visit | Attending: Primary Care | Admitting: Primary Care

## 2023-12-31 ENCOUNTER — Encounter: Payer: Self-pay | Admitting: Internal Medicine

## 2023-12-31 ENCOUNTER — Inpatient Hospital Stay

## 2023-12-31 ENCOUNTER — Ambulatory Visit: Admission: RE | Admit: 2023-12-31 | Source: Ambulatory Visit

## 2023-12-31 ENCOUNTER — Inpatient Hospital Stay (HOSPITAL_BASED_OUTPATIENT_CLINIC_OR_DEPARTMENT_OTHER): Admitting: Internal Medicine

## 2023-12-31 VITALS — BP 120/74 | HR 92 | Temp 97.6°F | Resp 20 | Ht 68.0 in | Wt 194.2 lb

## 2023-12-31 DIAGNOSIS — N920 Excessive and frequent menstruation with regular cycle: Secondary | ICD-10-CM | POA: Diagnosis not present

## 2023-12-31 DIAGNOSIS — N6310 Unspecified lump in the right breast, unspecified quadrant: Secondary | ICD-10-CM | POA: Insufficient documentation

## 2023-12-31 DIAGNOSIS — R92333 Mammographic heterogeneous density, bilateral breasts: Secondary | ICD-10-CM | POA: Diagnosis not present

## 2023-12-31 DIAGNOSIS — N63 Unspecified lump in unspecified breast: Secondary | ICD-10-CM | POA: Diagnosis not present

## 2023-12-31 DIAGNOSIS — D509 Iron deficiency anemia, unspecified: Secondary | ICD-10-CM | POA: Diagnosis not present

## 2023-12-31 DIAGNOSIS — Z803 Family history of malignant neoplasm of breast: Secondary | ICD-10-CM | POA: Diagnosis not present

## 2023-12-31 DIAGNOSIS — Z1231 Encounter for screening mammogram for malignant neoplasm of breast: Secondary | ICD-10-CM | POA: Diagnosis not present

## 2023-12-31 DIAGNOSIS — D649 Anemia, unspecified: Secondary | ICD-10-CM | POA: Diagnosis not present

## 2023-12-31 DIAGNOSIS — Z800 Family history of malignant neoplasm of digestive organs: Secondary | ICD-10-CM | POA: Diagnosis not present

## 2023-12-31 DIAGNOSIS — R928 Other abnormal and inconclusive findings on diagnostic imaging of breast: Secondary | ICD-10-CM | POA: Diagnosis not present

## 2023-12-31 NOTE — Progress Notes (Signed)
 Patient has no concerns

## 2023-12-31 NOTE — Progress Notes (Signed)
 [facility] CONSULT NOTE  Patient Care Team: Gretta Comer POUR, NP as PCP - General (Internal Medicine) Leonce Garnette BIRCH, MD as Attending Physician (Obstetrics and Gynecology) Berkeley Adelita PENNER, MD as Consulting Physician (Family Medicine) Rennie Cindy SAUNDERS, MD as Consulting Physician (Oncology)  CHIEF COMPLAINTS/PURPOSE OF CONSULTATION: ANEMIA   HEMATOLOGY HISTORY:   # Chronic iron  deficient anemia JAN 2024-ferritin 6; EGD/colonoscopy/capsule 2023- NEG  # Heavy menstrual cycles  # Liver disease: fatty liver.  Hx of ERCP   Latest Reference Range & Units 05/14/22 10:14  Iron  27 - 159 ug/dL 883  UIBC 868 - 574 ug/dL 717  TIBC 749 - 549 ug/dL 601  Ferritin 15 - 849 ng/mL 7 (L)  Iron  Saturation 15 - 55 % 29  (L): Data is abnormally low   Latest Reference Range & Units 05/14/22 10:14  Hemoglobin 11.1 - 15.9 g/dL 9.0 (L)  HCT 65.9 - 53.3 % 28.1 (L)  MCV 79 - 97 fL 87  (L): Data is abnormally low    HISTORY OF PRESENTING ILLNESS:  Angela [name] 48 y.o.  female pleasant patient with iron  deficiency anemia likely secondary to menorrhagia is here for follow-up.  Patient s/p Venofer  iron  infusions. Tolerated well.Currently on PO iron . Tolerating well.   Patient overall feels better. Last menstrual cycle was better. No blood in stool. Denies dyspnea.   Review of Systems  Constitutional:  Negative for chills, diaphoresis, fever and weight loss.  HENT:  Negative for nosebleeds and sore throat.   Eyes:  Negative for double vision.  Respiratory:  Negative for cough, hemoptysis, sputum production and wheezing.   Cardiovascular:  Negative for chest pain, palpitations, orthopnea and leg swelling.  Gastrointestinal:  Negative for abdominal pain, blood in stool, constipation, diarrhea, heartburn, melena, nausea and vomiting.  Genitourinary:  Negative for dysuria, frequency and urgency.  Musculoskeletal:  Negative for back pain and joint pain.  Skin:  Negative.  Negative for itching and rash.  Neurological:  Negative for tingling, focal weakness, weakness and headaches.  Endo/Heme/Allergies:  Does not bruise/bleed easily.  Psychiatric/Behavioral:  Negative for depression. The patient is not nervous/anxious and does not have insomnia.      MEDICAL HISTORY:  Past Medical History:  Diagnosis Date   Allergy Enviromental   Anemia 08/2015   Asthma    as a child   Back pain gerd   Class 1 obesity due to excess calories with body mass index (BMI) of 30.0 to 30.9 in adult 01/04/2020   Constipation    Dysuria 11/25/2018   Glaucoma    Heart murmur childhood   IBS (irritable bowel syndrome)    Joint pain    Menorrhagia    Muscle spasm 07/04/2018   Palpitations    Plantar fasciitis    Prediabetes    Swelling    feet, legs   Torn meniscus    Right   Vaginal burning 11/25/2018   Voice hoarseness 07/04/2018    SURGICAL HISTORY: Past Surgical History:  Procedure Laterality Date   CHOLECYSTECTOMY  2005   COLONOSCOPY WITH PROPOFOL  N/A 10/27/2018   Procedure: COLONOSCOPY WITH PROPOFOL ;  Surgeon: Unk Corinn Skiff, MD;  Location: [facility] ENDOSCOPY;  Service: Gastroenterology;  Laterality: N/A;   COLONOSCOPY WITH PROPOFOL  N/A 10/31/2021   Procedure: COLONOSCOPY WITH PROPOFOL ;  Surgeon: Unk Corinn Skiff, MD;  Location: [facility] ENDOSCOPY;  Service: Gastroenterology;  Laterality: N/A;   DILATATION & CURETTAGE/HYSTEROSCOPY WITH MYOSURE N/A 06/23/2018   Procedure: DILATATION & CURETTAGE/HYSTEROSCOPY WITH MYOSURE;  Surgeon: Leonce Garnette  D, MD;  Location: [facility] ORS;  Service: Gynecology;  Laterality: N/A;   DILATION AND CURETTAGE OF UTERUS     ERCP     3 times   ESOPHAGOGASTRODUODENOSCOPY (EGD) WITH PROPOFOL   10/27/2018   Procedure: ESOPHAGOGASTRODUODENOSCOPY (EGD) WITH PROPOFOL ;  Surgeon: Unk Corinn Skiff, MD;  Location: [facility] ENDOSCOPY;  Service: Gastroenterology;;   EYE SURGERY     GIVENS CAPSULE STUDY N/A 12/11/2019   Procedure: GIVENS  CAPSULE STUDY;  Surgeon: Unk Corinn Skiff, MD;  Location: [facility] ENDOSCOPY;  Service: Gastroenterology;  Laterality: N/A;   MENISCUS REPAIR Right    x 2    SOCIAL HISTORY: Social History   Socioeconomic History   Marital status: Married    Spouse name: Emergency planning/management officer   Number of children: 0   Years of education: Not on file   Highest education level: Professional school degree (e.g., MD, DDS, DVM, JD)  Occupational History   Occupation: Clinical Pharmacist    Employer: [employer]  Tobacco Use   Smoking status: Never   Smokeless tobacco: Never  Vaping Use   Vaping status: Never Used  Substance and Sexual Activity   Alcohol use: Yes    Alcohol/week: 2.0 standard drinks of alcohol    Types: 2 Glasses of wine per week    Comment: occasional   Drug use: No   Sexual activity: Yes    Birth control/protection: None  Other Topics Concern   Not on file  Social History Narrative   Married.   No children.   Works in the [employer].   Enjoys watching movies, walking, reading.    Social Drivers of Corporate investment banker Strain: Low Risk  (06/17/2023)   Overall Financial Resource Strain (CARDIA)    Difficulty of Paying Living Expenses: Not hard at all  Food Insecurity: No Food Insecurity (06/17/2023)   Hunger Vital Sign    Worried About Running Out of Food in the Last Year: Never true    Ran Out of Food in the Last Year: Never true  Transportation Needs: No Transportation Needs (06/17/2023)   PRAPARE - Administrator, Civil Service (Medical): No    Lack of Transportation (Non-Medical): No  Physical Activity: Insufficiently Active (06/17/2023)   Exercise Vital Sign    Days of Exercise per Week: 2 days    Minutes of Exercise per Session: 20 min  Stress: No Stress Concern Present (06/17/2023)   [employer] of Occupational Health - Occupational Stress Questionnaire    Feeling of Stress : Only a little  Social Connections: Unknown (06/17/2023)   Social  Connection and Isolation Panel    Frequency of Communication with Friends and Family: Once a week    Frequency of Social Gatherings with Friends and Family: Patient declined    Attends Religious Services: More than 4 times per year    Active Member of [employer] or Organizations: Yes    Attends Banker Meetings: 1 to 4 times per year    Marital Status: Married  Catering manager Violence: Not At Risk (06/16/2022)   Humiliation, Afraid, Rape, and Kick questionnaire    Fear of Current or Ex-Partner: No    Emotionally Abused: No    Physically Abused: No    Sexually Abused: No    FAMILY HISTORY: Family History  Problem Relation Age of Onset   Colon cancer Mother 17   Diabetes Mother    Hypertension Mother    Arthritis Mother    Transient ischemic attack Mother  Liver disease Mother    Obesity Mother    Diabetes Father    Heart disease Father    Breast cancer Maternal Aunt        mat great aunt   Esophageal cancer Maternal Grandmother    Cancer Maternal Grandmother    Diabetes Maternal Grandmother    Alcohol abuse Maternal Grandfather     ALLERGIES:  is allergic to quinoa-kale-hemp [alimentum].  MEDICATIONS:  Current Outpatient Medications  Medication Sig Dispense Refill   atropine  1 % ophthalmic solution Place 1 drop into the right eye daily. 5 mL 11   brimonidine -timolol  (COMBIGAN ) 0.2-0.5 % ophthalmic solution Place 1 drop into the right eye 2 (two) times daily. 15 mL 11   cetirizine (ZYRTEC) 10 MG tablet Take 10 mg by mouth daily as needed for allergies.     Cholecalciferol  (VITAMIN D3) 50 MCG (2000 UT) capsule Take 1 capsule (2,000 Units total) by mouth daily.     Fiber Adult Gummies 2 g CHEW      Iron -FA-B Cmp-C-Biot-Probiotic (FUSION PLUS) CAPS Take 1 capsule by mouth daily at 8 pm. 90 capsule 1   linaclotide  (LINZESS ) 145 MCG CAPS capsule Take 1 capsule (145 mcg total) by mouth daily before breakfast. 90 capsule 3   tirzepatide  (ZEPBOUND ) 15 MG/0.5ML Pen  Inject 15 mg into the skin once a week. 6 mL 0   No current facility-administered medications for this visit.     SABRA  PHYSICAL EXAMINATION:   Vitals:   12/31/23 1431  BP: 120/74  Pulse: 92  Resp: 20  Temp: 97.6 F (36.4 C)  SpO2: 100%    Filed Weights   12/31/23 1431  Weight: 194 lb 3.2 oz (88.1 kg)     Physical Exam Vitals and nursing note reviewed.  HENT:     Head: Normocephalic and atraumatic.     Mouth/Throat:     Pharynx: Oropharynx is clear.  Eyes:     Extraocular Movements: Extraocular movements intact.     Pupils: Pupils are equal, round, and reactive to light.  Cardiovascular:     Rate and Rhythm: Normal rate and regular rhythm.  Pulmonary:     Comments: Decreased breath sounds bilaterally.  Abdominal:     Palpations: Abdomen is soft.  Musculoskeletal:        General: Normal range of motion.     Cervical back: Normal range of motion.  Skin:    General: Skin is warm.  Neurological:     General: No focal deficit present.     Mental Status: She is alert and oriented to person, place, and time.  Psychiatric:        Behavior: Behavior normal.        Judgment: Judgment normal.      LABORATORY DATA:  I have reviewed the data as listed Lab Results  Component Value Date   WBC 6.6 12/27/2023   HGB 12.9 12/27/2023   HCT 38.4 12/27/2023   MCV 89.9 12/27/2023   PLT 180 12/27/2023   Recent Labs    12/08/23 0745  NA 141  K 4.4  CL 105  CO2 29  GLUCOSE 99  BUN 15  CREATININE 0.67  CALCIUM 9.2  PROT 7.1  ALBUMIN 4.1  AST 24  ALT 33  ALKPHOS 63  BILITOT 0.5      MM 3D DIAGNOSTIC MAMMOGRAM BILATERAL BREAST Result Date: 12/31/2023 CLINICAL DATA:  Two year follow-up of mammographically detected mass, without sonographic abnormality. Due for annual EXAM: DIGITAL DIAGNOSTIC BILATERAL  MAMMOGRAM WITH TOMOSYNTHESIS AND CAD TECHNIQUE: Bilateral digital diagnostic mammography and breast tomosynthesis was performed. The images were evaluated with  computer-aided detection. COMPARISON:  Previous exam(s). ACR Breast Density Category c: The breasts are heterogeneously dense, which may obscure small masses. FINDINGS: RIGHT breast mass described on the prior examinations is stable compared to the most recent exam in September 2024, and decreased in size compared to more remote prior examinations, compatible with a benign etiology. There is no mammographic evidence of malignancy in EITHER breast. IMPRESSION: The RIGHT breast mass described on prior examinations is stable from September 2024 and decreased since August 2023, compatible with a benign etiology, likely reflecting a cyst. There is no mammographic evidence of malignancy in EITHER breast. Patient may return to routine screening mammogram. RECOMMENDATION: Patient may return to routine screening mammogram in 1 year.(Code:SM-B-01Y) I have discussed the findings and recommendations with the patient. If applicable, a reminder letter will be sent to the patient regarding the next appointment. BI-RADS CATEGORY  2: Benign. Electronically Signed   By: Norleen Croak M.D.   On: 12/31/2023 10:51    ASSESSMENT & PLAN:   Symptomatic anemia # Anemia- Hb-symptomatic.  Likely due to iron  deficiency - from etiology menorrhagia.  Currently maintenance PO iron  verye other day.   # s/p IV iron -hemoglobin is improved.  HOLD Venofer  today.   continue -  fusion iron .    #Etiology of iron  deficiency: Likely secondary to menorrhagia [Dr.jackson].  EGD colonoscopy capsule- ? 2023- NEG.  #Since patient is clinically stable I think is reasonable for the patient to follow-up with PCP/can follow-up with us  as needed.  Patient comfortable with the plan; to call us  if any questions or concerns in the interim.  # DISPOSITION: # HOLD  venofer   today # follow up as needed- -Dr.B   All questions were answered. The patient knows to call the clinic with any problems, questions or concerns.   Cindy JONELLE Joe,  MD 12/31/2023 3:03 PM

## 2023-12-31 NOTE — Assessment & Plan Note (Addendum)
#   Anemia- Hb-symptomatic.  Likely due to iron  deficiency - from etiology menorrhagia.  Currently maintenance PO iron  verye other day.   # s/p IV iron -hemoglobin is improved.  HOLD Venofer  today.   continue -  fusion iron .    #Etiology of iron  deficiency: Likely secondary to menorrhagia [Dr.jackson].  EGD colonoscopy capsule- ? 2023- NEG.  #Since patient is clinically stable I think is reasonable for the patient to follow-up with PCP/can follow-up with us  as needed.  Patient comfortable with the plan; to call us  if any questions or concerns in the interim.  # DISPOSITION: # HOLD  venofer   today # follow up as needed- -Dr.B

## 2024-02-04 ENCOUNTER — Encounter: Payer: Self-pay | Admitting: Internal Medicine

## 2024-02-04 ENCOUNTER — Other Ambulatory Visit: Payer: Self-pay

## 2024-02-07 ENCOUNTER — Other Ambulatory Visit: Payer: Self-pay

## 2024-02-10 ENCOUNTER — Other Ambulatory Visit: Payer: Self-pay | Admitting: Primary Care

## 2024-02-10 ENCOUNTER — Other Ambulatory Visit: Payer: Self-pay

## 2024-02-10 ENCOUNTER — Other Ambulatory Visit (HOSPITAL_COMMUNITY): Payer: Self-pay

## 2024-02-10 DIAGNOSIS — Z6829 Body mass index (BMI) 29.0-29.9, adult: Secondary | ICD-10-CM

## 2024-02-10 MED ORDER — ZEPBOUND 15 MG/0.5ML ~~LOC~~ SOAJ
15.0000 mg | SUBCUTANEOUS | 0 refills | Status: AC
  Filled 2024-02-10: qty 2, 28d supply, fill #0
  Filled 2024-04-06: qty 2, 28d supply, fill #1

## 2024-02-11 ENCOUNTER — Other Ambulatory Visit: Payer: Self-pay

## 2024-02-23 MED FILL — Linaclotide Cap 145 MCG: ORAL | 90 days supply | Qty: 90 | Fill #2 | Status: AC

## 2024-02-24 ENCOUNTER — Other Ambulatory Visit: Payer: Self-pay

## 2024-02-24 ENCOUNTER — Encounter: Payer: Self-pay | Admitting: Internal Medicine

## 2024-02-25 ENCOUNTER — Other Ambulatory Visit: Payer: Self-pay

## 2024-05-08 ENCOUNTER — Other Ambulatory Visit: Payer: Self-pay

## 2024-05-31 ENCOUNTER — Ambulatory Visit: Admitting: Primary Care

## 2024-12-08 ENCOUNTER — Encounter: Admitting: Primary Care

## 2024-12-13 ENCOUNTER — Encounter: Admitting: Primary Care
